# Patient Record
Sex: Female | Born: 1970 | Race: White | Hispanic: No | Marital: Married | Smoking: Former smoker
Health system: Southern US, Community
[De-identification: ages and names within clinical notes are randomized; demographics above are authoritative.]

## PROBLEM LIST (undated history)

## (undated) DIAGNOSIS — K219 Gastro-esophageal reflux disease without esophagitis: Secondary | ICD-10-CM

## (undated) DIAGNOSIS — O24419 Gestational diabetes mellitus in pregnancy, unspecified control: Secondary | ICD-10-CM

## (undated) DIAGNOSIS — IMO0002 Reserved for concepts with insufficient information to code with codable children: Secondary | ICD-10-CM

## (undated) DIAGNOSIS — T7840XA Allergy, unspecified, initial encounter: Secondary | ICD-10-CM

## (undated) DIAGNOSIS — R87619 Unspecified abnormal cytological findings in specimens from cervix uteri: Secondary | ICD-10-CM

## (undated) DIAGNOSIS — M1990 Unspecified osteoarthritis, unspecified site: Secondary | ICD-10-CM

## (undated) DIAGNOSIS — M8580 Other specified disorders of bone density and structure, unspecified site: Secondary | ICD-10-CM

## (undated) DIAGNOSIS — R7303 Prediabetes: Secondary | ICD-10-CM

## (undated) DIAGNOSIS — O09529 Supervision of elderly multigravida, unspecified trimester: Secondary | ICD-10-CM

## (undated) HISTORY — DX: Supervision of elderly multigravida, unspecified trimester: O09.529

## (undated) HISTORY — DX: Allergy, unspecified, initial encounter: T78.40XA

## (undated) HISTORY — DX: Prediabetes: R73.03

## (undated) HISTORY — DX: Other specified disorders of bone density and structure, unspecified site: M85.80

## (undated) HISTORY — PX: LEEP: SHX91

## (undated) HISTORY — DX: Unspecified osteoarthritis, unspecified site: M19.90

## (undated) HISTORY — PX: TUMOR REMOVAL: SHX12

## (undated) HISTORY — PX: APPENDECTOMY: SHX54

## (undated) HISTORY — PX: KNEE SURGERY: SHX244

---

## 2000-10-05 ENCOUNTER — Other Ambulatory Visit: Admission: RE | Admit: 2000-10-05 | Discharge: 2000-10-05 | Payer: Self-pay | Admitting: Obstetrics and Gynecology

## 2000-11-13 ENCOUNTER — Encounter (INDEPENDENT_AMBULATORY_CARE_PROVIDER_SITE_OTHER): Payer: Self-pay | Admitting: Specialist

## 2000-11-13 ENCOUNTER — Other Ambulatory Visit: Admission: RE | Admit: 2000-11-13 | Discharge: 2000-11-13 | Payer: Self-pay | Admitting: Obstetrics and Gynecology

## 2000-12-21 ENCOUNTER — Other Ambulatory Visit: Admission: RE | Admit: 2000-12-21 | Discharge: 2000-12-21 | Payer: Self-pay | Admitting: Obstetrics and Gynecology

## 2000-12-21 ENCOUNTER — Encounter (INDEPENDENT_AMBULATORY_CARE_PROVIDER_SITE_OTHER): Payer: Self-pay | Admitting: Specialist

## 2001-03-20 ENCOUNTER — Other Ambulatory Visit: Admission: RE | Admit: 2001-03-20 | Discharge: 2001-03-20 | Payer: Self-pay | Admitting: Obstetrics and Gynecology

## 2001-07-09 ENCOUNTER — Other Ambulatory Visit: Admission: RE | Admit: 2001-07-09 | Discharge: 2001-07-09 | Payer: Self-pay | Admitting: Obstetrics and Gynecology

## 2001-11-12 ENCOUNTER — Other Ambulatory Visit: Admission: RE | Admit: 2001-11-12 | Discharge: 2001-11-12 | Payer: Self-pay | Admitting: Obstetrics and Gynecology

## 2002-01-28 ENCOUNTER — Other Ambulatory Visit: Admission: RE | Admit: 2002-01-28 | Discharge: 2002-01-28 | Payer: Self-pay | Admitting: Obstetrics and Gynecology

## 2002-09-16 ENCOUNTER — Other Ambulatory Visit: Admission: RE | Admit: 2002-09-16 | Discharge: 2002-09-16 | Payer: Self-pay | Admitting: Obstetrics and Gynecology

## 2003-03-03 ENCOUNTER — Emergency Department (HOSPITAL_COMMUNITY): Admission: EM | Admit: 2003-03-03 | Discharge: 2003-03-03 | Payer: Self-pay | Admitting: Emergency Medicine

## 2003-03-03 ENCOUNTER — Encounter: Payer: Self-pay | Admitting: Internal Medicine

## 2003-03-04 ENCOUNTER — Other Ambulatory Visit: Admission: RE | Admit: 2003-03-04 | Discharge: 2003-03-04 | Payer: Self-pay | Admitting: Obstetrics and Gynecology

## 2003-04-04 ENCOUNTER — Encounter (INDEPENDENT_AMBULATORY_CARE_PROVIDER_SITE_OTHER): Payer: Self-pay | Admitting: *Deleted

## 2003-04-04 ENCOUNTER — Ambulatory Visit (HOSPITAL_COMMUNITY): Admission: RE | Admit: 2003-04-04 | Discharge: 2003-04-04 | Payer: Self-pay | Admitting: Obstetrics and Gynecology

## 2003-04-07 ENCOUNTER — Inpatient Hospital Stay (HOSPITAL_COMMUNITY): Admission: AD | Admit: 2003-04-07 | Discharge: 2003-04-07 | Payer: Self-pay | Admitting: Obstetrics and Gynecology

## 2003-04-07 ENCOUNTER — Encounter: Payer: Self-pay | Admitting: Obstetrics and Gynecology

## 2003-05-21 ENCOUNTER — Encounter: Payer: Self-pay | Admitting: Emergency Medicine

## 2003-05-21 ENCOUNTER — Ambulatory Visit (HOSPITAL_COMMUNITY): Admission: RE | Admit: 2003-05-21 | Discharge: 2003-05-21 | Payer: Self-pay | Admitting: Emergency Medicine

## 2003-08-28 ENCOUNTER — Inpatient Hospital Stay (HOSPITAL_COMMUNITY): Admission: AD | Admit: 2003-08-28 | Discharge: 2003-08-28 | Payer: Self-pay | Admitting: *Deleted

## 2003-11-02 ENCOUNTER — Emergency Department (HOSPITAL_COMMUNITY): Admission: EM | Admit: 2003-11-02 | Discharge: 2003-11-02 | Payer: Self-pay | Admitting: Emergency Medicine

## 2003-11-03 ENCOUNTER — Encounter: Admission: RE | Admit: 2003-11-03 | Discharge: 2003-11-03 | Payer: Self-pay | Admitting: Specialist

## 2003-11-04 ENCOUNTER — Emergency Department (HOSPITAL_COMMUNITY): Admission: AD | Admit: 2003-11-04 | Discharge: 2003-11-04 | Payer: Self-pay | Admitting: Family Medicine

## 2004-03-05 ENCOUNTER — Ambulatory Visit (HOSPITAL_COMMUNITY): Admission: RE | Admit: 2004-03-05 | Discharge: 2004-03-05 | Payer: Self-pay | Admitting: Urology

## 2004-03-05 ENCOUNTER — Ambulatory Visit (HOSPITAL_BASED_OUTPATIENT_CLINIC_OR_DEPARTMENT_OTHER): Admission: RE | Admit: 2004-03-05 | Discharge: 2004-03-05 | Payer: Self-pay | Admitting: Urology

## 2004-03-15 ENCOUNTER — Other Ambulatory Visit: Admission: RE | Admit: 2004-03-15 | Discharge: 2004-03-15 | Payer: Self-pay | Admitting: Obstetrics and Gynecology

## 2004-11-05 ENCOUNTER — Ambulatory Visit (HOSPITAL_COMMUNITY): Admission: RE | Admit: 2004-11-05 | Discharge: 2004-11-05 | Payer: Self-pay | Admitting: Obstetrics and Gynecology

## 2005-04-04 ENCOUNTER — Other Ambulatory Visit: Admission: RE | Admit: 2005-04-04 | Discharge: 2005-04-04 | Payer: Self-pay | Admitting: Obstetrics and Gynecology

## 2007-08-13 ENCOUNTER — Encounter (INDEPENDENT_AMBULATORY_CARE_PROVIDER_SITE_OTHER): Payer: Self-pay | Admitting: General Surgery

## 2007-08-13 ENCOUNTER — Ambulatory Visit (HOSPITAL_BASED_OUTPATIENT_CLINIC_OR_DEPARTMENT_OTHER): Admission: RE | Admit: 2007-08-13 | Discharge: 2007-08-13 | Payer: Self-pay | Admitting: General Surgery

## 2008-09-03 ENCOUNTER — Ambulatory Visit: Payer: Self-pay | Admitting: Family Medicine

## 2008-09-05 ENCOUNTER — Telehealth (INDEPENDENT_AMBULATORY_CARE_PROVIDER_SITE_OTHER): Payer: Self-pay | Admitting: *Deleted

## 2008-09-05 ENCOUNTER — Emergency Department (HOSPITAL_COMMUNITY): Admission: EM | Admit: 2008-09-05 | Discharge: 2008-09-05 | Payer: Self-pay | Admitting: Emergency Medicine

## 2008-09-24 ENCOUNTER — Ambulatory Visit (HOSPITAL_COMMUNITY): Admission: RE | Admit: 2008-09-24 | Discharge: 2008-09-24 | Payer: Self-pay | Admitting: Neurosurgery

## 2008-11-06 ENCOUNTER — Ambulatory Visit: Payer: Self-pay | Admitting: Family Medicine

## 2009-06-17 ENCOUNTER — Ambulatory Visit: Payer: Self-pay | Admitting: Family Medicine

## 2009-06-17 LAB — CONVERTED CEMR LAB
Bilirubin Urine: NEGATIVE
Blood in Urine, dipstick: NEGATIVE
Glucose, Urine, Semiquant: NEGATIVE
Ketones, urine, test strip: NEGATIVE
Nitrite: NEGATIVE
Protein, U semiquant: NEGATIVE
Specific Gravity, Urine: 1.01
Urobilinogen, UA: 0.2
WBC Urine, dipstick: NEGATIVE
pH: 7.5

## 2009-08-07 ENCOUNTER — Encounter (INDEPENDENT_AMBULATORY_CARE_PROVIDER_SITE_OTHER): Payer: Self-pay | Admitting: Emergency Medicine

## 2009-08-07 ENCOUNTER — Emergency Department (HOSPITAL_COMMUNITY): Admission: EM | Admit: 2009-08-07 | Discharge: 2009-08-07 | Payer: Self-pay | Admitting: Emergency Medicine

## 2009-08-07 ENCOUNTER — Ambulatory Visit: Payer: Self-pay | Admitting: Surgery

## 2009-08-07 ENCOUNTER — Ambulatory Visit: Payer: Self-pay | Admitting: Family Medicine

## 2009-08-07 DIAGNOSIS — M79609 Pain in unspecified limb: Secondary | ICD-10-CM | POA: Insufficient documentation

## 2009-09-18 ENCOUNTER — Ambulatory Visit (HOSPITAL_COMMUNITY): Admission: RE | Admit: 2009-09-18 | Discharge: 2009-09-18 | Payer: Self-pay | Admitting: Obstetrics and Gynecology

## 2009-09-18 ENCOUNTER — Encounter (INDEPENDENT_AMBULATORY_CARE_PROVIDER_SITE_OTHER): Payer: Self-pay | Admitting: Obstetrics and Gynecology

## 2009-12-22 ENCOUNTER — Ambulatory Visit: Payer: Self-pay | Admitting: Family Medicine

## 2009-12-23 LAB — CONVERTED CEMR LAB
ALT: 22 units/L (ref 0–35)
AST: 19 units/L (ref 0–37)
Albumin: 4 g/dL (ref 3.5–5.2)
Alkaline Phosphatase: 48 units/L (ref 39–117)
BUN: 11 mg/dL (ref 6–23)
CO2: 22 meq/L (ref 19–32)
Calcium: 8.5 mg/dL (ref 8.4–10.5)
Chloride: 102 meq/L (ref 96–112)
Cholesterol: 189 mg/dL (ref 0–200)
Creatinine, Ser: 0.58 mg/dL (ref 0.40–1.20)
Glucose, Bld: 89 mg/dL (ref 70–99)
HDL: 43 mg/dL (ref >39)
LDL Cholesterol: 98 mg/dL (ref 0–99)
Potassium: 4 meq/L (ref 3.5–5.3)
Sodium: 137 meq/L (ref 135–145)
TSH: 1.168 microintl units/mL (ref 0.350–4.500)
Total Bilirubin: 0.4 mg/dL (ref 0.3–1.2)
Total CHOL/HDL Ratio: 4.4
Total Protein: 7 g/dL (ref 6.0–8.3)
Triglycerides: 238 mg/dL — ABNORMAL HIGH (ref <150)
VLDL: 48 mg/dL — ABNORMAL HIGH (ref 0–40)

## 2010-02-11 ENCOUNTER — Ambulatory Visit: Payer: Self-pay | Admitting: Family Medicine

## 2010-02-11 DIAGNOSIS — R42 Dizziness and giddiness: Secondary | ICD-10-CM | POA: Insufficient documentation

## 2010-02-11 LAB — CONVERTED CEMR LAB: Beta hcg, urine, semiquantitative: NEGATIVE

## 2010-02-12 ENCOUNTER — Encounter: Payer: Self-pay | Admitting: Family Medicine

## 2010-02-12 LAB — CONVERTED CEMR LAB
ALT: 50 units/L — ABNORMAL HIGH (ref 0–35)
AST: 34 units/L (ref 0–37)
Albumin: 4.8 g/dL (ref 3.5–5.2)
Alkaline Phosphatase: 54 units/L (ref 39–117)
BUN: 10 mg/dL (ref 6–23)
Basophils Absolute: 0.1 10*3/uL (ref 0.0–0.1)
Basophils Relative: 1 % (ref 0–1)
CO2: 28 meq/L (ref 19–32)
Calcium: 9.2 mg/dL (ref 8.4–10.5)
Chloride: 103 meq/L (ref 96–112)
Creatinine, Ser: 0.62 mg/dL (ref 0.40–1.20)
Eosinophils Absolute: 0.2 10*3/uL (ref 0.0–0.7)
Eosinophils Relative: 3 % (ref 0–5)
Glucose, Bld: 112 mg/dL — ABNORMAL HIGH (ref 70–99)
HCT: 42.2 % (ref 36.0–46.0)
Hemoglobin: 13.9 g/dL (ref 12.0–15.0)
Lymphocytes Relative: 44 % (ref 12–46)
Lymphs Abs: 2.8 10*3/uL (ref 0.7–4.0)
MCHC: 32.9 g/dL (ref 30.0–36.0)
MCV: 88.3 fL (ref 78.0–100.0)
Monocytes Absolute: 0.5 10*3/uL (ref 0.1–1.0)
Monocytes Relative: 8 % (ref 3–12)
Neutro Abs: 2.8 10*3/uL (ref 1.7–7.7)
Neutrophils Relative %: 45 % (ref 43–77)
Platelets: 198 10*3/uL (ref 150–400)
Potassium: 4 meq/L (ref 3.5–5.3)
Preg, Serum: NEGATIVE
RBC: 4.78 M/uL (ref 3.87–5.11)
RDW: 12.5 % (ref 11.5–15.5)
Sodium: 140 meq/L (ref 135–145)
Total Bilirubin: 0.3 mg/dL (ref 0.3–1.2)
Total Protein: 8 g/dL (ref 6.0–8.3)
WBC: 6.3 10*3/uL (ref 4.0–10.5)

## 2010-02-15 LAB — CONVERTED CEMR LAB
HCV Ab: NEGATIVE
Hep A IgM: NEGATIVE
Hep B C IgM: NEGATIVE
Hepatitis B Surface Ag: NEGATIVE

## 2010-03-08 ENCOUNTER — Ambulatory Visit: Payer: Self-pay | Admitting: Family Medicine

## 2010-03-08 DIAGNOSIS — E785 Hyperlipidemia, unspecified: Secondary | ICD-10-CM | POA: Insufficient documentation

## 2010-03-08 DIAGNOSIS — E781 Pure hyperglyceridemia: Secondary | ICD-10-CM | POA: Insufficient documentation

## 2010-03-25 ENCOUNTER — Ambulatory Visit: Payer: Self-pay | Admitting: Family Medicine

## 2010-03-25 DIAGNOSIS — J209 Acute bronchitis, unspecified: Secondary | ICD-10-CM | POA: Insufficient documentation

## 2010-03-30 ENCOUNTER — Telehealth: Payer: Self-pay | Admitting: Family Medicine

## 2010-03-31 ENCOUNTER — Encounter: Admission: RE | Admit: 2010-03-31 | Discharge: 2010-03-31 | Payer: Self-pay | Admitting: Family Medicine

## 2010-12-28 NOTE — Assessment & Plan Note (Signed)
Summary: Dizziness   Vital Signs:  Patient profile:   40 year old female Height:      64 inches Weight:      177 pounds Pulse rate:   96 / minute Pulse (ortho):   104 / minute BP sitting:   125 / 86  (left arm) BP standing:   144 / 98 Cuff size:   regular  Vitals Entered By: Kathlene November (February 11, 2010 4:16 PM)  Serial Vital Signs/Assessments:  Time      Position  BP       Pulse  Resp  Temp     By 4:18 PM   Lying LA  129/85   83                    Kim Johnson 4:18 PM   Sitting   132/95   100                   Kim Johnson 4:18 PM   Standing  144/98   104                   Kathlene November  CC: dizziness, nausea, H/A elevated BP reading before lunch 142/101 p=119   Primary Care Provider:  Nani Gasser, MD  CC:  dizziness, nausea, and H/A elevated BP reading before lunch 142/101 p=119.  History of Present Illness: dizziness, nausea, H/A elevated BP reading before lunch 142/101 p=119.    Started on Sunday. Bp has been running high all week.  Had last period 2 months ago. Not taking any OTC meds or NSAIDs.  Ony using fish oil and MVI.  Feels nauseated in the AM. Feels better afer she eats.  Bupring alot.  More heartburn recently.  Felt dizzy when turned her head to the right while driving.  No ear pain or pressuer.  When gets a HA, her ears hurts and sometimes feels a pressure in her head. .  When walking feels like she if floating.  She started smoking about 1 months ago. No fever or recent URI sxs. Ovulated on  March 5th so wonders if she could be pregnant.   Current Medications (verified): 1)  None  Allergies (verified): 1)  ! * Mobic 2)  Sudafed  Comments:  Nurse/Medical Assistant: The patient's medications and allergies were reviewed with the patient and were updated in the Medication and Allergy Lists. Kathlene November (February 11, 2010 4:17 PM)  Past History:  Family History: Last updated: 11/06/2008 father CAD, high cholesterol mother healthy 2 sisters  healthy  Physical Exam  General:  Well-developed,well-nourished,in no acute distress; alert,appropriate and cooperative throughout examination Head:  Normocephalic and atraumatic without obvious abnormalities. No apparent alopecia or balding. Eyes:  No corneal or conjunctival inflammation noted. EOMI. Perrla.  Ears:  External ear exam shows no significant lesions or deformities.  Otoscopic examination reveals clear canals, tympanic membranes are intact bilaterally without bulging, retraction, inflammation or discharge. Hearing is grossly normal bilaterally. Nose:  External nasal examination shows no deformity or inflammation.  Mouth:  Oral mucosa and oropharynx without lesions or exudates.  Teeth in good repair. Lungs:  Normal respiratory effort, chest expands symmetrically. Lungs are clear to auscultation, no crackles or wheezes. Heart:  Normal rate and regular rhythm. S1 and S2 normal without gallop, murmur, click, rub or other extra sounds. Abdomen:  Bowel sounds positive,abdomen soft and non-tender without masses, organomegaly or hernias noted. Neurologic:  alert & oriented X3, cranial nerves II-XII intact,  and gait normal.  Neg Diks-Hallpike manuver.  Skin:  no rashes.   Cervical Nodes:  No lymphadenopathy noted Psych:  Cognition and judgment appear intact. Alert and cooperative with normal attention span and concentration. No apparent delusions, illusions, hallucinations   Impression & Recommendations:  Problem # 1:  DIZZINESS (ICD-780.4) Encouraged her to quit smoking.  Orthosatics are normal. Neg Diks hallpike. I think it is related to her High BP but not sure why her BP is elevated when it was normal at her last viisit. Will check labs to rule out infeciton or electrolyte imbalance. Had normal TSH 2 mo ago.  She is not takng any meds that should inc her BP.  Consider sinusitis thought certainly sxs not classic.  Will call wiht labs results.   Orders: T-CBC w/Diff  ([id number]) T-Comprehensive Metabolic Panel [id number]) T- * Misc. Laboratory test ([id number])  Other Orders: Urine Pregnancy Test  [id number])  Laboratory Results   Urine Tests  Date/Time Received: 02/11/2010 Date/Time Reported: 02/11/2010    Urine HCG: negative

## 2010-12-28 NOTE — Letter (Signed)
Summary: Out of Work  [facility]  [address], [address]   [place], [place] [address]   Phone: [phone]  Fax: [phone]    February 11, 2010   Employee:  PEARLEE ARVIZU    To Whom It May Concern:   For Medical reasons, please excuse the above named employee from work for the following dates:  Start:   02-11-2010  End:   02-15-2010  If you need additional information, please feel free to contact our office.         Sincerely,    Nani Gasser MD

## 2010-12-28 NOTE — Assessment & Plan Note (Signed)
Summary: Bronchitis   Vital Signs:  Patient profile:   40 year old female Height:      64 inches Weight:      174 pounds BMI:     29.97 O2 Sat:      97 % on Room air Temp:     98.5 degrees F oral Pulse rate:   130 / minute BP sitting:   114 / 78  (left arm) Cuff size:   regular  Vitals Entered By: Kathlene November (March 25, 2010 9:57 AM)  O2 Flow:  Room air CC: cough, H/A, facial pressure, sweats, when coughs her back hurts in lung area. Started on Monday   Primary Care Provider:  Nani Gasser, MD  CC:  cough, H/A, facial pressure, sweats, and when coughs her back hurts in lung area. Started on Monday.  History of Present Illness: cough, H/A, facial pressure, sweats, when coughs her back hurts in lung area. Started on Monday ( 4 days ago). Mild runny nose. Not sure if true fever.  Mild ST.  Last night had some loose stools x 3.  Took some loratidine and albuterol and did topical menthol. Some SOB with the cough and with activity.   Current Medications (verified): 1)  None  Allergies (verified): 1)  ! * Mobic 2)  Sudafed  Comments:  Nurse/Medical Assistant: The patient's medications and allergies were reviewed with the patient and were updated in the Medication and Allergy Lists. Kathlene November (March 25, 2010 9:59 AM)  Physical Exam  General:  Well-developed,well-nourished,in no acute distress; alert,appropriate and cooperative throughout examination. Hs a deep vibratory cough.  Head:  Normocephalic and atraumatic without obvious abnormalities. No apparent alopecia or balding. Eyes:  No corneal or conjunctival inflammation noted. EOMI. Perrla. Ears:  External ear exam shows no significant lesions or deformities.  Otoscopic examination reveals clear canals, tympanic membranes are intact bilaterally without bulging, retraction, inflammation or discharge. Hearing is grossly normal bilaterally. Nose:  External nasal examination shows no deformity or inflammation. Nasal  mucosa are pink and moist without lesions or exudates. Mouth:  Oral mucosa and oropharynx without lesions or exudates.  Teeth in good repair. Neck:  No deformities, masses, or tenderness noted. Lungs:  Normal respiratory effort, chest expands symmetrically. Coarse BS bilat. No wheeze or crackles. Heart:  Normal rate and regular rhythm. S1 and S2 normal without gallop, murmur, click, rub or other extra sounds. Pulses:  Radial 2+  Skin:  no rashes.   Cervical Nodes:  No lymphadenopathy noted Psych:  Cognition and judgment appear intact. Alert and cooperative with normal attention span and concentration. No apparent delusions, illusions, hallucinations   Impression & Recommendations:  Problem # 1:  BRONCHITIS, ACUTE (ICD-466.0) Explained that this could also be viral in which case the ABS will not help her sxs.  Her updated medication list for this problem includes:    Zithromax Z-pak 250 Mg Tabs (Azithromycin) .Marland Kitchen... Take as directed.    Xopenex Hfa 45 Mcg/act Aero (Levalbuterol tartrate) .Marland Kitchen... 2-4 puffs inhaled every 4-6 hours as needed    Hydrocodone-homatropine 5-1.5 Mg/29ml Syrp (Hydrocodone-homatropine) .Marland KitchenMarland KitchenMarland KitchenMarland Kitchen 5ml by mouth at bedtime as needed for cough  Take antibiotics and other medications as directed. Encouraged to push clear liquids, get enough rest, and take acetaminophen as needed. To be seen in 5-7 days if no improvement, sooner if worse.  Complete Medication List: 1)  Zithromax Z-pak 250 Mg Tabs (Azithromycin) .... Take as directed. 2)  Xopenex Hfa 45 Mcg/act Aero (Levalbuterol tartrate) .... 2-4 puffs  inhaled every 4-6 hours as needed 3)  Hydrocodone-homatropine 5-1.5 Mg/79ml Syrp (Hydrocodone-homatropine) .... 5ml by mouth at bedtime as needed for cough  Patient Instructions: 1)  If not better in one week, please call us.   Prescriptions: HYDROCODONE-HOMATROPINE 5-1.5 MG/5ML SYRP (HYDROCODONE-HOMATROPINE) 5ml by mouth at bedtime as needed for cough  #132ml x 0   Entered and  Authorized by:   Nani Gasser MD   Signed by:   Nani Gasser MD on 03/25/2010   Method used:   Printed then faxed to ...       [facility]  [address] [phone]* (retail)       [address] [place], [place]  [address]       Ph: [phone] or [phone]       Fax: [phone]   RxID:   [id number] [facility] HFA 45 MCG/ACT AERO (LEVALBUTEROL TARTRATE) 2-4 puffs inhaled every 4-6 hours as needed  #1 x 0   Entered and Authorized by:   Nani Gasser MD   Signed by:   Nani Gasser MD on 03/25/2010   Method used:   Electronically to        [facility] [phone]* (retail)       [address]       [place], [place]  [address]       Ph: [phone] or [phone]       Fax: [phone]   RxID:   [id number] ZITHROMAX Z-PAK 250 MG TABS (AZITHROMYCIN) Take as directed.  #1 pack x 0   Entered and Authorized by:   Nani Gasser MD   Signed by:   Nani Gasser MD on 03/25/2010   Method used:   [facility]ronically to        [facility] [phone]* (retail)       [address]       [place], [place]  [address]       Ph: [phone] or [phone]       Fax: [phone]   RxID:   [id number]

## 2010-12-28 NOTE — Assessment & Plan Note (Signed)
Summary: CPE   Vital Signs:  Patient profile:   40 year old female Height:      64 inches Weight:      175 pounds Pulse rate:   93 / minute BP sitting:   99 / 66  (left arm) Cuff size:   regular  Vitals Entered By: Kathlene November (December 22, 2009 8:22 AM) CC: CPE no pap   Primary Care Provider:  Nani Gasser, MD  CC:  CPE no pap.  History of Present Illness: Has eye exam scheduled in March, 2011. Hx of uterine polyps- benign. Several removed last year.  She is currently seeing an infertility specialist to try to get pregnant. Take occ takes a MVI but admits she is not very consistant with it.   Current Medications (verified): 1)  None  Allergies (verified): 1)  ! * Mobic 2)  Sudafed  Comments:  Nurse/Medical Assistant: The patient's medications and allergies were reviewed with the patient and were updated in the Medication and Allergy Lists. Kathlene November (December 22, 2009 8:23 AM)  Past History:  Past Surgical History: Last updated: 11/06/2008 R knee surgery appendectomy fatty tumor removed.  Family History: Last updated: 11/06/2008 father CAD, high cholesterol mother healthy 2 sisters healthy  Past Medical History: borderline cholesterol G1P0  Gyn - Dr. Huntley Dec - hx of utereine polyps -benign.   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  Quit smoking 2000. Rare ETOH. Exercises rarely.    Review of Systems       The patient complains of vision loss.  The patient denies anorexia, fever, weight loss, weight gain, decreased hearing, hoarseness, chest pain, syncope, dyspnea on exertion, peripheral edema, prolonged cough, headaches, hemoptysis, abdominal pain, melena, hematochezia, severe indigestion/heartburn, hematuria, incontinence, genital sores, muscle weakness, suspicious skin lesions, transient blindness, difficulty walking, depression, unusual weight change, abnormal bleeding, enlarged lymph  nodes, angioedema, breast masses, and testicular masses.    Physical Exam  General:  Well-developed,well-nourished,in no acute distress; alert,appropriate and cooperative throughout examination Head:  Normocephalic and atraumatic without obvious abnormalities. No apparent alopecia or balding. Eyes:  No corneal or conjunctival inflammation noted. EOMI. Perrla. Ears:  External ear exam shows no significant lesions or deformities.  Otoscopic examination reveals clear canals, tympanic membranes are intact bilaterally without bulging, retraction, inflammation or discharge. Hearing is grossly normal bilaterally. Nose:  External nasal examination shows no deformity or inflammation. Nasal mucosa are pink and moist without lesions or exudates. Mouth:  Oral mucosa and oropharynx without lesions or exudates.  Teeth in good repair.Mildy enlarged tonsils.  Neck:  No deformities, masses, or tenderness noted. Lungs:  Normal respiratory effort, chest expands symmetrically. Lungs are clear to auscultation, no crackles or wheezes. Heart:  Normal rate and regular rhythm. S1 and S2 normal without gallop, murmur, click, rub or other extra sounds. Abdomen:  Bowel sounds positive,abdomen soft and non-tender without masses, organomegaly or hernias noted. Msk:  No deformity or scoliosis noted of thoracic or lumbar spine.   Pulses:  R and L carotid,radial,dorsalis pedis and posterior tibial pulses are full and equal bilaterally Extremities:  No clubbing, cyanosis, edema, or deformity noted with normal full range of motion of all joints.   Neurologic:  No cranial nerve deficits noted. Station and gait are normal. DTRs are symmetrical throughout. Sensory, motor and coordinative functions appear intact. Skin:  no rashes.   Cervical Nodes:  No lymphadenopathy noted Psych:  Cognition and judgment appear intact. Alert and cooperative with  normal attention span and concentration. No apparent delusions, illusions,  hallucinations   Impression & Recommendations:  Problem # 1:  HEALTH MAINTENANCE EXAM (ICD-V70.0) Discussed really need Tdap but since she is actively trying to get pregnant will hold off. Encouraged her to be consistant with her MVI or PNV.  Due to recheck cholesterol with her history.   Due for screening labs. Will check cholesterol though if high not a candidate for statin since trying to get pregnant.  make sure getting adequate calcium intake and encourage more regular exercise. This will help with fertiliy as well.  Orders: T-Comprehensive Metabolic Panel [id number]) T-Lipid Profile [phone] [id number]) T-TSH [phone])

## 2010-12-28 NOTE — Assessment & Plan Note (Signed)
Summary: FU ON BP, LUQ pain/knot, etc   Vital Signs:  Patient profile:   40 year old female Height:      64 inches Weight:      177 pounds Pulse rate:   100 / minute BP sitting:   120 / 73  (left arm) Cuff size:   regular  Vitals Entered By: Kathlene November (March 08, 2010 3:44 PM) CC: followup BP, Hypertension Management   Primary Care Provider:  Nani Gasser, MD  CC:  followup BP and Hypertension Management.  History of Present Illness: Nticesa bulgeini the LUQ about 2 weeks ago. Only tender when preses on it.  Sticks out after she eats. Does have alot of heartburn.  Carries TUMS with her all the time. No change in bowel movements.  Can't eat spagetti or pizza. Avoiding caffeine since has the episode of HTN.   Hypertension History:      She denies headache, chest pain, palpitations, dyspnea with exertion, orthopnea, PND, peripheral edema, visual symptoms, neurologic problems, syncope, and side effects from treatment.  She notes no problems with any antihypertensive medication side effects.        Positive major cardiovascular risk factors include hyperlipidemia and hypertension.  Negative major cardiovascular risk factors include female age less than 16 years old and negative family history for ischemic heart disease.     Current Medications (verified): 1)  Hydrochlorothiazide 12.5 Mg Caps (Hydrochlorothiazide) .... Take 1 Tablet By Mouth Once A Day in The Am  Allergies (verified): 1)  ! * Mobic 2)  Sudafed  Comments:  Nurse/Medical Assistant: The patient's medications and allergies were reviewed with the patient and were updated in the Medication and Allergy Lists. Kathlene November (March 08, 2010 3:45 PM)  Social History: Reviewed history from 12/22/2009 and no changes required. 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  Quit smoking 2000. Rare ETOH. Exercises rarely.    Physical Exam  General:   Well-developed,well-nourished,in no acute distress; alert,appropriate and cooperative throughout examination Head:  Normocephalic and atraumatic without obvious abnormalities. No apparent alopecia or balding. Lungs:  Normal respiratory effort, chest expands symmetrically. Lungs are clear to auscultation, no crackles or wheezes. Heart:  Normal rate and regular rhythm. S1 and S2 normal without gallop, murmur, click, rub or other extra sounds. Abdomen:  soft, non-tender, normal bowel sounds, no distention, and no masses.   Skin:  no rashes.   Psych:  Cognition and judgment appear intact. Alert and cooperative with normal attention span and concentration. No apparent delusions, illusions, hallucinations   Impression & Recommendations:  Problem # 1:  DIZZINESS (ICD-780.4) Discussed that her BP looks great adn has always looked graet. Discused salt avoidance. will stop HCTZ for now and pt will follow her BPs at home. She is trying to get pregnant.   Problem # 2:  LUQ PAIN (ICD-789.02) Dsicussed likely GERD related as she does have alot of reflux signs. Trial of Protonoix for 10 days. Pt to call if not helping. Protonix is category B.   Problem # 3:  HYPERTRIGLYCERIDEMIA (ICD-272.1) Due to recheck since on Fish oil tabs now.  Orders: T-Triglycerides [id number])  Complete Medication List: 1)  Hydrochlorothiazide 12.5 Mg Caps (Hydrochlorothiazide) .... Take 1 tablet by mouth once a day in the am 2)  Protonix   Hypertension Assessment/Plan:      The patient's hypertensive risk group is category B: At least one risk factor (excluding diabetes) with no target organ damage.  Her calculated 10  year risk of coronary heart disease is 2 %.  Today's blood pressure is 120/73.  Her blood pressure goal is < 140/90.  Patient Instructions: 1)  Take the protonix about 20-30 minutes before breakfast once a day. Call if not better in 10 days.  2)  Keep an eye on blood pressure at home.

## 2010-12-28 NOTE — Progress Notes (Signed)
Summary: Cough still  Phone Note Call from Patient Call back at Work Phone [phone]   Caller: Patient Call For: Nani Gasser MD Summary of Call: Pt finished antibiotic yesterday. Cough is terrible during the day- dry cough- sounds like alot of head congestion still. Lungs are hurting. Cough med given at night works good during the night Initial call taken by: Kathlene November,  Mar 30, 2010 2:44 PM  Follow-up for Phone Call        Lets get CXRto rule out PNA>  Follow-up by: Nani Gasser MD,  Mar 30, 2010 3:03 PM  Additional Follow-up for Phone Call Additional follow up Details #1::        Pt notified Additional Follow-up by: Kathlene November,  Mar 30, 2010 3:13 PM

## 2011-01-19 LAB — ABO/RH: RH Type: POSITIVE

## 2011-01-19 LAB — RPR: RPR: NONREACTIVE

## 2011-01-19 LAB — HIV ANTIBODY (ROUTINE TESTING W REFLEX): HIV: NONREACTIVE

## 2011-01-19 LAB — RUBELLA ANTIBODY, IGM: Rubella: IMMUNE

## 2011-01-19 LAB — GC/CHLAMYDIA PROBE AMP, GENITAL
Chlamydia: NEGATIVE
Gonorrhea: NEGATIVE

## 2011-01-19 LAB — ANTIBODY SCREEN: Antibody Screen: NEGATIVE

## 2011-02-27 ENCOUNTER — Inpatient Hospital Stay (HOSPITAL_COMMUNITY): Admission: AD | Admit: 2011-02-27 | Payer: Self-pay | Source: Ambulatory Visit | Admitting: Obstetrics and Gynecology

## 2011-03-03 LAB — COMPREHENSIVE METABOLIC PANEL
ALT: 36 U/L — ABNORMAL HIGH (ref 0–35)
AST: 28 U/L (ref 0–37)
Albumin: 4 g/dL (ref 3.5–5.2)
Alkaline Phosphatase: 52 U/L (ref 39–117)
BUN: 9 mg/dL (ref 6–23)
CO2: 28 mEq/L (ref 19–32)
Calcium: 9.1 mg/dL (ref 8.4–10.5)
Chloride: 102 mEq/L (ref 96–112)
Creatinine, Ser: 0.49 mg/dL (ref 0.4–1.2)
GFR calc Af Amer: >60 mL/min (ref >60)
GFR calc non Af Amer: >60 mL/min (ref >60)
Glucose, Bld: 91 mg/dL (ref 70–99)
Potassium: 4.2 mEq/L (ref 3.5–5.1)
Sodium: 136 mEq/L (ref 135–145)
Total Bilirubin: 0.7 mg/dL (ref 0.3–1.2)
Total Protein: 7.4 g/dL (ref 6.0–8.3)

## 2011-03-03 LAB — CBC
HCT: 38.1 % (ref 36.0–46.0)
Hemoglobin: 13 g/dL (ref 12.0–15.0)
MCHC: 34.2 g/dL (ref 30.0–36.0)
MCV: 88.4 fL (ref 78.0–100.0)
Platelets: 194 10*3/uL (ref 150–400)
RBC: 4.31 MIL/uL (ref 3.87–5.11)
RDW: 12.8 % (ref 11.5–15.5)
WBC: 5.5 10*3/uL (ref 4.0–10.5)

## 2011-03-03 LAB — HCG, SERUM, QUALITATIVE: Preg, Serum: NEGATIVE

## 2011-04-12 NOTE — Op Note (Signed)
[name], Johnston NO.:  [id number]   MEDICAL RECORD NO.:  [id number]          PATIENT TYPE:  AMB   LOCATION:  [facility]                          FACILITY:  [facility]   PHYSICIAN:  Anselm Pancoast. Weatherly, M.D.DATE OF BIRTH:  24-Sep-1971   DATE OF PROCEDURE:  08/13/2007  DATE OF DISCHARGE:                               OPERATIVE REPORT   HISTORY:  Laura Johnston is a 40 year old female referred to me by Dr.  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  I measured it and it is definitely greater then 4  cm in size with a little raised area and I discussed with her I thought  I could remove with local anesthesia and would give her a little  sedation Valium 10 mg orally about an hour prior to the procedure.  She  was in agreement with this and she was also started on antibiotics,  Keflex, this morning because of the close proximity to the anus.  The  patient was taken, positioned on the OR table left side down, the right  knee brought up so we could kind of spread the buttocks and then we  prepped the area very wide with a solution so we could get the sterile  towels in this kind of difficult area to get to.  With the patient  comfortable, we then used about 15 mL of 1% Xylocaine with adrenaline  all total, first putting kind of a wheal around the area and then a  little incision where the actual incision would be and then made the  little incision.  She could not feel the incision and then separated  this lipoma that was definitely greater than 4 cm.  It was probably 7 or  8 cm in greatest dimension.  From the surrounding area, it goes down  close towards the anus but not actually to the actual anus or anything  deep.  Fortunately, there was not a lot of bleeding.  We did put a  couple sutures of 3-0 chromic down where the little vascular pedicle was  and then  after the area was excised, I closed the skin with simple  interrupted sutures of 4-0 nylon, applied antibiotic ointment over the  area.   The patient has arranged so she is off work this week since she sits on  the area, and we will give her Darvocet-N 100 for pain.  This area is  definitely a benign appearance but will have pathology exam.  She is  doing satisfactory.  She said she was quite anxious preoperatively and  she has another Valium and she wants to take it this afternoon.  She can  but only after she gets home and in bed.  Her mother is coming to be  with her for the next couple of days and we will see her in the office  in approximately a week for removal of the sutures.  She is aware that  she will have a little bit  of drainage possibly and may use a little,  like a sanitary pad in case there would be drainage in this fairly large  cavity that is close to the anus.           ______________________________  Anselm Pancoast. Zachery Dakins, M.D.     WJW/MEDQ  D:  08/13/2007  T:  08/13/2007  Job:  [id number]   cc:   Guy Sandifer. Henderson Cloud, M.D.

## 2011-04-15 NOTE — Op Note (Signed)
Laura Johnston, Laura Johnston                    ACCOUNT NO.:  [id number]   MEDICAL RECORD NO.:  [id number]                   PATIENT TYPE:  AMB   LOCATION:  SDC                                  FACILITY:  [facility]   PHYSICIAN:  Guy Sandifer. Arleta Creek, M.D.           DATE OF BIRTH:  [birth year]   DATE OF PROCEDURE:  04/04/2003  DATE OF DISCHARGE:                                 OPERATIVE REPORT   PREOPERATIVE DIAGNOSIS:  Missed abortion.   POSTOPERATIVE DIAGNOSIS:  Missed abortion.   PROCEDURE:  Dilatation and evacuation.   SURGEON:  Guy Sandifer. Henderson Cloud, M.D.   ANESTHESIA:  1. MAC.  2. 1% Xylocaine paracervical block.   ESTIMATED BLOOD LOSS:  Less than 50 mL.   SPECIMENS:  Products of conception.   INDICATIONS AND CONSENT:  This patient is a 40 year old female, G1, P0, with  a missed abortion.  Details are dictated in history and physical.  Dilatation and evacuation was discussed with the patient preoperatively.  Potential risks and complications were discussed preoperatively, including  but not limited to infection, uterine perforation, organ damage, bleeding  requiring transfusion of blood products and possible transfusion reaction,  HIV and hepatitis acquisition, DVT, PE, and pneumonia, hysterectomy,  laparotomy, laparoscopy.  All questions have been answered and consent is on  the chart.   DESCRIPTION OF PROCEDURE:  The patient was taken to the operating room and  placed in the dorsal supine position, where intravenous anesthetic is  administered.  She is then placed in the dorsal lithotomy position, where  she is gently prepped, straight-catheterized, and draped in a sterile  fashion.  Examination reveals the cervix to be closed, the uterus to be  retroverted, approximately six to eight weeks in size.  A bivalve speculum  is placed in the vagina and he anterior cervical lip is injected with 1%  Xylocaine and grasped with a single-tooth tenaculum.  Paracervical block is  then  placed at the 2, 4, 5, 7, 8, and 10 o'clock positions with  approximately 20 mL total of 1% Xylocaine.  The cervix is gently  progressively dilated to a 29 Pratt dilator.  A #7 curved curette is placed  in the uterine cavity and suction curettage is carried out for obvious  products of conception.  Ten units of Pitocin are added to the remaining 500  mL of IV fluids after the initial pass of the suction curette.  Alternating  suction and sharp curettage is carried out until the cavity is clean.  Good  hemostasis is noted.  The tenaculum is removed, all instruments are removed,  and all counts are correct.  The patient is taken to the recovery room in  stable condition.  She did receive IV antibiotics during the case.  Blood  type is A positive.  Guy Sandifer Arleta Creek, M.D.   JET/MEDQ  D:  04/04/2003  T:  04/05/2003  Job:  [id number]

## 2011-04-15 NOTE — H&P (Signed)
NAMELAVANA, Laura Johnston                    ACCOUNT NO.:  [id number]   MEDICAL RECORD NO.:  [id number]                   PATIENT TYPE:   LOCATION:                                       FACILITY:  [facility]   PHYSICIAN:  Guy Sandifer. Arleta Creek, M.D.           DATE OF BIRTH:  06/30/1971   DATE OF ADMISSION:  04/04/2003  DATE OF DISCHARGE:                                HISTORY & PHYSICAL   CHIEF COMPLAINT:  Missed abortion.   HISTORY OF PRESENT ILLNESS:  This patient is a 40 year old single female,  G1, P0, with an LMP of February 06, 2003 who was noted on March 27, 2003 to  have an intrauterine pregnancy with a fetal heart beat of 90 beats a minute.  She passed several blood clots and repeat ultrasound on Apr 03, 2003  revealed no cardiac activity of the fetal pole after prolonged observation.  The diagnosis of missed abortion was made.  Options were reviewed.  The  patient is being admitted for dilatation and evacuation.  The potential  risks and complications have been discussed with the patient and her  husband.  All questions have been answered.  The blood type is A positive.   PAST MEDICAL HISTORY:  1. Colitis.  2. Cervical dysplasia status post LEEP.   PAST SURGICAL HISTORY:  1. Appendectomy in 1995.  2. Arthroscopy of the left knee in 1996 and 1997.  3. Hysteroscopy and D&C for endometrial polyps, August 14, 2002.   FAMILY HISTORY:  Heart disease in father.  Hypothyroidism in sister.  Osteoporosis in mother.   SOCIAL HISTORY:  The patient denies tobacco, alcohol or drug abuse.   MEDICATIONS:  1. Amoxicillin.  2. Sudafed p.r.n.   ALLERGIES:  No known drug allergies.   REVIEW OF SYSTEMS:  GENITOURINARY:  Recent hematuria and started on  amoxicillin on Apr 02, 2003 with urine culture pending.  PULMONARY:  Environmental allergies.  CARDIAC:  No chest pain.  NEUROLOGIC:  Negative.   PHYSICAL EXAMINATION:  VITAL SIGNS:  Height 5 feet 3 inches.  Weight 148  pounds.  Blood  pressure 114/72.  LUNGS:  Clear to auscultation.  HEART:  Regular rate and rhythm.  BACK:  Without CVA tenderness.  BREASTS:  No examined.  ABDOMEN:  Soft and nontender without masses.  PELVIC EXAMINATION:  Vulva, vagina and cervix without lesions.  A small  amount of dark blood from the cervix is noted.  Cervix is closed and thick.  Uterus is six to eight weeks in size.  Adnexa nontender without masses.  EXTREMITIES:  Grossly within normal limits.  NEUROLOGIC EXAMINATION:  Grossly within normal limits.   ASSESSMENT:  Missed abortion.   PLAN:  Dilatation and evacuation.  Guy Sandifer Arleta Creek, M.D.    JET/MEDQ  D:  04/03/2003  T:  04/04/2003  Job:  [id number]

## 2011-05-26 ENCOUNTER — Inpatient Hospital Stay (HOSPITAL_COMMUNITY)
Admission: AD | Admit: 2011-05-26 | Discharge: 2011-05-26 | Disposition: A | Discharge status: Home / Self Care | Payer: BC Managed Care – PPO | Source: Ambulatory Visit | Attending: Obstetrics and Gynecology | Admitting: Obstetrics and Gynecology

## 2011-05-26 DIAGNOSIS — O99891 Other specified diseases and conditions complicating pregnancy: Secondary | ICD-10-CM | POA: Insufficient documentation

## 2011-05-26 DIAGNOSIS — R05 Cough: Secondary | ICD-10-CM | POA: Insufficient documentation

## 2011-05-26 DIAGNOSIS — J40 Bronchitis, not specified as acute or chronic: Secondary | ICD-10-CM | POA: Insufficient documentation

## 2011-05-26 DIAGNOSIS — R059 Cough, unspecified: Secondary | ICD-10-CM | POA: Insufficient documentation

## 2011-05-26 LAB — URINALYSIS, ROUTINE W REFLEX MICROSCOPIC
Bilirubin Urine: NEGATIVE
Glucose, UA: NEGATIVE mg/dL
Hgb urine dipstick: NEGATIVE
Ketones, ur: NEGATIVE mg/dL
Leukocytes, UA: NEGATIVE
Nitrite: NEGATIVE
Protein, ur: NEGATIVE mg/dL
Specific Gravity, Urine: 1.01 (ref 1.005–1.030)
Urobilinogen, UA: 0.2 mg/dL (ref 0.0–1.0)
pH: 6 (ref 5.0–8.0)

## 2011-06-12 ENCOUNTER — Inpatient Hospital Stay (HOSPITAL_COMMUNITY)
Admission: AD | Admit: 2011-06-12 | Discharge: 2011-06-12 | Disposition: A | Discharge status: Home / Self Care | Payer: BC Managed Care – PPO | Source: Ambulatory Visit | Attending: Obstetrics and Gynecology | Admitting: Obstetrics and Gynecology

## 2011-06-12 ENCOUNTER — Encounter (HOSPITAL_COMMUNITY): Payer: Self-pay | Admitting: *Deleted

## 2011-06-12 DIAGNOSIS — O368190 Decreased fetal movements, unspecified trimester, not applicable or unspecified: Secondary | ICD-10-CM | POA: Insufficient documentation

## 2011-06-12 NOTE — Progress Notes (Signed)
Pt presents to [facility] for decreased fetal movement.  efm and toco applied.  Assessing.

## 2011-06-12 NOTE — Progress Notes (Signed)
No pain.  Has not feel movement like normal for 2 days.  29wks.  G2p0 (miscarried)  No bleeding

## 2011-06-12 NOTE — Progress Notes (Signed)
Dr. Rana Snare notified of pt c/o decreased fetal movement.  Notified of reactive fetal strip with no ctx.  Notified of pt seen by mau provider. Orders received to dc home.

## 2011-06-12 NOTE — ED Notes (Signed)
N. Bascom Levels, CNM at bedside. Strip reviewed and POC discussed with pt.

## 2011-06-22 ENCOUNTER — Encounter: Payer: BC Managed Care – PPO | Attending: Obstetrics and Gynecology

## 2011-06-22 DIAGNOSIS — O99810 Abnormal glucose complicating pregnancy: Secondary | ICD-10-CM | POA: Insufficient documentation

## 2011-06-22 DIAGNOSIS — Z713 Dietary counseling and surveillance: Secondary | ICD-10-CM | POA: Insufficient documentation

## 2011-06-23 NOTE — Progress Notes (Signed)
  Patient was seen on 06/22/2011 for Gestational Diabetes self-management class at the [facility]. The following learning objectives were met by the patient during this course:   States the definition of Gestational Diabetes  States why dietary management is important in controlling blood glucose  Describes the effects each nutrient has on blood glucose levels  Demonstrates ability to create a balanced meal plan  Demonstrates carbohydrate counting   States when to check blood glucose levels  Demonstrates proper blood glucose monitoring techniques  States the effect of stress and exercise on blood glucose levels  States the importance of limiting caffeine and abstaining from alcohol and smoking  Blood glucose monitor given: Accu-Chek [employer] Monitoring System) Lot # [id number] Exp: 10/27/2012 Blood glucose reading: 82 @ 6:30 p.m.   Patient instructed to monitor glucose levels: Fasting and 2 hr. post-meal FBS: 60 - <90 1 hour: <140 2 hour: <120  Patient will be seen for follow-up as needed.

## 2011-07-22 LAB — STREP B DNA PROBE: GBS: NEGATIVE

## 2011-08-17 ENCOUNTER — Encounter (HOSPITAL_COMMUNITY): Payer: Self-pay | Admitting: *Deleted

## 2011-08-17 ENCOUNTER — Telehealth (HOSPITAL_COMMUNITY): Payer: Self-pay | Admitting: *Deleted

## 2011-08-17 NOTE — Telephone Encounter (Signed)
Preadmission screen  

## 2011-08-18 ENCOUNTER — Inpatient Hospital Stay (HOSPITAL_COMMUNITY)
Admission: RE | Admit: 2011-08-18 | Discharge: 2011-08-22 | DRG: 372 | Disposition: A | Discharge status: Home / Self Care | Payer: BC Managed Care – PPO | Source: Ambulatory Visit | Attending: Obstetrics and Gynecology | Admitting: Obstetrics and Gynecology

## 2011-08-18 ENCOUNTER — Other Ambulatory Visit: Payer: Self-pay | Admitting: Obstetrics and Gynecology

## 2011-08-18 DIAGNOSIS — G971 Other reaction to spinal and lumbar puncture: Secondary | ICD-10-CM | POA: Diagnosis not present

## 2011-08-18 DIAGNOSIS — O898 Other complications of anesthesia during the puerperium: Secondary | ICD-10-CM | POA: Diagnosis not present

## 2011-08-18 DIAGNOSIS — R42 Dizziness and giddiness: Secondary | ICD-10-CM

## 2011-08-18 DIAGNOSIS — E781 Pure hyperglyceridemia: Secondary | ICD-10-CM

## 2011-08-18 DIAGNOSIS — M79609 Pain in unspecified limb: Secondary | ICD-10-CM

## 2011-08-18 DIAGNOSIS — J209 Acute bronchitis, unspecified: Secondary | ICD-10-CM

## 2011-08-18 LAB — CBC
Hemoglobin: 11.1 g/dL — ABNORMAL LOW (ref 12.0–15.0)
MCHC: 33.3 g/dL (ref 30.0–36.0)
Platelets: 159 10*3/uL (ref 150–400)
RDW: 14.5 % (ref 11.5–15.5)

## 2011-08-18 MED ORDER — ONDANSETRON HCL 4 MG/2ML IJ SOLN: 4.0000 mg | Freq: Four times a day (QID) | INTRAMUSCULAR | Status: DC | PRN

## 2011-08-18 MED ORDER — ACETAMINOPHEN 325 MG PO TABS: 650.0000 mg | ORAL_TABLET | ORAL | Status: DC | PRN

## 2011-08-18 MED ORDER — LIDOCAINE HCL (PF) 1 % IJ SOLN
30.0000 mL | INTRAMUSCULAR | Status: DC | PRN
  Administered 2011-08-19: 30 mL via SUBCUTANEOUS
  Filled 2011-08-18: qty 30

## 2011-08-18 MED ORDER — FLEET ENEMA 7-19 GM/118ML RE ENEM: 1.0000 | ENEMA | RECTAL | Status: DC | PRN

## 2011-08-18 MED ORDER — OXYCODONE-ACETAMINOPHEN 5-325 MG PO TABS: 2.0000 | ORAL_TABLET | ORAL | Status: DC | PRN

## 2011-08-18 MED ORDER — ZOLPIDEM TARTRATE 10 MG PO TABS: 10.0000 mg | ORAL_TABLET | Freq: Every evening | ORAL | Status: DC | PRN

## 2011-08-18 MED ORDER — OXYTOCIN BOLUS FROM INFUSION
500.0000 mL | Freq: Once | INTRAVENOUS | Status: AC
  Administered 2011-08-19: 500 mL via INTRAVENOUS
  Filled 2011-08-18: qty 500

## 2011-08-18 MED ORDER — DINOPROSTONE 10 MG VA INST
10.0000 mg | VAGINAL_INSERT | Freq: Once | VAGINAL | Status: AC
  Administered 2011-08-18: 10 mg via VAGINAL
  Filled 2011-08-18: qty 1

## 2011-08-18 MED ORDER — OXYTOCIN 20 UNITS IN LACTATED RINGERS INFUSION - SIMPLE
125.0000 mL/h | Freq: Once | INTRAVENOUS | Status: AC
  Administered 2011-08-19: 125 mL/h via INTRAVENOUS

## 2011-08-18 MED ORDER — CITRIC ACID-SODIUM CITRATE 334-500 MG/5ML PO SOLN: 30.0000 mL | ORAL | Status: DC | PRN

## 2011-08-18 MED ORDER — TERBUTALINE SULFATE 1 MG/ML IJ SOLN: 0.2500 mg | Freq: Once | INTRAMUSCULAR | Status: AC | PRN

## 2011-08-18 MED ORDER — LACTATED RINGERS IV SOLN
INTRAVENOUS | Status: DC
  Administered 2011-08-18 – 2011-08-19 (×2): via INTRAVENOUS
  Administered 2011-08-19: 125 mL/h via INTRAVENOUS
  Administered 2011-08-19: 21:00:00 via INTRAVENOUS

## 2011-08-18 MED ORDER — LACTATED RINGERS IV SOLN
500.0000 mL | INTRAVENOUS | Status: DC | PRN
  Administered 2011-08-19: 1000 mL via INTRAVENOUS

## 2011-08-18 NOTE — H&P (Signed)
Laura Johnston is an 40 y.o. female. G2P0 EDC 08/23/11 placing her at 39 2/7 weeks with pregnancy complicated by AMA-normal first trimester screen, and gestational diabetes on glyburide 2.5mg , po, qhs. U/S on 08/03/11 was vtx with EFW of 6# 8 oz at 49%. Presents for induction of labor.  Pertinent Gynecological History: Menses: amenorheic during pregnancy Bleeding: dysfunctional uterine bleeding Contraception: none DES exposure: unknown Blood transfusions: none Sexually transmitted diseases: no past history Previous GYN Procedures: LEEP  Last mammogram: normal Date: 6/11 Last pap: normal Date: 3/12 OB History: G2, P0   Menstrual History: Menarche age: unknown No LMP recorded. Patient is pregnant.    Past Medical History  Diagnosis Date  . No pertinent past medical history   . AMA (advanced maternal age) multigravida 35+     Past Surgical History  Procedure Date  . Appendectomy   . Knee surgery   . 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 Stroke Father   . Hypertension Father   . Diabetes Maternal Uncle   . Stroke Paternal Grandmother     Social History:  reports that she has quit smoking. She does not have any smokeless tobacco history on file. She reports that she does not drink alcohol or use illicit drugs.  Allergies:  Allergies  Allergen Reactions  . Mobic     Paralysis  . Pseudoephedrine     REACTION: numbness to fingers/tongue     (Not in a hospital admission)  Review of Systems  Eyes: Negative for blurred vision.  Gastrointestinal: Negative for abdominal pain.  Neurological: Negative for headaches.    There were no vitals taken for this visit. Physical Exam  Constitutional: She appears well-developed and well-nourished.  Cardiovascular: Normal rate and regular rhythm.   Respiratory: Effort normal and breath sounds normal.    No results found for this or any previous visit (from the past 24  hour(s)).  No results found.  Assessment/Plan: 40 yo G2P0 at 60 2/7 weeks with gestational diabetes on glyburide for 2 stage induction of labor. Risks reviewed with patient in office.  Maripaz Mullan II,Zella Dewan E 08/18/2011, 5:54 PM

## 2011-08-19 ENCOUNTER — Encounter (HOSPITAL_COMMUNITY): Payer: Self-pay

## 2011-08-19 ENCOUNTER — Inpatient Hospital Stay (HOSPITAL_COMMUNITY): Payer: BC Managed Care – PPO | Admitting: Anesthesiology

## 2011-08-19 ENCOUNTER — Encounter (HOSPITAL_COMMUNITY): Payer: Self-pay | Admitting: Anesthesiology

## 2011-08-19 LAB — RPR: RPR Ser Ql: NONREACTIVE

## 2011-08-19 LAB — GLUCOSE, CAPILLARY
Glucose-Capillary: 117 mg/dL — ABNORMAL HIGH (ref 70–99)
Glucose-Capillary: 83 mg/dL (ref 70–99)

## 2011-08-19 MED ORDER — NALBUPHINE SYRINGE 5 MG/0.5 ML
INJECTION | INTRAMUSCULAR | Status: AC
  Filled 2011-08-19: qty 0.5

## 2011-08-19 MED ORDER — PHENYLEPHRINE 40 MCG/ML (10ML) SYRINGE FOR IV PUSH (FOR BLOOD PRESSURE SUPPORT): 80.0000 ug | PREFILLED_SYRINGE | INTRAVENOUS | Status: DC | PRN

## 2011-08-19 MED ORDER — EPHEDRINE 5 MG/ML INJ: 10.0000 mg | INTRAVENOUS | Status: DC | PRN

## 2011-08-19 MED ORDER — LACTATED RINGERS IV SOLN: 500.0000 mL | Freq: Once | INTRAVENOUS | Status: DC

## 2011-08-19 MED ORDER — FENTANYL 2.5 MCG/ML BUPIVACAINE 1/10 % EPIDURAL INFUSION (WH - ANES)
14.0000 mL/h | INTRAMUSCULAR | Status: DC
  Administered 2011-08-19 (×3): 14 mL/h via EPIDURAL
  Filled 2011-08-19 (×3): qty 60

## 2011-08-19 MED ORDER — TERBUTALINE SULFATE 1 MG/ML IJ SOLN: 0.2500 mg | Freq: Once | INTRAMUSCULAR | Status: AC | PRN

## 2011-08-19 MED ORDER — LIDOCAINE HCL 1.5 % IJ SOLN
INTRAMUSCULAR | Status: DC | PRN
  Administered 2011-08-19 (×2): 5 mL via EPIDURAL

## 2011-08-19 MED ORDER — DIPHENHYDRAMINE HCL 50 MG/ML IJ SOLN: 12.5000 mg | INTRAMUSCULAR | Status: DC | PRN

## 2011-08-19 MED ORDER — OXYTOCIN 20 UNITS IN LACTATED RINGERS INFUSION - SIMPLE
1.0000 m[IU]/min | INTRAVENOUS | Status: DC
  Administered 2011-08-19: 1 m[IU]/min via INTRAVENOUS
  Administered 2011-08-19: 5 m[IU]/min via INTRAVENOUS
  Filled 2011-08-19: qty 1000

## 2011-08-19 MED ORDER — NALBUPHINE HCL 10 MG/ML IJ SOLN
5.0000 mg | Freq: Once | INTRAMUSCULAR | Status: AC
  Administered 2011-08-19: 5 mg via INTRAVENOUS

## 2011-08-19 MED ORDER — CEFAZOLIN SODIUM 1-5 GM-% IV SOLN
1.0000 g | Freq: Once | INTRAVENOUS | Status: AC
  Administered 2011-08-19: 1 g via INTRAVENOUS
  Filled 2011-08-19: qty 50

## 2011-08-19 MED ORDER — EPHEDRINE 5 MG/ML INJ
10.0000 mg | INTRAVENOUS | Status: DC | PRN
  Filled 2011-08-19: qty 4

## 2011-08-19 NOTE — Progress Notes (Signed)
08/19/11 1952  Provider Notification  Provider Name/Title Leslie Andrea, MD  Method of Notification At bedside  Provider wants pt to labor down

## 2011-08-19 NOTE — Progress Notes (Signed)
FHT reactive Cx 6/C/0

## 2011-08-19 NOTE — Progress Notes (Signed)
08/19/11 2101  Provider Notification  Provider Name/Title Leslie Andrea, MD  Method of Notification At bedside  Provider wants pt to start pushing

## 2011-08-19 NOTE — Plan of Care (Signed)
Problem: Consults Goal: Birthing Suites Patient Information Press F2 to bring up selections list  Outcome: Completed/Met Date Met:  08/19/11  Pt 37-[redacted] weeks EGA, Inpatient induction and Diabetic

## 2011-08-19 NOTE — Progress Notes (Signed)
Delivery Second stage more than 3 hours, pt tired D/W options VE D/W-risks Foley cath removed Kiwi VE-good progress, 2 pop offs VMI Apgars 9/9 Wt 7# 2oz  Art pH 7.29 Placenta manually extracted-stuck along one edge, cavity feels clean, uterus involutes well, good hemostasis 3 degree MLE repaired, rectum checked-intact Ancef IV Pt/infant stable in LDR

## 2011-08-19 NOTE — Progress Notes (Signed)
FHT reactive Cervidil in UCs q3-4 min moderate VSS afeb

## 2011-08-19 NOTE — Anesthesia Preprocedure Evaluation (Signed)
Anesthesia Evaluation  Name, MR# and DOB Patient awake  General Assessment Comment  Reviewed: Allergy & Precautions, H&P , Patient's Chart, lab work & pertinent test results  Airway Mallampati: II TM Distance: >3 FB Neck ROM: full    Dental No notable dental hx.    Pulmonary  clear to auscultation  pulmonary exam normalPulmonary Exam Normal breath sounds clear to auscultation none    Cardiovascular regular Normal    Neuro/Psych Negative Neurological ROS  Negative Psych ROS  GI/Hepatic/Renal negative GI ROS  negative Liver ROS  negative Renal ROS        Endo/Other  Negative Endocrine ROS (+) Diabetes mellitus-,     Abdominal   Musculoskeletal   Hematology negative hematology ROS (+)   Peds  Reproductive/Obstetrics (+) Pregnancy    Anesthesia Other Findings             Anesthesia Physical Anesthesia Plan  ASA: III  Anesthesia Plan: Epidural   Post-op Pain Management:    Induction:   Airway Management Planned:   Additional Equipment:   Intra-op Plan:   Post-operative Plan:   Informed Consent: I have reviewed the patients History and Physical, chart, labs and discussed the procedure including the risks, benefits and alternatives for the proposed anesthesia with the patient or authorized representative who has indicated his/her understanding and acceptance.     Plan Discussed with:   Anesthesia Plan Comments:         Anesthesia Quick Evaluation

## 2011-08-19 NOTE — Progress Notes (Signed)
Dr. Henderson Cloud notified of pt status, SVE, FHR, and UC pattern.  Orders received, will continue to monitor.

## 2011-08-19 NOTE — Progress Notes (Signed)
08/19/11 0020  Peripheral IV 08/18/11 Right Wrist  Removal Date/Time: 08/19/11 0420  Placement Date/Time: 08/18/11 2220   Size (Gauge): 18 G  Orientation: Right  Location: Wrist  Site Prep: Chlorhexidine  Inserted By: Angela Nevin, rn  Insertion attempts: 2  Patient Tolerance: Tolerated well  Removal Reason   Site Assessment Clean;Dry;Intact  Dressing Intervention New dressing   C/o of pain at site. No sign of infiltration noted. No keep assessing site.

## 2011-08-19 NOTE — Anesthesia Procedure Notes (Signed)
Epidural Patient location during procedure: OB Start time: 08/19/2011 2:17 PM  Staffing Performed by: anesthesiologist   Preanesthetic Checklist Completed: patient identified, site marked, surgical consent, pre-op evaluation, timeout performed, IV checked, risks and benefits discussed and monitors and equipment checked  Epidural Patient position: sitting Prep: site prepped and draped and DuraPrep Patient monitoring: continuous pulse ox and blood pressure Approach: midline Injection technique: LOR saline  Needle:  Needle type: Tuohy  Needle gauge: 17 G Needle length: 9 cm Needle insertion depth: 5 cm cm Catheter type: closed end flexible Catheter size: 19 Gauge Catheter at skin depth: 10 cm Test dose: negative  Assessment Events: blood not aspirated, injection not painful, no injection resistance, negative IV test and no paresthesia  Additional Notes Patient identified.  Risk benefits discussed including failed block, incomplete pain control, headache, nerve damage, paralysis, blood pressure changes, nausea, vomiting, reactions to medication both toxic or allergic, and postpartum back pain.  Patient expressed understanding and wished to proceed.  All questions were answered.  Sterile technique used throughout procedure and epidural site dressed with sterile barrier dressing. With first attempt there was a mild parasthesia.The patient did not experience any signs of intravascular injection such as tinnitus or metallic taste in mouth nor signs of intrathecal spread such as rapid motor block.  There was a very slight trickle that persisted longer than I would expect.  The fluid was not warm to the touch, but in light of mild parasthesia and potential CSF leak, I elected to place it at a level lower. Please see nursing notes for vital signs.  Note: second epidural placed at L3-L4  Everything went perfectly on the level below.  No complications.

## 2011-08-19 NOTE — Progress Notes (Signed)
Dr. Henderson Cloud at bedside and notified of pt status, FHR, UC pattern, and CGBs.  Will continue to monitor.

## 2011-08-19 NOTE — Progress Notes (Signed)
08/19/11 1931  Pain Management  Labor Response Pushing with contractions  Pushing instructions explained to pt with understanding

## 2011-08-19 NOTE — Progress Notes (Signed)
FHT reactive Cx C/C/+2 Begin second stage

## 2011-08-19 NOTE — Progress Notes (Signed)
Pt c/o of pain at IV site. No sign of infiltration noted. Pt request for IV site. To be changed. IV Dc

## 2011-08-19 NOTE — Progress Notes (Signed)
FHT reactive UC q 2-3 minutes Pit on S/P Nubain about 10 minutes ago-inadequate pain relief Cx 1/C/-1 Epidural prn

## 2011-08-20 ENCOUNTER — Encounter (HOSPITAL_COMMUNITY): Payer: Self-pay

## 2011-08-20 LAB — CBC
HCT: 32 % — ABNORMAL LOW (ref 36.0–46.0)
Hemoglobin: 9.6 g/dL — ABNORMAL LOW (ref 12.0–15.0)
MCH: 29.9 pg (ref 26.0–34.0)
MCH: 30.5 pg (ref 26.0–34.0)
MCHC: 33.9 g/dL (ref 30.0–36.0)
MCV: 90.1 fL (ref 78.0–100.0)
MCV: 89.8 fL (ref 78.0–100.0)
Platelets: 132 10*3/uL — ABNORMAL LOW (ref 150–400)
RBC: 3.55 MIL/uL — ABNORMAL LOW (ref 3.87–5.11)
RBC: 3.15 MIL/uL — ABNORMAL LOW (ref 3.87–5.11)
WBC: 12.8 10*3/uL — ABNORMAL HIGH (ref 4.0–10.5)

## 2011-08-20 MED ORDER — IBUPROFEN 600 MG PO TABS
600.0000 mg | ORAL_TABLET | Freq: Four times a day (QID) | ORAL | Status: DC
  Administered 2011-08-20 – 2011-08-22 (×9): 600 mg via ORAL
  Filled 2011-08-20 (×10): qty 1

## 2011-08-20 MED ORDER — ONDANSETRON HCL 4 MG/2ML IJ SOLN: 4.0000 mg | INTRAMUSCULAR | Status: DC | PRN

## 2011-08-20 MED ORDER — LANOLIN HYDROUS EX OINT: TOPICAL_OINTMENT | CUTANEOUS | Status: DC | PRN

## 2011-08-20 MED ORDER — TETANUS-DIPHTH-ACELL PERTUSSIS 5-2.5-18.5 LF-MCG/0.5 IM SUSP
0.5000 mL | Freq: Once | INTRAMUSCULAR | Status: AC
  Administered 2011-08-20: 0.5 mL via INTRAMUSCULAR
  Filled 2011-08-20 (×2): qty 0.5

## 2011-08-20 MED ORDER — ONDANSETRON HCL 4 MG PO TABS: 4.0000 mg | ORAL_TABLET | ORAL | Status: DC | PRN

## 2011-08-20 MED ORDER — WITCH HAZEL-GLYCERIN EX PADS: 1.0000 "application " | MEDICATED_PAD | CUTANEOUS | Status: DC | PRN

## 2011-08-20 MED ORDER — PRENATAL PLUS 27-1 MG PO TABS
1.0000 | ORAL_TABLET | Freq: Every day | ORAL | Status: DC
  Administered 2011-08-20: 1 via ORAL
  Filled 2011-08-20 (×2): qty 1

## 2011-08-20 MED ORDER — DIBUCAINE 1 % RE OINT
1.0000 "application " | TOPICAL_OINTMENT | RECTAL | Status: DC | PRN
  Filled 2011-08-20 (×2): qty 28

## 2011-08-20 MED ORDER — SIMETHICONE 80 MG PO CHEW: 80.0000 mg | CHEWABLE_TABLET | ORAL | Status: DC | PRN

## 2011-08-20 MED ORDER — ZOLPIDEM TARTRATE 5 MG PO TABS: 5.0000 mg | ORAL_TABLET | Freq: Every evening | ORAL | Status: DC | PRN

## 2011-08-20 MED ORDER — BENZOCAINE-MENTHOL 20-0.5 % EX AERO
1.0000 "application " | INHALATION_SPRAY | CUTANEOUS | Status: DC | PRN
  Administered 2011-08-20: 1 via TOPICAL
  Filled 2011-08-20: qty 56

## 2011-08-20 MED ORDER — DIPHENHYDRAMINE HCL 25 MG PO CAPS
25.0000 mg | ORAL_CAPSULE | Freq: Four times a day (QID) | ORAL | Status: DC | PRN
Start: 2011-08-20 — End: 2011-08-22

## 2011-08-20 MED ORDER — CEFAZOLIN SODIUM 1-5 GM-% IV SOLN
1.0000 g | Freq: Four times a day (QID) | INTRAVENOUS | Status: AC
  Administered 2011-08-20 (×2): 1 g via INTRAVENOUS
  Filled 2011-08-20 (×2): qty 50

## 2011-08-20 MED ORDER — SENNOSIDES-DOCUSATE SODIUM 8.6-50 MG PO TABS
2.0000 | ORAL_TABLET | Freq: Every day | ORAL | Status: DC
  Administered 2011-08-20 – 2011-08-21 (×2): 2 via ORAL

## 2011-08-20 MED ORDER — OXYCODONE-ACETAMINOPHEN 5-325 MG PO TABS
1.0000 | ORAL_TABLET | ORAL | Status: DC | PRN
  Administered 2011-08-20 (×4): 2 via ORAL
  Administered 2011-08-21: 1 via ORAL
  Administered 2011-08-21: 2 via ORAL
  Administered 2011-08-22 (×4): 1 via ORAL
  Filled 2011-08-20 (×2): qty 1
  Filled 2011-08-20 (×4): qty 2
  Filled 2011-08-20: qty 1
  Filled 2011-08-20: qty 2
  Filled 2011-08-20 (×2): qty 1

## 2011-08-20 MED ORDER — BISACODYL 10 MG RE SUPP
10.0000 mg | Freq: Every day | RECTAL | Status: DC | PRN
  Filled 2011-08-20: qty 1

## 2011-08-20 MED ORDER — FLEET ENEMA 7-19 GM/118ML RE ENEM: 1.0000 | ENEMA | RECTAL | Status: DC | PRN

## 2011-08-20 NOTE — Progress Notes (Signed)
08/19/11 2320  Vital Signs  BP ! 85/42 mmHg  Pulse Rate ! 142   Pt c/o of dizziness, nausea, feeling cold and shivering. VS at above. Pt placed in supine position, vomited. Will continue to assess

## 2011-08-20 NOTE — Progress Notes (Signed)
08/19/11 2330  Vital Signs  BP ! 112/94 mmHg  Pulse Rate ! 143   pt states she starting to feel better.

## 2011-08-20 NOTE — Progress Notes (Signed)
Provider discuss benefit and risk of vacuum delivery with pt. Pt agree with vacuum extraction

## 2011-08-20 NOTE — Progress Notes (Signed)
Good pain relief Lochia decreasing  VSS Afeb FFNT  Satisfactory

## 2011-08-20 NOTE — Progress Notes (Signed)
Pt given consents to review and sign/Hep B, breastfeeding and infant safety.

## 2011-08-21 ENCOUNTER — Encounter (HOSPITAL_COMMUNITY): Payer: Self-pay | Admitting: Anesthesiology

## 2011-08-21 ENCOUNTER — Other Ambulatory Visit (HOSPITAL_COMMUNITY): Payer: BC Managed Care – PPO | Admitting: Pharmacist

## 2011-08-21 ENCOUNTER — Inpatient Hospital Stay (HOSPITAL_COMMUNITY): Payer: BC Managed Care – PPO | Admitting: Anesthesiology

## 2011-08-21 MED ORDER — FENTANYL CITRATE 0.05 MG/ML IJ SOLN
INTRAMUSCULAR | Status: AC
  Filled 2011-08-21: qty 2

## 2011-08-21 MED ORDER — BENZOCAINE-MENTHOL 20-0.5 % EX AERO
INHALATION_SPRAY | CUTANEOUS | Status: AC
  Filled 2011-08-21: qty 56

## 2011-08-21 MED ORDER — BUTALBITAL-APAP-CAFFEINE 50-325-40 MG PO TABS
1.0000 | ORAL_TABLET | ORAL | Status: DC | PRN
  Filled 2011-08-21: qty 1

## 2011-08-21 MED ORDER — MIDAZOLAM HCL 2 MG/2ML IJ SOLN
2.0000 mg | Freq: Once | INTRAMUSCULAR | Status: AC
  Administered 2011-08-21: 2 mg via INTRAVENOUS

## 2011-08-21 MED ORDER — FENTANYL CITRATE 0.05 MG/ML IJ SOLN
100.0000 ug | Freq: Once | INTRAMUSCULAR | Status: AC
  Administered 2011-08-21: 100 ug via INTRAVENOUS

## 2011-08-21 MED ORDER — MIDAZOLAM HCL 2 MG/2ML IJ SOLN
INTRAMUSCULAR | Status: AC
  Filled 2011-08-21: qty 2

## 2011-08-21 MED ORDER — LACTATED RINGERS IV SOLN
INTRAVENOUS | Status: DC
  Administered 2011-08-21: 13:00:00 via INTRAVENOUS

## 2011-08-21 NOTE — Anesthesia Postprocedure Evaluation (Signed)
  Anesthesia Post-op Note  Patient: Laura Johnston  Procedure(s) Performed: * Lumbar Epidural Blood Patch *  Patient Location: PACU  Anesthesia Type: MAC  Level of Consciousness: awake, alert  and oriented  Airway and Oxygen Therapy: Patient Spontanous Breathing  Post-op Pain: mild, moderate  Post-op Assessment: Post-op Vital signs reviewed, Patient's Cardiovascular Status Stable, Respiratory Function Stable, Patent Airway, No signs of Nausea or vomiting, Pain level controlled, No headache, No residual numbness and No residual motor weakness. She does complain of low back pain and upper leg discomfort.  Post-op Vital Signs: Reviewed and stable  Complications: No apparent anesthesia complications

## 2011-08-21 NOTE — Anesthesia Postprocedure Evaluation (Signed)
  Anesthesia Post-op Note  Patient: Laura Johnston  Procedure(s) Performed: * No procedures listed *  Patient Location: PACU and Mother/Baby  Anesthesia Type: Epidural  Level of Consciousness: awake, alert  and oriented  Airway and Oxygen Therapy: Patient Spontanous Breathing   Post-op Assessment: Patient's Cardiovascular Status Stable and Respiratory Function Stable  Post-op Vital Signs: stable  Complications: No apparent anesthesia complications

## 2011-08-21 NOTE — Progress Notes (Signed)
C/O HA when up, better when lying down Decreasing lochia Enuresis x 1, otherwise voiding well  VSS Afeb FFNT  A:PP with satisfactory progress     Post procedure spinal HA  P: observe today to evaluate HA     Blood patch prn per Dr Delsa Grana note

## 2011-08-21 NOTE — Progress Notes (Signed)
Evaluated patient with possible wet tap during epidural placement on Friday.  Patient was standing at bedside when I arrived to evaluate her.  She does endorse an occipital/nuchal headache that is worse when sitting or standing and better lying down.  Says the headache is "terrible".  Has reported it to her nurses and has been receiving percocet which does relieve the headache.  She denies blurry vision or other neuro symptoms.  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  Discussed natural course of PDPH - most resolve spontaneously within 7 days without intervention.  Discussed lying down when possible, increasing PO fluid intake (esp of caffeinated liquids), and taking Fioricet as needed for headache.  Discussed EBP for definitive treatment of PDPH.  Reassured patient that she can return to [facility] if headache persists after discharge and still receive EBP.    -Ordered fioricet. -Patient wishes to consider options and discuss with family in the morning. -Will continue to follow  A. Rodman Pickle, MD

## 2011-08-21 NOTE — Anesthesia Preprocedure Evaluation (Signed)
Anesthesia Evaluation  Name, MR# and DOB Patient awake  General Assessment Comment  Reviewed: Allergy & Precautions, H&P , Patient's Chart, lab work & pertinent test results  Airway Mallampati: III TM Distance: >3 FB Neck ROM: full    Dental No notable dental hx. (+) Teeth Intact   Pulmonary  clear to auscultation  pulmonary exam normalPulmonary Exam Normal breath sounds clear to auscultation none    Cardiovascular regular Normal    Neuro/Psych Patient has classical symptoms of Post-Dural Puncture Headache. Nuchal rigidity, hearing dysfunction, mild photophobia, headache positional in nature-relieved by supine position.  Negative Neurological ROS  Negative Psych ROS  GI/Hepatic/Renal negative GI ROS  negative Liver ROS  negative Renal ROS        Endo/Other  Negative Endocrine ROS (+)      Abdominal   Musculoskeletal   Hematology negative hematology ROS (+)   Peds  Reproductive/Obstetrics negative OB ROS    Anesthesia Other Findings Discussed risks, benefits and alternatives of Lumbar epidural blood patch at length with patient. She wishes to proceed with LEBP today.            Anesthesia Physical Anesthesia Plan  ASA: II  Anesthesia Plan: Epidural   Post-op Pain Management:    Induction:   Airway Management Planned:   Additional Equipment:   Intra-op Plan:   Post-operative Plan:   Informed Consent: I have reviewed the patients History and Physical, chart, labs and discussed the procedure including the risks, benefits and alternatives for the proposed anesthesia with the patient or authorized representative who has indicated his/her understanding and acceptance.     Plan Discussed with: Anesthesiologist and CRNA  Anesthesia Plan Comments:         Anesthesia Quick Evaluation

## 2011-08-21 NOTE — Anesthesia Procedure Notes (Addendum)
Epidural  Start time: 08/21/2011 12:42 PM End time: 08/21/2011 12:59 PM  Staffing Anesthesiologist: Cashay Manganelli A. Performed by: anesthesiologist   Preanesthetic Checklist Completed: patient identified, site marked, surgical consent, pre-op evaluation, timeout performed, IV checked, risks and benefits discussed and monitors and equipment checked  Epidural Patient position: sitting Prep: site prepped and draped and DuraPrep Patient monitoring: continuous pulse ox and blood pressure Approach: midline Injection technique: LOR air  Needle:  Needle type: Tuohy  Needle gauge: 17 G Needle length: 9 cm Needle insertion depth: 5 cm  Assessment Events: blood not aspirated, injection not painful, no injection resistance and no paresthesia  Additional Notes 20ml whole blood drawn by Doreene Burke, CRNA from Left AC vein in sterile fashion. 19ml of Whole blood injected through epidural needle until patient complained of low back discomfort and leg discomfort. Patient placed in supine position after procedure and reported relief of her headache. She tolerated the procedure well. She will be observed for 1 hour in the PACU then discharged back to her room.

## 2011-08-21 NOTE — Anesthesia Postprocedure Evaluation (Signed)
  Anesthesia Post-op Note  Patient: Laura Johnston  Procedure(s) Performed: * No procedures listed *  Patient Location: PACU and Mother/Baby  Anesthesia Type: Epidural  Level of Consciousness: awake, alert  and oriented  Airway and Oxygen Therapy: Patient Spontanous Breathing   Post-op Assessment: Patient's Cardiovascular Status Stable and Respiratory Function Stable  Post-op Vital Signs: stable  Complications: postural headache

## 2011-08-22 MED ORDER — IBUPROFEN 600 MG PO TABS: 600.0000 mg | ORAL_TABLET | Freq: Four times a day (QID) | ORAL | Status: AC

## 2011-08-22 MED ORDER — OXYCODONE-ACETAMINOPHEN 5-325 MG PO TABS: 1.0000 | ORAL_TABLET | ORAL | Status: AC | PRN

## 2011-08-22 NOTE — Discharge Summary (Signed)
Obstetric Discharge Summary Reason for Admission: induction of labor Prenatal Procedures: ultrasound Intrapartum Procedures: spontaneous vaginal delivery Postpartum Procedures: none Complications-Operative and Postpartum: 3 degree perineal laceration Hemoglobin  Date Value Range Status  08/20/2011 9.6* 12.0-15.0 (g/dL) Final     HCT  Date Value Range Status  08/20/2011 28.3* 36.0-46.0 (%) Final    Discharge Diagnoses: Term Pregnancy-delivered  Discharge Information: Date: 08/22/2011 Activity: pelvic rest Diet: routine Medications: None, Ibuprofen and Percocet Condition: stable Instructions: refer to practice specific booklet Discharge to: home   Newborn Data: Live born female  Birth Weight: 7 lb 4.8 oz (3310 g) APGAR: 9, 9  Home with mother.  Laura Johnston G 08/22/2011, 8:33 AM

## 2011-08-22 NOTE — Progress Notes (Signed)
Post Partum Day 3 Subjective: up ad lib, voiding, tolerating PO and + flatus, Ha resolved. Baby with fever, undergoing work- up Objective: Blood pressure 96/59, pulse 91, temperature 98 F (36.7 C), temperature source Oral, resp. rate 18, height 5' 3.5" (1.613 m), weight 81.647 kg (180 lb), unknown if currently breastfeeding.  Physical Exam:  General: alert and cooperative Lochia: appropriate Uterine Fundus: firm Perineum intact with small soft hemorrhoid DVT Evaluation: No evidence of DVT seen on physical exam.   Basename 08/20/11 0601 08/19/11 2350  HGB 9.6* 10.6*  HCT 28.3* 32.0*    Assessment/Plan: Discharge home   LOS: 4 days   CURTIS,CAROL G 08/22/2011, 8:15 AM

## 2011-08-23 ENCOUNTER — Encounter (HOSPITAL_COMMUNITY): Payer: Self-pay | Admitting: *Deleted

## 2011-08-23 ENCOUNTER — Inpatient Hospital Stay (HOSPITAL_COMMUNITY)
Admission: AD | Admit: 2011-08-23 | Discharge: 2011-08-23 | Disposition: A | Discharge status: Home / Self Care | Payer: BC Managed Care – PPO | Source: Ambulatory Visit | Attending: Obstetrics and Gynecology | Admitting: Obstetrics and Gynecology

## 2011-08-23 DIAGNOSIS — K5641 Fecal impaction: Secondary | ICD-10-CM

## 2011-08-23 DIAGNOSIS — O99893 Other specified diseases and conditions complicating puerperium: Secondary | ICD-10-CM | POA: Insufficient documentation

## 2011-08-23 HISTORY — DX: Gestational diabetes mellitus in pregnancy, unspecified control: O24.419

## 2011-08-23 NOTE — Progress Notes (Signed)
Small amount stool returned, pt disimpacted by E. Rice PA.  Approx 200 cc's more enema given.

## 2011-08-23 NOTE — Progress Notes (Signed)
Had to have blood patch on Sun. Headache and back pain better. Baby was dc'd last night, doing ok.

## 2011-08-23 NOTE — ED Provider Notes (Signed)
History   Pt presents today after being seen by Dr. Rana Snare. She has not had a BM since before her vag delivery on Friday of last week. She denies fever but does c/o abd pain and pressure.  Chief Complaint  Patient presents with  . Constipation   HPI  OB History    Grav Para Term Preterm Abortions TAB SAB Ect Mult Living   2 1 1  0 1 0 1 0 0 1      Past Medical History  Diagnosis Date  . AMA (advanced maternal age) multigravida 35+   . Vaginal delivery 08/19/2011  . Gestational diabetes     Past Surgical History  Procedure Date  . Appendectomy   . Knee surgery   . 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 Leep     Family History  Problem Relation Age of Onset  . Stroke Father   . Hypertension Father   . Diabetes Maternal Uncle   . Stroke Paternal Grandmother     History  Substance Use Topics  . Smoking status: Former Games developer  . Smokeless tobacco: Not on file  . Alcohol Use: No    Allergies:  Allergies  Allergen Reactions  . Mobic     Paralysis  . Pseudoephedrine     REACTION: numbness to fingers/tongue    Prescriptions prior to admission  Medication Sig Dispense Refill  . ibuprofen (ADVIL,MOTRIN) 600 MG tablet Take 1 tablet (600 mg total) by mouth every 6 (six) hours.  30 tablet  1  . oxyCODONE-acetaminophen (PERCOCET) 5-325 MG per tablet Take 1-2 tablets by mouth every 3 (three) hours as needed (moderate - severe pain).  30 tablet  0  . prenatal vitamin w/FE, FA (PRENATAL 1 + 1) 27-1 MG TABS Take 1 tablet by mouth daily.         Review of Systems  Constitutional: Negative for fever.  Cardiovascular: Negative for chest pain.  Gastrointestinal: Positive for abdominal pain and constipation. Negative for nausea, vomiting, diarrhea and blood in stool.  Genitourinary: Negative for dysuria, urgency, frequency and hematuria.  Neurological: Negative for dizziness and headaches.  Psychiatric/Behavioral: Negative for depression and suicidal ideas.   Physical Exam    Blood pressure 120/84, pulse 99, temperature 98.1 F (36.7 C), temperature source Oral, resp. rate 20, height 5\' 4"  (1.626 m), weight 178 lb (80.74 kg), currently breastfeeding.  Physical Exam  Constitutional: She is oriented to person, place, and time. She appears well-developed and well-nourished. No distress.  HENT:  Head: Normocephalic and atraumatic.  Eyes: EOM are normal. Pupils are equal, round, and reactive to light.  GI: Soft. She exhibits no distension. There is tenderness. There is no rebound and no guarding.  Genitourinary:       Digital exam demonstrates a large amount of stool in the rectal vault.  Neurological: She is alert and oriented to person, place, and time.  Skin: Skin is warm and dry. She is not diaphoretic.  Psychiatric: She has a normal mood and affect. Her behavior is normal. Judgment and thought content normal.    MAU Course  Procedures  Digital rectal disimpaction was performed. Pt tolerated this procedure well. Moderate amount of results obtained. Soap suds enema also completed.  Assessment and Plan  Rectal impaction: discussed with pt at length. She will continue to use mirilax at home. Discussed diet, activity, risks, and precautions.  Clinton Gallant. Pratik Dalziel III, DrHSc, MPAS, PA-C  08/23/2011, 1:14 PM   Henrietta Hoover, PA 08/23/11 1436

## 2011-08-23 NOTE — Progress Notes (Signed)
Approx 300 cc's more SSE instilled after disimpaction.  Pt had large amount soft stool.  Pt requesting more enema, approx 200 cc's more given.

## 2011-08-23 NOTE — Progress Notes (Signed)
Sent from office, post partem- vag 09/21, had 3degree.  Last BM on the 20th.  Only given stool softener twice.

## 2011-08-26 ENCOUNTER — Inpatient Hospital Stay (HOSPITAL_COMMUNITY): Payer: BC Managed Care – PPO | Admitting: Anesthesiology

## 2011-08-26 ENCOUNTER — Encounter (HOSPITAL_COMMUNITY): Payer: Self-pay | Admitting: Anesthesiology

## 2011-08-26 ENCOUNTER — Encounter (HOSPITAL_COMMUNITY): Payer: Self-pay

## 2011-08-26 ENCOUNTER — Inpatient Hospital Stay (HOSPITAL_COMMUNITY)
Admission: AD | Admit: 2011-08-26 | Discharge: 2011-08-26 | Disposition: A | Discharge status: Home / Self Care | Payer: BC Managed Care – PPO | Source: Ambulatory Visit | Attending: Obstetrics and Gynecology | Admitting: Obstetrics and Gynecology

## 2011-08-26 DIAGNOSIS — G971 Other reaction to spinal and lumbar puncture: Secondary | ICD-10-CM | POA: Insufficient documentation

## 2011-08-26 MED ORDER — CYCLOBENZAPRINE HCL 10 MG PO TABS: 10.0000 mg | ORAL_TABLET | Freq: Three times a day (TID) | ORAL | Status: AC | PRN

## 2011-08-26 MED ORDER — METHYLERGONOVINE MALEATE 0.2 MG PO TABS: 0.2000 mg | ORAL_TABLET | Freq: Three times a day (TID) | ORAL | Status: DC

## 2011-08-26 NOTE — Progress Notes (Signed)
Pt staets she had a SVE on 9-21. Had a blood patch done on 9-23. Continues to have headaches and pain running down back of neck. Sunlight hurts more than inside light.

## 2011-08-26 NOTE — ED Notes (Signed)
Dr. Arby Barrette notified pt in MAU, vag delivery 08/19/2011, rcvd blood patch prior to discharge, has same h/a today, Dr. Arby Barrette to come see pt in MAU .

## 2011-08-26 NOTE — Anesthesia Preprocedure Evaluation (Addendum)
Anesthesia Evaluation  Name, MR# and DOB Patient awake  General Assessment Comment  Reviewed: Allergy & Precautions, H&P , NPO status , Patient's Chart, lab work & pertinent test results  History of Anesthesia Complications (+) POST - OP SPINAL HEADACHE  Airway Mallampati: I TM Distance: >3 FB     Dental No notable dental hx.    Pulmonary  sleep apnea    pulmonary exam normalPulmonary Exam Normal     Cardiovascular     Neuro/Psych   GI/Hepatic/Renal negative GI ROS  negative Liver ROS  negative Renal ROS        Endo/Other    Abdominal   Musculoskeletal   Hematology negative hematology ROS (+)   Peds  Reproductive/Obstetrics    Anesthesia Other Findings             Anesthesia Physical Anesthesia Plan  ASA: II  Anesthesia Plan: MAC   Post-op Pain Management:    Induction:   Airway Management Planned:   Additional Equipment:   Intra-op Plan:   Post-operative Plan:   Informed Consent: I have reviewed the patients History and Physical, chart, labs and discussed the procedure including the risks, benefits and alternatives for the proposed anesthesia with the patient or authorized representative who has indicated his/her understanding and acceptance.     Plan Discussed with: Anesthesiologist  Anesthesia Plan Comments: (Patient desires to have IV caffeine for PDPH relief. EBP five days has failed.)      Anesthesia Quick Evaluation

## 2011-08-26 NOTE — Progress Notes (Signed)
Pt had vaginal delivery 08/19/2011, had blood patch Sunday, came back Tuesday for constipation issues, here today r/t [facility] RN called pt, pt informed RN she is having same h/a, frontal as well as occipital when standing.

## 2011-08-26 NOTE — ED Provider Notes (Signed)
History     Chief Complaint  Patient presents with  . Headache   HPI  Pt delivered a female on 08/19/11 via NSVD, no complications, except post epidural headache.  Pt had blood patch on 08/21/11, returned on 08/23/11 for constipation issue.  Pt here today with continued report of frontal headache with tightening that wraps around the neck.  Denies vision changes or fever, body aches, or chills.     Past Medical History  Diagnosis Date  . AMA (advanced maternal age) multigravida 35+   . Vaginal delivery 08/19/2011  . Gestational diabetes     Past Surgical History  Procedure Date  . Appendectomy   . Knee surgery   . 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 Leep     Family History  Problem Relation Age of Onset  . Stroke Father   . Hypertension Father   . Diabetes Maternal Uncle   . Stroke Paternal Grandmother     History  Substance Use Topics  . Smoking status: Former Games developer  . Smokeless tobacco: Never Used  . Alcohol Use: No    Allergies:  Allergies  Allergen Reactions  . Mobic     Paralysis  . Pseudoephedrine     REACTION: numbness to fingers/tongue    Prescriptions prior to admission  Medication Sig Dispense Refill  . ibuprofen (ADVIL,MOTRIN) 600 MG tablet Take 1 tablet (600 mg total) by mouth every 6 (six) hours.  30 tablet  1  . oxyCODONE-acetaminophen (PERCOCET) 5-325 MG per tablet Take 1-2 tablets by mouth every 3 (three) hours as needed (moderate - severe pain).  30 tablet  0  . prenatal vitamin w/FE, FA (PRENATAL 1 + 1) 27-1 MG TABS Take 1 tablet by mouth daily.         Review of Systems  Constitutional: Negative.   HENT: Positive for neck pain.   Eyes: Negative.   Gastrointestinal: Negative for nausea, vomiting and abdominal pain.  Neurological: Positive for headaches.   Physical Exam   Blood pressure 136/67, pulse 104, temperature 98.5 F (36.9 C), temperature source Oral, resp. rate 16, last menstrual period 08/19/2011, currently  breastfeeding.  Physical Exam  Constitutional: She is oriented to person, place, and time. She appears well-developed and well-nourished.  HENT:  Head: Normocephalic.  Eyes: Pupils are equal, round, and reactive to light.  Neck: Normal range of motion. Neck supple.  Cardiovascular: Normal rate, regular rhythm and normal heart sounds.   Respiratory: Effort normal and breath sounds normal.  Genitourinary: No bleeding around the vagina. Vaginal discharge (mucusy) found.  Neurological: She is alert and oriented to person, place, and time. She has normal reflexes.  Skin: Skin is warm and dry.    MAU Course  Procedures   Assessment and Plan  Spinal Headache  Plan: Dr. Arby Barrette to see pt in MAU per orders of Dr. Arelia Sneddon  [facility] 08/26/2011, 3:19 PM

## 2011-08-26 NOTE — Anesthesia Postprocedure Evaluation (Signed)
The patient consented to IV Caffeine only for [place] to find out that it is on national backorder. She accepts Methergine 0.2mg  PO tid x1 day and if headache better will take two more days, as well as Flexeril 10mg  tid.

## 2011-08-27 NOTE — Addendum Note (Signed)
Addendum  created 08/27/11 0232 by Brantlee Hinde L. Bernard Slayden   Modules edited:Charting, Inpatient Notes

## 2011-08-29 ENCOUNTER — Ambulatory Visit (HOSPITAL_COMMUNITY)
Admission: RE | Admit: 2011-08-29 | Discharge: 2011-08-29 | Disposition: A | Discharge status: Home / Self Care | Payer: BC Managed Care – PPO | Source: Ambulatory Visit | Attending: Obstetrics and Gynecology | Admitting: Obstetrics and Gynecology

## 2011-08-29 NOTE — Progress Notes (Signed)
Adult Lactation Consultation Outpatient Visit Note  Patient Name: Laura Johnston    [address] [place], [place] 08[birth year], birth 7lb 4oz. Date of Birth: 09-02-1971 Gestational Age at Delivery: Unknown  39+ Type of Delivery: SVD  Breastfeeding History: Frequency of Breastfeeding: Mom reports baby will not latch, last 2 days has been feeding with bottles Length of Feeding:  Voids: 6-8 Stools: 3-4, mustard color  Supplementing / Method: Pumping:  Type of Pump:  Medela DEBP   Frequency:   Every 3 hours during the day,  5-6 times in 24 hours, pumping 5-6 minutes due to pain with pumping.  Volume:  1-2 oz   Comments:  Baby was started on supplements while in the hospital. He was behind on voids and stools. Baby is now refusing the breast and mom is bottle feeding with EBM/formula. Mom reports baby is taking 4oz each feeding. Baby is now 78 days old. Mom wants to try to get him back to the breast.     Consultation Evaluation:  Initial Feeding Assessment: Pre-feed Weight:  8lb 2.4oz/ 3698gm Post-feed Weight:  8lb 2.8oz/ 3710gm Amount Transferred: 12ml Comments:  Left breast  Additional Feeding Assessment: Pre-feed Weight:  Post-feed Weight: Amount Transferred:  none Comments:  Right breast  Additional Feeding Assessment: Pre-feed Weight: Post-feed Weight: Amount Transferred: Comments:  Total Breast milk Transferred this Visit: 12 ml from left breast Total Supplement Given: 15ml formula with SNS  Additional Interventions:  Baby Boy [facility] had been fed 2 oz with bottle at 12:00, therefore he was not real active at the breast for this visit. Mom has not been able to get him to latch to her breast for the past 2 days but he latched easily with LC assist. Worked with mom with latch technique and positioning. Reviewing with her what a deep latch looks like. Demonstrated and assisted mom to use an SNS to supplement instead of the bottle to help get baby back to breast and protect her  milk supply. Mom post-pumped and obtained approx 2 oz of EBM. Changed her flange size to 30 and she was able to pump without discomfort. Reviewed with mom the importance of a deep latch for [facility] to transfer milk. Advised he needs to be receiving at this age 60-3 oz with each feeding. Advised mom to keep [name] at the breast, keep him active at the breast for 15-20 minutes or more each time he breast feeds. If he will not latch or is fussy at the breast she can use the SNS to supplement but try to use this on the second breast. Post-pump if he is not actively nursing after feeding for 15 minutes to protect milk supply and to have EBM to use for supplement if needed.    Follow-Up  Outpatient Lactation appt. Monday, 09/05/11 2:30    Alfred Levins 08/29/2011, 1:17 PM

## 2011-08-30 LAB — DIFFERENTIAL
Eosinophils Absolute: 0.1
Lymphocytes Relative: 30
Lymphs Abs: 2
Neutro Abs: 4.1
Neutrophils Relative %: 62

## 2011-08-30 LAB — COMPREHENSIVE METABOLIC PANEL
BUN: 4 — ABNORMAL LOW
CO2: 23
Calcium: 9
Creatinine, Ser: 0.47
GFR calc non Af Amer: >60
Glucose, Bld: 96

## 2011-08-30 LAB — CBC
Hemoglobin: 12.6
MCHC: 34
MCV: 87.3
RBC: 4.23

## 2011-08-30 LAB — URINALYSIS, ROUTINE W REFLEX MICROSCOPIC
Bilirubin Urine: NEGATIVE
Hgb urine dipstick: NEGATIVE
Ketones, ur: 15 — AB
Specific Gravity, Urine: 1.018
Urobilinogen, UA: 0.2
pH: 6.5

## 2011-09-02 ENCOUNTER — Encounter (HOSPITAL_COMMUNITY): Payer: Self-pay

## 2011-09-05 ENCOUNTER — Ambulatory Visit (HOSPITAL_COMMUNITY)
Admission: RE | Admit: 2011-09-05 | Discharge: 2011-09-05 | Disposition: A | Discharge status: Home / Self Care | Payer: BC Managed Care – PPO | Source: Ambulatory Visit | Attending: Obstetrics and Gynecology | Admitting: Obstetrics and Gynecology

## 2011-09-05 NOTE — Progress Notes (Signed)
Adult Lactation Consultation Outpatient Visit Note  Patient Name: YOUNIQUE CASAD   [address] [place] [address] [place], [place] 08/19/11 Now 83 days old Date of Birth: 1971-11-02 Gestational Age at Delivery: Unknown   39+ Type of Delivery: SVB  Breastfeeding History: Frequency of Breastfeeding: 3-4 times a day Length of Feeding: 5-10 min Voids: 6-8 Stools: 4-5  Supplementing / Method: Pumping:  Type of Pump: DEBP   Frequency:  3-4 times during the day, 2 times at night  Volume:  4-5 oz  Comments:  Mom reports baby not latching well, will latch to left breast only and suckles for 5-10 min. She has been supplementing with EBM or EBM/formula 4oz each feed with bottle and slow-flow nipple. Not using SNS. Today, she breastfed at 0700, pumped at 10:00 with return of 3 1/2 oz and gave this to the baby in a bottle, then gave 2oz formula with bottle at 1300. Mom has not been able to get baby to latch to right breast. On exam her left nipple is larger than the right. With checking the baby's suck, he does not curl his tongue well around my finger, but will maintain a rhythmic suck.   Consultation Evaluation:  Initial Feeding Assessment: Pre-feed Weight:  9lb 1.1oz/4114 gm Post-feed Weight:  9;b 2.0 oz/ 4140 gm Amount Transferred: 26 ml  Comments:  Left breast  Additional Feeding Assessment: Pre-feed Weight:   4140gm Post-feed Weight:  4150gm Amount Transferred: 10ml Comments:  Right breast  Additional Feeding Assessment: Pre-feed Weight: Post-feed Weight: Amount Transferred: Comments:  Total Breast milk Transferred this Visit: 36 ml  Total Supplement Given: 30 ml EBM  Additional Interventions:  Assisted mom to latch baby to left breast, he nursed with some swallows audible for 20 minutes transferring 26 ml. Switched to right breast, after several attempts, he did latch on and nursed for 17 min transferred 10ml. Few swallows audible. Attempted to re-latch [facility] but he would not latch,  arched his back, extremely fussy. Tried a #24 nipple shield putting EBM in end of nipple shield to stimulate latch, he would take a few sucks then become very fussy and off the breast. Used the nipple shield in an effort to help mom with latch and with the idea she could latch him better using the SNS to supplement on this side to keep him trying to nurse on the right breast. Discussed with mom positioning to improve milk transfer, discussed that she needs to have [name] at the breast with each feeding not just 3-4 times/day to improve his suck and protect her milk supply. Advised her to always breastfeed first, supplement with SNS 30-38ml at breast on right breast to encourage him to latch. To continue with slow flow nipple if using bottles. Monitor voids/stools.    Follow-Up   Outpatient scheduled Monday, September 12, 2011 at 1:00.      Alfred Levins 09/05/2011, 4:40 PM

## 2011-09-12 ENCOUNTER — Encounter (HOSPITAL_COMMUNITY): Payer: BC Managed Care – PPO

## 2011-09-14 ENCOUNTER — Ambulatory Visit (HOSPITAL_COMMUNITY)
Admission: RE | Admit: 2011-09-14 | Discharge: 2011-09-14 | Disposition: A | Discharge status: Home / Self Care | Payer: BC Managed Care – PPO | Source: Ambulatory Visit | Attending: Obstetrics and Gynecology | Admitting: Obstetrics and Gynecology

## 2011-09-14 NOTE — Progress Notes (Signed)
Adult Lactation Consultation Outpatient Visit Note  Patient Name: Laura Johnston   BABY:  [name] Date of Birth: 12/10/1970                   DOB:  [birth year] Gestational Age at Delivery: 39.3     BIRTH WEIGHT:  7-4 Type of Delivery:                               WEIGHT TODAY:  10-2.6  Breastfeeding History: Frequency of Breastfeeding: ONCE DAY, MOSTLY PUMPING AND BOTTLE FEEDING Length of Feeding:  Voids:  Stools:   Supplementing / Method: Pumping:  Type of Pump:PIS  Frequency:EVERY 3 HOURS  Volume:  2 1/2 OUNCES  Comments: MOM FEEDING EBM MIXED WITH FORMULA(SIMILAC FOR FUSSINESS AND GAS) , TOTAL 4 OUNCES EVERY 3-4 HOURS, BUT AT NIGHT TAKES 2 OUNCES AT A TIME MORE FREQUENTLY.  MOTHER ONLY PUMPING ONE BREAST AT A TIME A DOESN'T LET PUMP SWITCH TO EXPRESSION PHASE.  INSTRUCTED MOM ON CORRECT PUMPING.    Consultation Evaluation:ASSISTED MOTHER WITH TECHNIQUES FOR EASIER AND DEEPER LATCH.  BABY LATCHED AFTER A FEW ATTEMPTS TO BOTH BREASTS AND NURSED WELL.  PLAN:  1) BREASTFEED BABY WITH EARLY FEEDING CUES 2) PUMP BOTH BREASTS X 15 MIN AFTER FEEDINGS 3) OFFER EXPRESSED MILK PER BOTTLE IF STILL HUNGRY BUT DECREASE AMNT OF FORMULA BABY TAKES  Initial Feeding Assessment: Pre-feed [id number] Post-feed [id number] Amount Transferred:26 Comments:  Additional Feeding Assessment: Pre-feed [id number] Post-feed [id number] Amount Transferred: Comments:  Additional Feeding Assessment: Pre-feed Weight: Post-feed Weight: Amount Transferred: Comments:  Total Breast milk Transferred this Visit: 46 MLS Total Supplement Given: NONE  Additional Interventions:   Follow-Up  WILL CALL PRN      Hansel Feinstein 09/14/2011, 4:39 PM

## 2011-10-12 ENCOUNTER — Inpatient Hospital Stay (HOSPITAL_COMMUNITY)
Admission: AD | Admit: 2011-10-12 | Discharge: 2011-10-12 | Disposition: A | Discharge status: Home / Self Care | Payer: BC Managed Care – PPO | Source: Ambulatory Visit | Attending: Obstetrics and Gynecology | Admitting: Obstetrics and Gynecology

## 2011-10-12 ENCOUNTER — Encounter (HOSPITAL_COMMUNITY): Payer: Self-pay | Admitting: *Deleted

## 2011-10-12 ENCOUNTER — Emergency Department (INDEPENDENT_AMBULATORY_CARE_PROVIDER_SITE_OTHER)
Admission: EM | Admit: 2011-10-12 | Discharge: 2011-10-12 | Payer: BC Managed Care – PPO | Source: Home / Self Care | Attending: Family Medicine | Admitting: Family Medicine

## 2011-10-12 ENCOUNTER — Inpatient Hospital Stay (HOSPITAL_COMMUNITY): Payer: BC Managed Care – PPO

## 2011-10-12 ENCOUNTER — Encounter: Payer: Self-pay | Admitting: *Deleted

## 2011-10-12 DIAGNOSIS — R109 Unspecified abdominal pain: Secondary | ICD-10-CM

## 2011-10-12 DIAGNOSIS — R319 Hematuria, unspecified: Secondary | ICD-10-CM

## 2011-10-12 HISTORY — DX: Unspecified abnormal cytological findings in specimens from cervix uteri: R87.619

## 2011-10-12 HISTORY — DX: Gastro-esophageal reflux disease without esophagitis: K21.9

## 2011-10-12 HISTORY — DX: Reserved for concepts with insufficient information to code with codable children: IMO0002

## 2011-10-12 LAB — URINALYSIS, ROUTINE W REFLEX MICROSCOPIC
Bilirubin Urine: NEGATIVE
Ketones, ur: NEGATIVE mg/dL
Protein, ur: NEGATIVE mg/dL
Urobilinogen, UA: 0.2 mg/dL (ref 0.0–1.0)

## 2011-10-12 LAB — COMPREHENSIVE METABOLIC PANEL
AST: 36 U/L (ref 0–37)
CO2: 25 mEq/L (ref 19–32)
Chloride: 99 mEq/L (ref 96–112)
Creatinine, Ser: 0.51 mg/dL (ref 0.50–1.10)
GFR calc non Af Amer: >90 mL/min (ref >90)
Total Bilirubin: 0.2 mg/dL — ABNORMAL LOW (ref 0.3–1.2)

## 2011-10-12 LAB — URINE MICROSCOPIC-ADD ON

## 2011-10-12 LAB — CBC
HCT: 35.4 % — ABNORMAL LOW (ref 36.0–46.0)
MCV: 87.2 fL (ref 78.0–100.0)
Platelets: 196 10*3/uL (ref 150–400)
RBC: 4.06 MIL/uL (ref 3.87–5.11)
WBC: 6.2 10*3/uL (ref 4.0–10.5)

## 2011-10-12 MED ORDER — HYDROCODONE-ACETAMINOPHEN 5-500 MG PO TABS: 1.0000 | ORAL_TABLET | Freq: Four times a day (QID) | ORAL | Status: AC | PRN

## 2011-10-12 MED ORDER — HYDROMORPHONE HCL PF 1 MG/ML IJ SOLN
INTRAMUSCULAR | Status: AC
  Administered 2011-10-12: 2 mg
  Filled 2011-10-12: qty 2

## 2011-10-12 MED ORDER — ONDANSETRON 8 MG PO TBDP: 8.0000 mg | ORAL_TABLET | Freq: Three times a day (TID) | ORAL | Status: AC | PRN

## 2011-10-12 MED ORDER — HYDROMORPHONE HCL PF 1 MG/ML IJ SOLN: 2.0000 mg | Freq: Once | INTRAMUSCULAR | Status: DC

## 2011-10-12 MED ORDER — AMOXICILLIN-POT CLAVULANATE 875-125 MG PO TABS: 1.0000 | ORAL_TABLET | Freq: Two times a day (BID) | ORAL | Status: AC

## 2011-10-12 NOTE — ED Notes (Signed)
States has had chest pain since delivery and was dx with acid reflux and prescribed ranitidine. States did not have any problems with pregnancy, but her placenta had to be removed manually in "pieces."  States she has been bleeding since delivery, varies in amount.  Patient very uncomfortable, standing by bed and moving about. Negative CVA tenderness. States pain goes from LLQ to L flank.

## 2011-10-12 NOTE — Progress Notes (Signed)
Patient states she had a SVD on 9-21.Has had flank pain since last Friday. This morning when picking up the baby had a sudden onset of worsening left flank pain that is now radiating into left lower abdomen. Pain is constant with periods of increasing pain. Patient state she has continued to have bleeding since the delivery. Is currently being treated for a UTIdiagnosed after the postpartum visit. Was seen today at [facility]. Sent to MAU for further evaluation.

## 2011-10-12 NOTE — ED Notes (Signed)
Patient was seen for her post partum visit Nov. 5th. She complained of dysuria and had WBCs in her urine. Several days later she developed left sided back pain, her OB prescribed nitrofur-macro which she has taken for 2 days. . Today her left side back pain is worse and she c/o pain on the left side of her abdomen.

## 2011-10-12 NOTE — ED Provider Notes (Addendum)
History     Chief Complaint  Patient presents with  . Flank Pain   HPIIvette N Bernhardt40 y.o.Patient of [facility] presented to [facility] with flank pain.  Delivered 08/19/11 by Dr. Henderson Cloud, uncomplicated NSVD.  Denies kidney stones.  11/5 told her doctor she had dysuria, urine test showed blood and WBCs.  Was treated with Macrobid on 11/12.  Continues with dysuria.  Has had left lower back/flank pain on 11/7 and then today much worse with pain now radiating into lower left quadrant.  Denies fever.  Denies chills but has sweating when she has pain.  States she has a little vaginal bleeding today.  States "took pieces of my placenta out, he went in 3 or 4 times to get everything".    Past Medical History  Diagnosis Date  . AMA (advanced maternal age) multigravida 35+   . Vaginal delivery 08/19/2011  . Gestational diabetes   . GERD (gastroesophageal reflux disease)   . Abnormal Pap smear     Past Surgical History  Procedure Date  . Appendectomy   . Knee surgery   . 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 Leep     Family History  Problem Relation Age of Onset  . Stroke Father   . Hypertension Father   . Diabetes Maternal Uncle   . Stroke Paternal Grandmother     History  Substance Use Topics  . Smoking status: Former Games developer  . Smokeless tobacco: Never Used  . Alcohol Use: No    Allergies:  Allergies  Allergen Reactions  . Hydrocortisone   . Mobic     Paralysis  . Pseudoephedrine     REACTION: numbness to fingers/tongue    Prescriptions prior to admission  Medication Sig Dispense Refill  . ibuprofen (ADVIL,MOTRIN) 800 MG tablet Take 800 mg by mouth every 8 (eight) hours as needed. For pain       . Multiple Vitamins-Calcium (ONE-A-DAY WOMENS FORMULA) TABS Take by mouth.        . nitrofurantoin (MACRODANTIN) 50 MG capsule Take 50 mg by mouth 4 (four) times daily.        . ranitidine (ZANTAC) 15 MG/ML syrup Take by mouth 2 (two) times daily.         . methylergonovine (METHERGINE) 0.2 MG tablet Take 1 tablet (0.2 mg total) by mouth 3 (three) times daily.  10 tablet  0    Review of Systems  Constitutional: Positive for chills. Negative for fever.  HENT: Negative.   Respiratory: Negative.   Cardiovascular: Negative.   Gastrointestinal: Negative for nausea and vomiting.  Skin: Negative.    Physical Exam   Blood pressure 123/86, pulse 105, temperature 98.2 F (36.8 C), temperature source Oral, resp. rate 20, height 5' 3.5" (1.613 m), weight 163 lb 9.6 oz (74.208 kg), SpO2 97.00%, unknown if currently breastfeeding.  Physical Exam  Constitutional: She is oriented to person, place, and time. She appears well-developed and well-nourished. She appears distressed.  HENT:  Head: Normocephalic.  Mouth/Throat: Oropharyngeal exudate: anxious, squirmy in the bed.  Eyes: Pupils are equal, round, and reactive to light.  Neck: Normal range of motion.  Respiratory: Effort normal.  GI: Soft. She exhibits no mass. There is Tenderness: Left lower quadrant.. There is no rebound and no guarding.  Neurological: She is alert and oriented to person, place, and time.  Skin: Skin is warm and dry.   UA at [facility]--SG 1.015, BLOOD-Moderate, NIT NEG, and LEU TRace.  MAU Course  Procedures CT Clinical Data: Flank pain.  CT ABDOMEN AND PELVIS WITHOUT CONTRAST  Technique: Multidetector CT imaging of the abdomen and pelvis was  performed following the standard protocol without intravenous  contrast.  Comparison: None.  Findings: Lung bases show minimal dependent subpleural atelectasis.  Heart size normal. No pericardial or pleural effusion.  Liver measures 21 cm. A stone is seen in the gallbladder. The  right adrenal gland is unremarkable. 1.5 cm low attenuation lesion  in the medial limb of the left adrenal gland measures -21  Hounsfield units. Right kidney is unremarkable. Low attenuation  lesion in the upper pole left kidney measures 1.8  cm and has  dependent high attenuation, possibly representing milk of calcium  within a cyst. The left ureter is minimally dilated but there are  no associated urinary stones.  Spleen, pancreas, stomach and bowel are unremarkable. Uterus is  somewhat lobulated in contour and contains high attenuation  material in the endometrial canal. Findings may relate to recent  postpartum state. No pathologically enlarged lymph nodes. No free  fluid. No worrisome lytic or sclerotic lesions.  IMPRESSION:  1. Left ureter may be minimally dilated but there are no  associated urinary stones.  2. Hepatomegaly.  3. Cholelithiasis.  4. Left adrenal adenoma.   Results for orders placed during the hospital encounter of 10/12/11 (from the past 24 hour(s))  URINALYSIS, ROUTINE W REFLEX MICROSCOPIC     Status: Abnormal   Collection Time   10/12/11  4:00 PM      Component Value Range   Color, Urine YELLOW  YELLOW    Appearance HAZY (*) CLEAR    Specific Gravity, Urine >1.030 (*) 1.005 - 1.030    pH 5.5  5.0 - 8.0    Glucose, UA NEGATIVE  NEGATIVE (mg/dL)   Hgb urine dipstick LARGE (*) NEGATIVE    Bilirubin Urine NEGATIVE  NEGATIVE    Ketones, ur NEGATIVE  NEGATIVE (mg/dL)   Protein, ur NEGATIVE  NEGATIVE (mg/dL)   Urobilinogen, UA 0.2  0.0 - 1.0 (mg/dL)   Nitrite NEGATIVE  NEGATIVE    Leukocytes, UA TRACE (*) NEGATIVE   URINE MICROSCOPIC-ADD ON     Status: Abnormal   Collection Time   10/12/11  4:00 PM      Component Value Range   Squamous Epithelial / LPF MANY (*) RARE    WBC, UA 3-6  <3 (WBC/hpf)   RBC / HPF 11-20  <3 (RBC/hpf)   Bacteria, UA FEW (*) RARE    Urine-Other MUCOUS PRESENT    CBC     Status: Abnormal   Collection Time   10/12/11  5:44 PM      Component Value Range   WBC 6.2  4.0 - 10.5 (K/uL)   RBC 4.06  3.87 - 5.11 (MIL/uL)   Hemoglobin 11.2 (*) 12.0 - 15.0 (g/dL)   HCT 16.1 (*) 09.6 - 46.0 (%)   MCV 87.2  78.0 - 100.0 (fL)   MCH 27.6  26.0 - 34.0 (pg)   MCHC 31.6  30.0 -  36.0 (g/dL)   RDW 04.5  40.9 - 81.1 (%)   Platelets 196  150 - 400 (K/uL)  COMPREHENSIVE METABOLIC PANEL     Status: Abnormal (Preliminary result)   Collection Time   10/12/11  5:44 PM      Component Value Range   Sodium 135  135 - 145 (mEq/L)   Potassium 4.2  3.5 - 5.1 (mEq/L)   Chloride 99  96 - 112 (mEq/L)   CO2 25  19 - 32 (mEq/L)   Glucose, Bld 94  70 - 99 (mg/dL)   BUN 9  6 - 23 (mg/dL)   Creatinine, Ser 1.61  0.50 - 1.10 (mg/dL)   Calcium 09.6  8.4 - 10.5 (mg/dL)   Total Protein 7.4  6.0 - 8.3 (g/dL)   Albumin 3.8  3.5 - 5.2 (g/dL)   AST PENDING  0 - 37 (U/L)   ALT 45 (*) 0 - 35 (U/L)   Alkaline Phosphatase 116  39 - 117 (U/L)   Total Bilirubin 0.2 (*) 0.3 - 1.2 (mg/dL)   GFR calc non Af Amer >90  >90 (mL/min)   GFR calc Af Amer >90  >90 (mL/min)   MDM    16:05  Reported MSE to Dr. Vincente Poli. UA results from [facility] reported.   Order given to give Toradol 60mg  IM and to have CT to rule out kidney stone.   Cannot give Toradol because she took Ibuprofen 800mg  at UC.  Called Dr. Vincente Poli, order given for Dilaudid 2mg  IM or PO.  Patient preferred the IM injection for quicker relief of pain.   16:55 care turned over to T. Burleson,NP.  Assessment and Plan  Dr. Vincente Poli in and discussed CT findings with Laura Johnston and did physical exam. hematuria Likely has passed a kidney stone vs musculoskeletal back pain  Plan: Will treat for uti with augmentin Will culture urine Will recheck urine in AM for blood.  Laura Johnston to call the office at 8:30 am to schedule an appointment Will need to review CMP as one value could not be completed tonight in the lab and had to be sent to [facility] for completion.  Slightly elevated liver function test noted.  Also was elevated when checked one year ago.  KEY,EVE M 10/12/2011, 3:46 PM by   Matt Holmes, NP 10/12/11 1653  Nolene Bernheim, NP 10/12/11 1907  Nolene Bernheim, NP 10/12/11 1908  Nolene Bernheim, NP 10/12/11 1912

## 2011-10-14 LAB — URINE CULTURE: Culture: NO GROWTH

## 2011-10-16 NOTE — ED Provider Notes (Signed)
History     CSN: [id number] Arrival date & time: 10/12/2011 12:37 PM   First MD Initiated Contact with Patient 10/12/11 1304      Chief Complaint  Patient presents with  . Back Pain  . Flank Pain     HPI Comments: Patient had a normal vaginal delivery on 08/19/11.  She had a post-partum visit on 10/03/11, with an unremarkable exam.  She was noted to have pyuria at that time.  Several days later she developed left sided back pain and she was started on Macrobid. She reports that she had had a third degree perineal laceration during her delivery. She complains of 5 day history of increasing left back and side pain, worse with movement and deep inspiration, although she does not have a cough.  She continues to have urinary frequency and mild dysuria.  She has developed nausea and occasional vomiting.  She notes that she has slight vaginal bleeding.  Patient is a 40 y.o. female presenting with back pain and flank pain. The history is provided by the patient.  Back Pain  This is a new problem. The current episode started more than 1 week ago. The problem occurs constantly. The problem has been gradually worsening. The pain is associated with no known injury. Pain location: Left lower back and flank area. The quality of the pain is described as aching. Radiates to: left abdomen. The symptoms are aggravated by certain positions. Associated symptoms include abdominal pain and dysuria. Pertinent negatives include no chest pain, no fever, no numbness, no abdominal swelling, no bowel incontinence, no perianal numbness, no bladder incontinence, no pelvic pain, no leg pain, no paresthesias, no tingling and no weakness.  Flank Pain Associated symptoms include abdominal pain. Pertinent negatives include no chest pain.    Past Medical History  Diagnosis Date  . AMA (advanced maternal age) multigravida 35+   . Vaginal delivery 08/19/2011  . Gestational diabetes   . GERD (gastroesophageal reflux disease)     . Abnormal Pap smear     Past Surgical History  Procedure Date  . Appendectomy   . Knee surgery   . 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 Leep     Family History  Problem Relation Age of Onset  . Stroke Father   . Hypertension Father   . Diabetes Maternal Uncle   . Stroke Paternal Grandmother     History  Substance Use Topics  . Smoking status: Former Games developer  . Smokeless tobacco: Never Used  . Alcohol Use: No    OB History    Grav Para Term Preterm Abortions TAB SAB Ect Mult Living   2 1 1  0 1 0 1 0 0 1      Review of Systems  Constitutional: Positive for activity change and fatigue. Negative for fever, chills and diaphoresis.  HENT: Negative.   Eyes: Negative.   Respiratory: Negative.   Cardiovascular: Negative for chest pain and palpitations.  Gastrointestinal: Positive for nausea, vomiting, abdominal pain and constipation. Negative for abdominal distention and bowel incontinence.  Genitourinary: Positive for dysuria, urgency, frequency, flank pain and vaginal bleeding. Negative for bladder incontinence, difficulty urinating and pelvic pain.  Musculoskeletal: Positive for back pain.  Skin: Negative.   Neurological: Negative for tingling, weakness, numbness and paresthesias.    Allergies  Hydrocortisone; Mobic; and Pseudoephedrine  Home Medications   Current Outpatient Rx  Name Route Sig Dispense Refill  . RANITIDINE HCL 15 MG/ML PO SYRP Oral Take by mouth  2 (two) times daily.      . AMOXICILLIN-POT CLAVULANATE 875-125 MG PO TABS Oral Take 1 tablet by mouth 2 (two) times daily. 14 tablet 0  . HYDROCODONE-ACETAMINOPHEN 5-500 MG PO TABS Oral Take 1 tablet by mouth every 6 (six) hours as needed for pain. 10 tablet 0  . IBUPROFEN 800 MG PO TABS Oral Take 800 mg by mouth every 8 (eight) hours as needed. For pain     . ONE-A-DAY WOMENS FORMULA PO TABS Oral Take by mouth.      . ONDANSETRON 8 MG PO TBDP Oral Take 1 tablet (8 mg total) by mouth every 8 (eight) hours  as needed for nausea. Place under tongue to dissolve. 20 tablet 0    BP 124/83  Pulse 82  Temp(Src) 98.3 F (36.8 C) (Oral)  Resp 18  Ht 5\' 4"  (1.626 m)  Wt 164 lb (74.39 kg)  BMI 28.15 kg/m2  SpO2 98%  Physical Exam  Constitutional: She is oriented to person, place, and time. She appears well-developed and well-nourished. No distress.       Patient appears uncomfortable moving onto exam table.  HENT:  Head: Normocephalic.  Nose: Nose normal.  Mouth/Throat: Oropharynx is clear and moist.  Eyes: Conjunctivae are normal. Pupils are equal, round, and reactive to light.  Neck: Neck supple.  Cardiovascular: Normal rate, regular rhythm and normal heart sounds.   Pulmonary/Chest: Effort normal and breath sounds normal. No respiratory distress. She has no wheezes. She has no rales. She exhibits no tenderness.  Abdominal: Soft. She exhibits no distension (There is tenderness in the left lower quadrant and suprapubic area.  There is also tenderness in the left flank) and no mass. There is tenderness. There is no rebound and no guarding.  Musculoskeletal: She exhibits no edema.  Lymphadenopathy:    She has no cervical adenopathy.  Neurological: She is alert and oriented to person, place, and time.  Skin: No rash noted.    ED Course  Procedures None  Labs Reviewed - Urinalysis (dipstick): moderate blood, trace leuks    1. Abdominal pain       MDM  With exam findings of significant lower abdominal tenderness and left flank pain, and history of recent vaginal delivery, will refer to [facility] for immediate evaluation.  She will need CBC and possibly abdominal/pelvic CT scan. Patient states that she will have husband drive her to hospital.        Donna Christen, MD 10/16/11 [phone]

## 2011-10-28 ENCOUNTER — Other Ambulatory Visit (HOSPITAL_COMMUNITY): Payer: Self-pay | Admitting: Obstetrics and Gynecology

## 2011-10-28 DIAGNOSIS — R1110 Vomiting, unspecified: Secondary | ICD-10-CM

## 2011-10-28 DIAGNOSIS — R1907 Generalized intra-abdominal and pelvic swelling, mass and lump: Secondary | ICD-10-CM

## 2011-11-01 ENCOUNTER — Ambulatory Visit (HOSPITAL_COMMUNITY)
Admission: RE | Admit: 2011-11-01 | Discharge: 2011-11-01 | Disposition: A | Discharge status: Home / Self Care | Payer: BC Managed Care – PPO | Source: Ambulatory Visit | Attending: Obstetrics and Gynecology | Admitting: Obstetrics and Gynecology

## 2011-11-01 DIAGNOSIS — R1907 Generalized intra-abdominal and pelvic swelling, mass and lump: Secondary | ICD-10-CM

## 2011-11-01 DIAGNOSIS — Q619 Cystic kidney disease, unspecified: Secondary | ICD-10-CM | POA: Insufficient documentation

## 2011-11-01 DIAGNOSIS — R109 Unspecified abdominal pain: Secondary | ICD-10-CM | POA: Insufficient documentation

## 2011-11-01 DIAGNOSIS — R1110 Vomiting, unspecified: Secondary | ICD-10-CM | POA: Insufficient documentation

## 2011-11-01 DIAGNOSIS — D3500 Benign neoplasm of unspecified adrenal gland: Secondary | ICD-10-CM | POA: Insufficient documentation

## 2011-11-01 DIAGNOSIS — K5900 Constipation, unspecified: Secondary | ICD-10-CM | POA: Insufficient documentation

## 2011-11-01 DIAGNOSIS — K8020 Calculus of gallbladder without cholecystitis without obstruction: Secondary | ICD-10-CM | POA: Insufficient documentation

## 2011-11-01 MED ORDER — IOHEXOL 300 MG/ML  SOLN
100.0000 mL | Freq: Once | INTRAMUSCULAR | Status: AC | PRN
  Administered 2011-11-01: 100 mL via INTRAVENOUS

## 2011-11-04 ENCOUNTER — Other Ambulatory Visit: Payer: Self-pay | Admitting: Gastroenterology

## 2013-02-08 ENCOUNTER — Encounter: Payer: Self-pay | Admitting: Physician Assistant

## 2013-02-08 ENCOUNTER — Ambulatory Visit (INDEPENDENT_AMBULATORY_CARE_PROVIDER_SITE_OTHER): Payer: BC Managed Care – PPO | Admitting: Physician Assistant

## 2013-02-08 VITALS — BP 131/90 | HR 79 | Wt 173.0 lb

## 2013-02-08 DIAGNOSIS — J029 Acute pharyngitis, unspecified: Secondary | ICD-10-CM

## 2013-02-08 DIAGNOSIS — R059 Cough, unspecified: Secondary | ICD-10-CM

## 2013-02-08 MED ORDER — AZITHROMYCIN 250 MG PO TABS: ORAL_TABLET | ORAL | Status: DC

## 2013-02-08 MED ORDER — HYDROCOD POLST-CHLORPHEN POLST 10-8 MG/5ML PO LQCR: 5.0000 mL | Freq: Every evening | ORAL | Status: DC | PRN

## 2013-02-08 MED ORDER — DICLOFENAC SODIUM 75 MG PO TBEC: 75.0000 mg | DELAYED_RELEASE_TABLET | Freq: Two times a day (BID) | ORAL | Status: DC

## 2013-02-08 NOTE — Progress Notes (Signed)
  Subjective:    Patient ID: Laura Johnston, female    DOB: [birth year], 42 y.o.   MRN: [id number]  HPI Patient presents to the clinic with ST for 5 days. It was so bad she went ahead and started augmentin from [place]. After taking for 2 days started feeling very nauseated and not eating as much. ST continued through it all and feels like her neck and glands are really swollen. Diarrhea then started. She took immodium and did help but then diarrhea came back. Denies any blood is stool. No abdominal pain, fever, chills, sinus pressure, ear pain, SOB or wheezing. She does have a cough.   Review of Systems     Objective:   Physical Exam  Constitutional: She is oriented to person, place, and time. She appears well-developed and well-nourished.  HENT:  Head: Normocephalic and atraumatic.  Right Ear: External ear normal.  Left Ear: External ear normal.  Mouth/Throat: Oropharynx is clear and moist.  Eyes: Conjunctivae are normal.  Neck:  Left side of neck cervical lymphnodes were swollen and full/tender to touch.  Cardiovascular: Normal rate, regular rhythm and normal heart sounds.   Pulmonary/Chest: Effort normal and breath sounds normal. She has no wheezes.  Abdominal: Soft. Bowel sounds are normal. She exhibits no distension and no mass. There is no tenderness. There is no rebound and no guarding.  Neurological: She is alert and oriented to person, place, and time.  Skin: Skin is warm and dry.  Psychiatric: She has a normal mood and affect. Her behavior is normal.          Assessment & Plan:  Acute pharyngitis/cough- since already started augmentin will switch to make sure infection is covered. Switch to zpak. Testing would not be benefical since already taken abx. I think diarrhea came as a side effect of abx. Discussed that if diarrhea continue to come in and get tested for c.diff because treated differently. Would love to give steroid but intolerance to steroids. Given cough syrup  and dicofenac to take as needed for pain/swelling.  Consider starting daily claritin/zyrtec for allergies.

## 2013-02-08 NOTE — Patient Instructions (Addendum)
Stop Augmentin. Start Zpak for 5 days. Take dicofenac as needed for pain and swelling. Use cough syrup as needed at bedtime.   Consider starting daily claritin/zyrtec for allergies.

## 2013-09-03 ENCOUNTER — Encounter: Payer: Self-pay | Admitting: Family Medicine

## 2013-09-03 ENCOUNTER — Ambulatory Visit (INDEPENDENT_AMBULATORY_CARE_PROVIDER_SITE_OTHER): Payer: BC Managed Care – PPO

## 2013-09-03 ENCOUNTER — Ambulatory Visit (INDEPENDENT_AMBULATORY_CARE_PROVIDER_SITE_OTHER): Payer: BC Managed Care – PPO | Admitting: Family Medicine

## 2013-09-03 VITALS — BP 123/74 | HR 89 | Wt 175.0 lb

## 2013-09-03 DIAGNOSIS — J3489 Other specified disorders of nose and nasal sinuses: Secondary | ICD-10-CM

## 2013-09-03 DIAGNOSIS — R2231 Localized swelling, mass and lump, right upper limb: Secondary | ICD-10-CM

## 2013-09-03 DIAGNOSIS — R51 Headache: Secondary | ICD-10-CM

## 2013-09-03 DIAGNOSIS — R229 Localized swelling, mass and lump, unspecified: Secondary | ICD-10-CM

## 2013-09-03 DIAGNOSIS — R0789 Other chest pain: Secondary | ICD-10-CM

## 2013-09-03 DIAGNOSIS — R232 Flushing: Secondary | ICD-10-CM

## 2013-09-03 DIAGNOSIS — N951 Menopausal and female climacteric states: Secondary | ICD-10-CM

## 2013-09-03 LAB — CBC WITH DIFFERENTIAL/PLATELET
Basophils Absolute: 0 10*3/uL (ref 0.0–0.1)
Eosinophils Absolute: 0.3 10*3/uL (ref 0.0–0.7)
Eosinophils Relative: 5 % (ref 0–5)
MCH: 30.3 pg (ref 26.0–34.0)
MCV: 89.3 fL (ref 78.0–100.0)
Platelets: 194 10*3/uL (ref 150–400)
RDW: 13 % (ref 11.5–15.5)

## 2013-09-03 LAB — TSH: TSH: 0.912 u[IU]/mL (ref 0.350–4.500)

## 2013-09-03 NOTE — Progress Notes (Signed)
Subjective:    Patient ID: Laura Johnston, female    DOB: 1971-03-31, 41 y.o.   MRN: [id number]  HPI knot on right deltiod x 1 yr feels like needles sticking her and burning. she is experiencing numbness in her forearm and the area only hurts if she presses on it on lies on it.   Otherwise not very bothersome. She has noticed some pain radiating across her upper back and into her left arm and wonders if it's related to the cyst. She's also been having more headaches than usual. See comments below. She is concerned that it could be related. She says she has been waking up at night worrying about not and whether not it could be cancer.   Having pressure in her face and head.  Has had a HA x 5 days.  No URI sxs. No fever. Worse when bends forward. Location changes from behind ears, top of head and then on side of head. She feels like a sharp pain in her head. Feels great in the AM, and in her head starts hurting as the day progresses and it seems to get worse. She's not been taking any medication for it.  She's also been drinking hot flashes over the last couple weeks. She says she has not had hot flashes and him as to year as she went through menopause in her late 35s. She's not sure what may have triggered it. It happened during the daytime and at night. She's also getting nauseated with a high flashes which is a little bit unusual for her. She'll sit complains of some chest burning. She denies any actual heartburn or brash. She says this feels different from the heartburn that she had during pregnancy. She says it just lasts for a few seconds and goes away. No known triggers or alleviating factors. She has not trainee TUMS or Maalox to see if this helps. She's also noticed an increase in burping. She says in fact burping seems to provide a little bit of relief.  Review of Systems BP 123/74  Pulse 89  Wt 175 lb (79.379 kg)  BMI 30.51 kg/m2  LMP 10/25/2011    Allergies  Allergen Reactions  .  Hydrocortisone   . Meloxicam     Paralysis  . Prednisone     Bilateral legs swollen.  . Pseudoephedrine     REACTION: numbness to fingers/tongue    Past Medical History  Diagnosis Date  . AMA (advanced maternal age) multigravida 35+   . Vaginal delivery 08/19/2011  . Gestational diabetes   . GERD (gastroesophageal reflux disease)   . Abnormal Pap smear     Past Surgical History  Procedure Laterality Date  . Appendectomy    . Knee surgery    . 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 Leep      History   Social History  . Marital Status: Single    Spouse Name: N/A    Number of Children: N/A  . Years of Education: N/A   Occupational History  . Not on file.   Social History Main Topics  . Smoking status: Former Games developer  . Smokeless tobacco: Never Used  . Alcohol Use: No  . Drug Use: No  . Sexual Activity: No   Other Topics Concern  . Not on file   Social History Narrative  . No narrative on file    Family History  Problem Relation Age of Onset  . Stroke Father   .  Hypertension Father   . Diabetes Maternal Uncle   . Stroke Paternal Grandmother     Outpatient Encounter Prescriptions as of 09/03/2013  Medication Sig Dispense Refill  . [DISCONTINUED] azithromycin (ZITHROMAX) 250 MG tablet 2 tabs today and then 1 tab for 4 days.  6 each  0  . [DISCONTINUED] chlorpheniramine-HYDROcodone (TUSSIONEX) 10-8 MG/5ML LQCR Take 5 mLs by mouth at bedtime as needed.  140 mL  0  . [DISCONTINUED] diclofenac (VOLTAREN) 75 MG EC tablet Take 1 tablet (75 mg total) by mouth 2 (two) times daily.  45 tablet  0   Facility-Administered Encounter Medications as of 09/03/2013  Medication Dose Route Frequency Provider Last Rate Last Dose  . lactated ringers infusion   Intravenous Continuous Michael A. Malen Gauze, MD 125 mL/hr at 08/21/11 1235             Objective:   Physical Exam  Constitutional: She is oriented to person, place, and time. She appears well-developed and  well-nourished.  HENT:  Head: Normocephalic and atraumatic.  Right Ear: External ear normal.  Left Ear: External ear normal.  Nose: Nose normal.  Mouth/Throat: Oropharynx is clear and moist.  TMs and canals are clear.   Eyes: Conjunctivae and EOM are normal. Pupils are equal, round, and reactive to light.  Neck: Neck supple. No thyromegaly present.  Cardiovascular: Normal rate, regular rhythm and normal heart sounds.   Pulmonary/Chest: Effort normal and breath sounds normal. She has no wheezes.  Lymphadenopathy:    She has no cervical adenopathy.  Neurological: She is alert and oriented to person, place, and time.  Skin: Skin is warm and dry.  Approximately 4-5 cm round smooth cyst on the left upper arm over the deltoid. Normal range of motion of the shoulder. The lesion is mildly tender. No surrounding erythema or rash. It is firm but not rockhard.  Psychiatric: She has a normal mood and affect.          Assessment & Plan:  Cyst on the left upper arm - discussed treatment options.  I gave her reassurance that most likely the cyst is benign. Has not changed size in a year, it has smooth regular borders. It is firm but not rockhard. She has not had strange or unusual fevers which is also reassuring. I do not think that this is at all connected to the bone but she is very worried about it so we will get an x-ray today just to make sure. I think it will provide her a fair amount of reassurance. We'll also check a CBC with differential.  Headaches-unclear etiology. She's having a lot of pressure in her sinuses and her years as well but her exam is normal today. Could be from weather change or shift. Can also be from fall allergies that she's not having any sneezing, itching, watery eyes. Again we'll check thyroid and CBC today.  Hot flashes-will check hormone levels to see if there any abnormal fluctuations. She should be technically menopausal. We'll also check thyroid level. her blood  pressures well controlled which looks great.   Burning in chest-will perform EKG today. EKG shows rate of 60 beats per minute, normal sinus rhythm with normal axis. No acute ST-T wave changes. Incomplete right bundle branch block. I do not have an old one for comparison. I still think this could be GERD related as she has been having some belching as well as the burning in the chest. But she feels adamantly leg this is very different from her  typical heartburn. Still encouraged her to try things like Maalox or TUMS to see if it provides any relief what happens.

## 2013-09-04 LAB — ESTRADIOL: Estradiol: <11.8 pg/mL

## 2013-09-05 ENCOUNTER — Ambulatory Visit: Payer: BC Managed Care – PPO | Admitting: Family Medicine

## 2013-09-05 ENCOUNTER — Telehealth: Payer: Self-pay | Admitting: *Deleted

## 2013-09-05 DIAGNOSIS — R0789 Other chest pain: Secondary | ICD-10-CM

## 2013-09-17 ENCOUNTER — Ambulatory Visit: Payer: BC Managed Care – PPO | Admitting: Family Medicine

## 2013-11-11 ENCOUNTER — Ambulatory Visit (INDEPENDENT_AMBULATORY_CARE_PROVIDER_SITE_OTHER): Payer: BC Managed Care – PPO | Admitting: Family Medicine

## 2013-11-11 ENCOUNTER — Encounter: Payer: Self-pay | Admitting: Family Medicine

## 2013-11-11 VITALS — BP 118/79 | HR 122 | Temp 101.8°F | Ht 64.0 in | Wt 173.0 lb

## 2013-11-11 DIAGNOSIS — J111 Influenza due to unidentified influenza virus with other respiratory manifestations: Secondary | ICD-10-CM

## 2013-11-11 DIAGNOSIS — R509 Fever, unspecified: Secondary | ICD-10-CM

## 2013-11-11 DIAGNOSIS — J029 Acute pharyngitis, unspecified: Secondary | ICD-10-CM

## 2013-11-11 LAB — POCT INFLUENZA A/B
Influenza A, POC: NEGATIVE
Influenza B, POC: NEGATIVE

## 2013-11-11 MED ORDER — HYDROCODONE-HOMATROPINE 5-1.5 MG/5ML PO SYRP: 5.0000 mL | ORAL_SOLUTION | Freq: Every evening | ORAL | Status: DC | PRN

## 2013-11-11 NOTE — Patient Instructions (Signed)

## 2013-11-11 NOTE — Progress Notes (Signed)
   Subjective:    Patient ID: Laura Johnston, female    DOB: 1971-05-21, 42 y.o.   MRN: [id number]  HPI Cough, fever, and bodyaches.  Tried hydrocodone but no other cough meds.  No diarrhea.  Has been constipated.  Son was sick 2 weeks ago.  +rhinnorhea. + fever.     Review of Systems     Objective:   Physical Exam  Constitutional: She is oriented to person, place, and time. She appears well-developed and well-nourished.  HENT:  Head: Normocephalic and atraumatic.  Right Ear: External ear normal.  Left Ear: External ear normal.  Nose: Nose normal.  Mouth/Throat: Oropharynx is clear and moist.  TMs and canals are clear.   Eyes: Conjunctivae and EOM are normal. Pupils are equal, round, and reactive to light.  Neck: Neck supple. No thyromegaly present.  Cardiovascular: Normal rate, regular rhythm and normal heart sounds.   Pulmonary/Chest: Effort normal and breath sounds normal. She has no wheezes.  Lymphadenopathy:    She has no cervical adenopathy.  Neurological: She is alert and oriented to person, place, and time.  Skin: Skin is warm and dry.  Psychiatric: She has a normal mood and affect.          Assessment & Plan:  Influenza like illness - neg for strep. Will test for flu.  Neg for flu.  Symptomatic care. H.O provided. Cough med given for bedtime.

## 2013-12-26 ENCOUNTER — Other Ambulatory Visit: Payer: Self-pay | Admitting: Obstetrics and Gynecology

## 2013-12-26 DIAGNOSIS — N644 Mastodynia: Secondary | ICD-10-CM

## 2014-01-02 ENCOUNTER — Ambulatory Visit
Admission: RE | Admit: 2014-01-02 | Discharge: 2014-01-02 | Disposition: A | Discharge status: Home / Self Care | Payer: BC Managed Care – PPO | Source: Ambulatory Visit | Attending: Obstetrics and Gynecology | Admitting: Obstetrics and Gynecology

## 2014-01-02 DIAGNOSIS — N644 Mastodynia: Secondary | ICD-10-CM

## 2014-03-03 ENCOUNTER — Emergency Department (INDEPENDENT_AMBULATORY_CARE_PROVIDER_SITE_OTHER)
Admission: EM | Admit: 2014-03-03 | Discharge: 2014-03-03 | Disposition: A | Discharge status: Hospice | Payer: BC Managed Care – PPO | Source: Home / Self Care | Attending: Family Medicine | Admitting: Family Medicine

## 2014-03-03 ENCOUNTER — Encounter: Payer: Self-pay | Admitting: Emergency Medicine

## 2014-03-03 DIAGNOSIS — R079 Chest pain, unspecified: Secondary | ICD-10-CM

## 2014-03-03 NOTE — ED Notes (Signed)
Patient came in very distraught, crying leaning up against the wall hunched over the front desk complaining of burning chest, neck and left arm pain. Jacquelynn Cree called my immediately I walked patient to room 1 she was very weak and tearful, advised Nana to call EMS. Started 2 litres of O2, Dr Assunta Found assessed, we gave her 1 nitro glycerin and 3 81mg  asa. Was about to start EKG when EMS arrived.  She was taken to [facility].

## 2014-03-03 NOTE — ED Notes (Signed)
Chest pain, burning left arm and neck pain started this morning

## 2014-03-04 NOTE — ED Provider Notes (Signed)
CSN: [id number]     Arrival date & time 03/03/14  1211 History   First MD Initiated Contact with Patient 03/03/14 1224     Chief Complaint  Patient presents with  . Chest Pain      HPI Comments: Patient was awakened 4am today by aching pain in her left shoulder and arm radiating to her left neck and jaw.  No shortness of breath. No nausea.  Her pain has persisted.  Patient is a 43 y.o. female presenting with chest pain. The history is provided by the patient.  Chest Pain Pain location:  L chest Pain quality: aching and burning   Pain radiates to:  L jaw, L arm and neck Pain radiates to the back: no   Pain severity:  Moderate Onset quality:  Sudden Duration:  8 hours Timing:  Constant Progression:  Unchanged Chronicity:  New Context comment:  Awakened from sleep Relieved by:  Nothing Worsened by:  Nothing tried Ineffective treatments:  None tried Associated symptoms: no abdominal pain, no back pain, no cough, no diaphoresis, no dysphagia, no fever, no headache, no nausea, no palpitations and no shortness of breath     Past Medical History  Diagnosis Date  . AMA (advanced maternal age) multigravida 74+   . Vaginal delivery 08/19/2011  . Gestational diabetes   . GERD (gastroesophageal reflux disease)   . Abnormal Pap smear    Past Surgical History  Procedure Laterality Date  . Appendectomy    . Knee surgery    . 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 Leep     Family History  Problem Relation Age of Onset  . Stroke Father   . Hypertension Father   . Diabetes Maternal Uncle   . Stroke Paternal Grandmother    History  Substance Use Topics  . Smoking status: Former Research scientist (life sciences)  . Smokeless tobacco: Never Used  . Alcohol Use: No   OB History   Grav Para Term Preterm Abortions TAB SAB Ect Mult Living   2 1 1  0 1 0 1 0 0 1     Review of Systems  Constitutional: Negative for fever and diaphoresis.  HENT: Negative for trouble swallowing.   Respiratory: Negative for cough  and shortness of breath.   Cardiovascular: Positive for chest pain. Negative for palpitations.  Gastrointestinal: Negative for nausea and abdominal pain.  Musculoskeletal: Negative for back pain.  Neurological: Negative for headaches.    Allergies  Hydrocortisone; Meloxicam; Prednisone; and Pseudoephedrine  Home Medications   Current Outpatient Rx  Name  Route  Sig  Dispense  Refill  . HYDROcodone-homatropine (HYCODAN) 5-1.5 MG/5ML syrup   Oral   Take 5 mLs by mouth at bedtime as needed for cough.   180 mL   0    BP 156/84  Pulse 88  Temp(Src) 98.7 F (37.1 C) (Oral)  SpO2 100%  LMP 10/25/2011 Physical Exam Nursing notes and Vital Signs reviewed. Appearance:  Patient appears stated age, and uncomfortable  Eyes:  Pupils are equal, round, and reactive to light and accomodation.  Extraocular movement is intact.  Conjunctivae are not inflamed   Pharynx:  Normal Neck:  Supple.  No adenopathy. Lungs:  Clear to auscultation.  Breath sounds are equal.  Heart:  Regular rate and rhythm without murmurs, rubs, or gallops.  Abdomen:  Nontender without masses or hepatosplenomegaly.  Bowel sounds are present.  No CVA or flank tenderness.  Extremities:  No edema.  No calf tenderness Skin:  No rash present.   ED Course  Procedures        MDM   1. Chest pain, unspecified; suspicious for cardiac etiology   O2 administered, NTG 0.4mg  SL administered, aspirin 81mg , 3 PO administered Patient transferred by rescue squad to [facility]    Kandra Nicolas, MD 03/12/14 [phone]

## 2014-09-29 ENCOUNTER — Encounter: Payer: Self-pay | Admitting: Emergency Medicine

## 2014-09-29 ENCOUNTER — Encounter: Payer: Self-pay | Admitting: *Deleted

## 2014-09-29 ENCOUNTER — Ambulatory Visit (INDEPENDENT_AMBULATORY_CARE_PROVIDER_SITE_OTHER): Payer: BC Managed Care – PPO | Admitting: Family Medicine

## 2014-09-29 VITALS — BP 112/77 | HR 100 | Temp 99.0°F | Wt 175.0 lb

## 2014-09-29 DIAGNOSIS — R05 Cough: Secondary | ICD-10-CM

## 2014-09-29 DIAGNOSIS — J019 Acute sinusitis, unspecified: Secondary | ICD-10-CM

## 2014-09-29 DIAGNOSIS — R059 Cough, unspecified: Secondary | ICD-10-CM

## 2014-09-29 DIAGNOSIS — J209 Acute bronchitis, unspecified: Secondary | ICD-10-CM

## 2014-09-29 DIAGNOSIS — J029 Acute pharyngitis, unspecified: Secondary | ICD-10-CM

## 2014-09-29 LAB — POCT INFLUENZA A/B
INFLUENZA A, POC: NEGATIVE
INFLUENZA B, POC: NEGATIVE

## 2014-09-29 MED ORDER — AMOXICILLIN-POT CLAVULANATE 875-125 MG PO TABS: 1.0000 | ORAL_TABLET | Freq: Two times a day (BID) | ORAL | Status: DC

## 2014-09-29 MED ORDER — HYDROCODONE-HOMATROPINE 5-1.5 MG/5ML PO SYRP: 5.0000 mL | ORAL_SOLUTION | Freq: Every evening | ORAL | Status: DC | PRN

## 2014-09-29 MED ORDER — BENZONATATE 200 MG PO CAPS: 200.0000 mg | ORAL_CAPSULE | Freq: Three times a day (TID) | ORAL | Status: DC | PRN

## 2014-09-29 NOTE — Progress Notes (Signed)
   Subjective:    Patient ID: Laura Johnston, female    DOB: 1971/06/15, 43 y.o.   MRN: [id number]  HPI Cough and ST started yesterday.  Coughed to the point of vomiting.  Had similar sxs 1 month ago.  Also had a sharp pain on her upper back as well.  Feels like a stabbing feeling when she coughs.  By last night she developed runny, thick green nasal discharge.  bilat sinus pain. + ear pain. Feels week.  No fever.  Has been feeling hot, cold and sweaty.  Feels like her heart is racing.  Son is in [place] in September.  Took an antibiotic from [place] about a month ago in September.  No prior history of asthma.  Used some old cough med and didn't help.  Occ smoker.    Review of Systems     Objective:   Physical Exam  Constitutional: She is oriented to person, place, and time. She appears well-developed and well-nourished.  HENT:  Head: Normocephalic and atraumatic.  Right Ear: External ear normal.  Left Ear: External ear normal.  Nose: Nose normal.  Mouth/Throat: Oropharynx is clear and moist.  TMs and canals are clear. Mild enlargement of tonsil but no sig erythema.   Eyes: Conjunctivae and EOM are normal. Pupils are equal, round, and reactive to light.  Neck: Neck supple. No thyromegaly present.  Cardiovascular: Normal rate, regular rhythm and normal heart sounds.   Pulmonary/Chest: Effort normal and breath sounds normal. She has no wheezes.  Lymphadenopathy:    She has no cervical adenopathy.  Neurological: She is alert and oriented to person, place, and time.  Skin: Skin is warm and dry.  Psychiatric: She has a normal mood and affect.          Assessment & Plan:  Acute sinusitis/bronchitis - unclear if a new viral illness or a double sickening from her recent illness.  Will cover for bacterial infection with augmentin. Call if not improving by the ned of the week.  Cough meds given. Consider CXR if not improving since has had persistant cough for over a month.

## 2014-10-06 ENCOUNTER — Ambulatory Visit (INDEPENDENT_AMBULATORY_CARE_PROVIDER_SITE_OTHER): Payer: BC Managed Care – PPO | Admitting: Family Medicine

## 2014-10-06 ENCOUNTER — Encounter: Payer: Self-pay | Admitting: Family Medicine

## 2014-10-06 ENCOUNTER — Telehealth: Payer: Self-pay | Admitting: *Deleted

## 2014-10-06 ENCOUNTER — Ambulatory Visit (INDEPENDENT_AMBULATORY_CARE_PROVIDER_SITE_OTHER): Payer: BC Managed Care – PPO

## 2014-10-06 ENCOUNTER — Other Ambulatory Visit: Payer: Self-pay | Admitting: Family Medicine

## 2014-10-06 VITALS — BP 106/67 | HR 83 | Temp 97.9°F | Wt 178.0 lb

## 2014-10-06 DIAGNOSIS — R05 Cough: Secondary | ICD-10-CM

## 2014-10-06 DIAGNOSIS — R053 Chronic cough: Secondary | ICD-10-CM

## 2014-10-06 DIAGNOSIS — Z87891 Personal history of nicotine dependence: Secondary | ICD-10-CM

## 2014-10-06 DIAGNOSIS — R0602 Shortness of breath: Secondary | ICD-10-CM

## 2014-10-06 MED ORDER — AZITHROMYCIN 250 MG PO TABS: ORAL_TABLET | ORAL | Status: DC

## 2014-10-06 NOTE — Addendum Note (Signed)
Addended by: Narda Rutherford on: 10/06/2014 04:12 PM   Modules accepted: Orders

## 2014-10-06 NOTE — Telephone Encounter (Signed)
Pt called back and stated that she would prefer to go forward with the testing now instead of waiting until after finishing the ABX. She said she has a 44 yo at home and doesn't want to pass this on to him and he is already coughing. Pt advised to come in today for testing.Laura Johnston [place]

## 2014-10-06 NOTE — Patient Instructions (Signed)
Can try mucinex to help move out as well.

## 2014-10-06 NOTE — Progress Notes (Signed)
   Subjective:    Patient ID: Laura Johnston, female    DOB: 12/15/1970, 43 y.o.   MRN: [id number]  HPI Follow-up for sinusitis and bronchitis-she's been on her Augmentin for about 7 days, and using the cough medications. She does feel some better but the cough is still severe. She still getting some thick yellow and white mucus. No fever, chills or sweats. Wants to know if ok to go to work.   Review of Systems     Objective:   Physical Exam  Constitutional: She is oriented to person, place, and time. She appears well-developed and well-nourished.  HENT:  Head: Normocephalic and atraumatic.  Right Ear: External ear normal.  Left Ear: External ear normal.  Nose: Nose normal.  Mouth/Throat: Oropharynx is clear and moist.  TMs and canals are clear.   Eyes: Conjunctivae and EOM are normal. Pupils are equal, round, and reactive to light.  Neck: Neck supple. No thyromegaly present.  Cardiovascular: Normal rate, regular rhythm and normal heart sounds.   Pulmonary/Chest: Effort normal and breath sounds normal. She has no wheezes.  Lymphadenopathy:    She has no cervical adenopathy.  Neurological: She is alert and oriented to person, place, and time.  Skin: Skin is warm and dry.  Psychiatric: She has a normal mood and affect.          Assessment & Plan:  Cough - persistent . We'll move forward with chest x-rays and she's had a cough for over a month at this point. She's on 7 days of Augmentin with no improvement. Call with results once available and adjust her regimen. Continue use cough suppressants. She asked if there was any expectorant that she could use. Recommend a trial of plain Mucinex.

## 2014-10-07 ENCOUNTER — Telehealth: Payer: Self-pay | Admitting: *Deleted

## 2014-10-07 NOTE — Telephone Encounter (Signed)
Pt informed that her results have not come back for pertussis and we will call when they do. She was advised that she can return to work if she is up to it.Laura Johnston [place]

## 2014-10-21 LAB — BORDETELLA PERTUSSIS PCR
B PERTUSSIS, DNA: NOT DETECTED
B parapertussis, DNA: NOT DETECTED

## 2014-10-21 LAB — CULTURE, BORDETELLA PERTUSSIS

## 2015-03-18 ENCOUNTER — Other Ambulatory Visit: Payer: Self-pay | Admitting: Obstetrics and Gynecology

## 2015-03-22 LAB — CYTOLOGY - PAP

## 2015-03-31 LAB — LIPID PANEL
Cholesterol: 221 mg/dL — AB (ref 0–200)
HDL: 47 mg/dL (ref 35–70)
LDL Cholesterol: 129 mg/dL
Triglycerides: 225 mg/dL — AB (ref 40–160)

## 2015-03-31 LAB — BASIC METABOLIC PANEL: Glucose: 90 mg/dL

## 2015-05-22 ENCOUNTER — Encounter: Payer: Self-pay | Admitting: Family Medicine

## 2015-05-22 ENCOUNTER — Ambulatory Visit (INDEPENDENT_AMBULATORY_CARE_PROVIDER_SITE_OTHER): Payer: BLUE CROSS/BLUE SHIELD | Admitting: Family Medicine

## 2015-05-22 VITALS — BP 126/74 | HR 96 | Wt 182.0 lb

## 2015-05-22 DIAGNOSIS — Z8249 Family history of ischemic heart disease and other diseases of the circulatory system: Secondary | ICD-10-CM

## 2015-05-22 DIAGNOSIS — Z6831 Body mass index (BMI) 31.0-31.9, adult: Secondary | ICD-10-CM | POA: Diagnosis not present

## 2015-05-22 DIAGNOSIS — S93402A Sprain of unspecified ligament of left ankle, initial encounter: Secondary | ICD-10-CM | POA: Diagnosis not present

## 2015-05-22 DIAGNOSIS — E785 Hyperlipidemia, unspecified: Secondary | ICD-10-CM

## 2015-05-22 DIAGNOSIS — Z720 Tobacco use: Secondary | ICD-10-CM

## 2015-05-22 NOTE — Progress Notes (Signed)
Subjective:    Patient ID: Laura Johnston, female    DOB: 05/06/1971, 44 y.o.   MRN: [id number]  HPI Noticed some ankle swelling on Wednesday after camping. No injury or trauma.  No meds for it. Feels like the ball of the foot is swollen. The swelling is better first thing in the morning as she gets up on it it gets worse. She says that she continues to her day it gets so swollen that it becomes tight and very uncomfortable.  She also brought in a copy of her blood work that she had done through work. She was motivated to do this because her father died from heart disease/year and she is very concerned about that. It tends to run in her family. Her cholesterol was elevated and her BMI was up. She wants to discuss things that can help prevent heart disease. She would also like to be evaluated for heart disease. She said that in [place] she was told by her father's doctors that she need to get tested.  Family history updated below.   Review of Systems BP 126/74 mmHg  Pulse 96  Wt 182 lb (82.555 kg)  LMP 10/25/2011    Allergies  Allergen Reactions  . Hydrocortisone   . Meloxicam     Paralysis  . Prednisone     Bilateral legs swollen.  . Pseudoephedrine     REACTION: numbness to fingers/tongue    Past Medical History  Diagnosis Date  . AMA (advanced maternal age) multigravida 50+   . Vaginal delivery 08/19/2011  . Gestational diabetes   . GERD (gastroesophageal reflux disease)   . Abnormal Pap smear     Past Surgical History  Procedure Laterality Date  . Appendectomy    . Knee surgery    . 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 Leep      History   Social History  . Marital Status: Single    Spouse Name: N/A  . Number of Children: N/A  . Years of Education: N/A   Occupational History  . Not on file.   Social History Main Topics  . Smoking status: Former Research scientist (life sciences)  . Smokeless tobacco: Never Used  . Alcohol Use: No  . Drug Use: No  . Sexual Activity: No   Other  Topics Concern  . Not on file   Social History Narrative    Family History  Problem Relation Age of Onset  . Stroke Father   . Hypertension Father   . Diabetes Maternal Uncle   . Stroke Paternal Grandmother   . Hyperlipidemia Father   . Heart disease Father     Outpatient Encounter Prescriptions as of 05/22/2015  Medication Sig  . hydrocortisone (ANUSOL-HC) 25 MG suppository INSERT 1 SUPPOSITORY RECTALLY 2 TIMES DAILY.  . [DISCONTINUED] azithromycin (ZITHROMAX) 250 MG tablet 2 tabs on Day 1, the one a day x 4 days.  . [DISCONTINUED] benzonatate (TESSALON) 200 MG capsule Take 1 capsule (200 mg total) by mouth 3 (three) times daily as needed for cough.  . [DISCONTINUED] HYDROcodone-homatropine (HYCODAN) 5-1.5 MG/5ML syrup Take 5 mLs by mouth at bedtime as needed for cough.   Facility-Administered Encounter Medications as of 05/22/2015  Medication  . lactated ringers infusion          Objective:   Physical Exam  Constitutional: She is oriented to person, place, and time. She appears well-developed and well-nourished.  HENT:  Head: Normocephalic and atraumatic.  Cardiovascular: Normal rate, regular  rhythm and normal heart sounds.   No carotid bruits.   Pulmonary/Chest: Effort normal and breath sounds normal.  Musculoskeletal:  Left ankle with normal range of motion. She has some swelling over the lateral malleolus. She's tender just inferior and anterior to the lateral malleolus. She also has pain mostly with eversion of the foot. Dorsal pedal pulse 2+  Neurological: She is alert and oriented to person, place, and time.  Skin: Skin is warm and dry.  Psychiatric: She has a normal mood and affect. Her behavior is normal.          Assessment & Plan:  Lateral ankle sprain-discussed diagnosis. Given ankle sleeve that laces up for comfort and to help with swelling. Recommend elevate as able and to take her diclofenac up to twice a day as needed for the next week or 2. I did  give her handout on some stretches and exercises to do on her ankle as she is starting to feel better. She is able to walk on it so I don't think she needs crutches at this point. Follow-up in about 3 weeks if she's not improving. No need for x-ray today based on exam.  Hyperlipidemia-discussed treatment strategies. Really like her to focus on diet and exercise and weight loss first for the next 6 months and then consider rechecking the levels. But certainly with her family history of heart disease if she would like to consider taking a statin I think that that is perfectly reasonable. Encouraged her to really think about it.  Family history of heart disease. Discussed with her that we don't normally do catheterizations etc. for people who are asymptomatic but certainly we could do a treadmill stress test since she is at risk. She's not been having any chest pain herself. We discussed the importance of smoking cessation, controlling her blood pressure, controlling her cholesterol and living a healthy lifestyle controlling her weight in managing her diet. Next  BMI of 31-discussed the importance of tracking calories to help her meet her weight loss goals. She's also not exercising at all and actually has a very sedentary job. We discussed strategies to become more active once her ankle is better. Also recommended the smart phone application called my fitness pal to help percent caloric goals and set weight loss goals.  Tobacco abuse-discourage smoking and encouraged cessation. She actually smoked about 2 packs per day for about 15 years and then actually quit for about 15 years. She started back and smokes occasionally.  Time spent  45 min, >50% spent counseling about her disease, hyperlipidemia, and weight gain/BMI 31 45 min.

## 2015-05-22 NOTE — Patient Instructions (Signed)
Can use My Fitness pal to track your calories. It is a free smart phone app

## 2015-05-27 ENCOUNTER — Encounter: Payer: Self-pay | Admitting: Family Medicine

## 2016-02-19 ENCOUNTER — Ambulatory Visit (INDEPENDENT_AMBULATORY_CARE_PROVIDER_SITE_OTHER): Payer: BLUE CROSS/BLUE SHIELD | Admitting: Family Medicine

## 2016-02-19 ENCOUNTER — Ambulatory Visit (INDEPENDENT_AMBULATORY_CARE_PROVIDER_SITE_OTHER): Payer: BLUE CROSS/BLUE SHIELD

## 2016-02-19 ENCOUNTER — Encounter: Payer: Self-pay | Admitting: Family Medicine

## 2016-02-19 VITALS — BP 130/80 | HR 86 | Wt 182.0 lb

## 2016-02-19 DIAGNOSIS — M4184 Other forms of scoliosis, thoracic region: Secondary | ICD-10-CM | POA: Diagnosis not present

## 2016-02-19 DIAGNOSIS — M542 Cervicalgia: Secondary | ICD-10-CM | POA: Diagnosis not present

## 2016-02-19 DIAGNOSIS — M546 Pain in thoracic spine: Secondary | ICD-10-CM

## 2016-02-19 DIAGNOSIS — S46812A Strain of other muscles, fascia and tendons at shoulder and upper arm level, left arm, initial encounter: Secondary | ICD-10-CM

## 2016-02-19 DIAGNOSIS — M549 Dorsalgia, unspecified: Secondary | ICD-10-CM

## 2016-02-19 MED ORDER — NAPROXEN 500 MG PO TABS: 500.0000 mg | ORAL_TABLET | Freq: Two times a day (BID) | ORAL | Status: DC

## 2016-02-19 MED ORDER — CYCLOBENZAPRINE HCL 10 MG PO TABS: 10.0000 mg | ORAL_TABLET | Freq: Two times a day (BID) | ORAL | Status: DC | PRN

## 2016-02-19 NOTE — Progress Notes (Signed)
   Subjective:    Patient ID: Laura Johnston, female    DOB: Nov 09, 1971, 45 y.o.   MRN: [id number]  HPI  Patient comes in today complaining of left-sided neck and upper trapezius pain and pain between the spine and the shoulder blade on the left side. She said that she had neck problems years ago. I was able to find an MRI from 2009 of the cervical spine which did show some disc protrusion at C4-5, C5-6, C6-7. She said she had further imaging done again in 2015 about a year and a half ago and actually had an injection done at [place] which did provide relief. She really didn't have any problems up until about 2 days ago. She denies any specific injury. She thinks she may have just slept on it wrong. But it's been bothering her since then. The pain in the posterior shoulder between the shoulder blades she describes as more of a sharp pain. She has an device at home to help stretch her neck. She used it last night and says she did get some relief in her neck but not in the area between the shoulder blade and spine. She did diet try taking diclofenac 75 mg last night but says it did not provide any relief. No numbness or tingling into the arms or legs.  Review of Systems     Objective:   Physical Exam  Constitutional: She is oriented to person, place, and time. She appears well-developed and well-nourished.  HENT:  Head: Normocephalic and atraumatic.  Eyes: Conjunctivae and EOM are normal.  Cardiovascular: Normal rate.   Pulmonary/Chest: Effort normal.  Musculoskeletal:  Nontender over the cervical spine or upper thoracic spine. She is tender along the left cervical paraspinous muscles and over the upper trapezius. She has palpable tender and tense knots in the muscles between the scapula and the spine on the left side above the bra line. She is able to flex the neck without any difficulty. But she did have significant pain and discomfort with extension and had limited extension.  Normal rotation to the right but decreased to the left. Shoulder range of motion normal bilaterally. Strength in upper extremities normal bilaterally.  Neurological: She is alert and oriented to person, place, and time.  Skin: Skin is dry. No pallor.  Psychiatric: She has a normal mood and affect. Her behavior is normal.  Vitals reviewed.         Assessment & Plan:  Trapezius spasm/strain-recommend conservative treatment with NSAID, muscle relaxer, stretches, heat/ice. She would also like to go ahead and get an x-ray area we discussed pros and cons and she did not actually have a new injury. But certainly with her prior history of may be worthwhile.

## 2016-02-29 DIAGNOSIS — M25561 Pain in right knee: Secondary | ICD-10-CM | POA: Diagnosis not present

## 2016-02-29 DIAGNOSIS — M2391 Unspecified internal derangement of right knee: Secondary | ICD-10-CM | POA: Diagnosis not present

## 2016-02-29 DIAGNOSIS — M25461 Effusion, right knee: Secondary | ICD-10-CM | POA: Diagnosis not present

## 2016-02-29 DIAGNOSIS — F17200 Nicotine dependence, unspecified, uncomplicated: Secondary | ICD-10-CM | POA: Diagnosis not present

## 2016-03-01 ENCOUNTER — Ambulatory Visit: Payer: BLUE CROSS/BLUE SHIELD | Admitting: Rehabilitative and Restorative Service Providers"

## 2016-03-09 ENCOUNTER — Other Ambulatory Visit: Payer: Self-pay | Admitting: Family Medicine

## 2016-03-09 ENCOUNTER — Ambulatory Visit (INDEPENDENT_AMBULATORY_CARE_PROVIDER_SITE_OTHER): Payer: BLUE CROSS/BLUE SHIELD | Admitting: Family Medicine

## 2016-03-09 ENCOUNTER — Encounter: Payer: Self-pay | Admitting: Family Medicine

## 2016-03-09 VITALS — BP 103/71 | HR 81 | Wt 181.0 lb

## 2016-03-09 DIAGNOSIS — M25561 Pain in right knee: Secondary | ICD-10-CM | POA: Diagnosis not present

## 2016-03-09 DIAGNOSIS — Z139 Encounter for screening, unspecified: Secondary | ICD-10-CM

## 2016-03-09 MED ORDER — CELECOXIB 200 MG PO CAPS: 200.0000 mg | ORAL_CAPSULE | Freq: Two times a day (BID) | ORAL | Status: DC | PRN

## 2016-03-09 MED ORDER — TIZANIDINE HCL 4 MG PO CAPS: 4.0000 mg | ORAL_CAPSULE | Freq: Two times a day (BID) | ORAL | Status: DC | PRN

## 2016-03-09 NOTE — Progress Notes (Signed)
Subjective:    Patient ID: Laura Johnston, female    DOB: 1971-03-07, 45 y.o.   MRN: [id number]  HPI  45 year old female comes in today to follow-up from emergency department visit for right knee pain. She reported that she was sitting in a chair and slid over and she felt a twisting sensation in her right knee when she tried to stand up . She flet a severe pop. She had immediate pain and then difficulty ambulating right afterwards. He did have an x-ray of her knee which was negative for any type of fracture or malalignment. Is a little bit of joint fluid effusion. She was given an Ace wrap and crutches and told to follow-up with primary care.  Review of Systems BP 103/71 mmHg  Pulse 81  Wt 181 lb (82.101 kg)  SpO2 99%  LMP 10/25/2011    Allergies  Allergen Reactions  . Hydrocortisone   . Meloxicam     Paralysis  . Prednisone     Bilateral legs swollen.  . Pseudoephedrine     REACTION: numbness to fingers/tongue    Past Medical History  Diagnosis Date  . AMA (advanced maternal age) multigravida 15+   . Vaginal delivery 08/19/2011  . Gestational diabetes   . GERD (gastroesophageal reflux disease)   . Abnormal Pap smear     Past Surgical History  Procedure Laterality Date  . Appendectomy    . Knee surgery    . 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 Leep      Social History   Social History  . Marital Status: Single    Spouse Name: N/A  . Number of Children: N/A  . Years of Education: N/A   Occupational History  . Not on file.   Social History Main Topics  . Smoking status: Former Research scientist (life sciences)  . Smokeless tobacco: Never Used  . Alcohol Use: No  . Drug Use: No  . Sexual Activity: No   Other Topics Concern  . Not on file   Social History Narrative    Family History  Problem Relation Age of Onset  . Stroke Father   . Hypertension Father   . Diabetes Maternal Uncle   . Stroke Paternal Grandmother   . Hyperlipidemia Father   . Heart disease Father      Outpatient Encounter Prescriptions as of 03/09/2016  Medication Sig  . cyclobenzaprine (FLEXERIL) 10 MG tablet Take 1 tablet (10 mg total) by mouth 2 (two) times daily as needed for muscle spasms.  . naproxen (NAPROSYN) 500 MG tablet Take 1 tablet (500 mg total) by mouth 2 (two) times daily with a meal.  . celecoxib (CELEBREX) 200 MG capsule Take 1 capsule (200 mg total) by mouth 2 (two) times daily as needed.   Facility-Administered Encounter Medications as of 03/09/2016  Medication  . lactated ringers infusion           Objective:   Physical Exam  Constitutional: She is oriented to person, place, and time. She appears well-developed and well-nourished.  HENT:  Head: Normocephalic and atraumatic.  Eyes: Conjunctivae and EOM are normal.  Cardiovascular: Normal rate.   Pulmonary/Chest: Effort normal.  Musculoskeletal:  Right knee with normal extension and slightly decreased flexion due to pain. She has some pain just above the medial joint line. She also has a small bruise there. She also has a bruise over the mid shin area and is mildly tender there as well. She's also slightly  tender over the lateral joint line. Nontender over the patella. She does have an old surgical scar where she said she had a tendon repaired previously. Some trace swelling. Negative McMurray's. Strength is 5 out of 5 at the 5 knee and ankle.  Neurological: She is alert and oriented to person, place, and time.  Skin: Skin is dry. No pallor.  Psychiatric: She has a normal mood and affect. Her behavior is normal.  Vitals reviewed.      Assessment & Plan:  Right knee pain - Suspect either ligament strain or tear versus cartilage injury. No significant increased laxity on exam today but she does have some bruising over the medial part of the knee as well as over the mid shin which is unusual concern she did not have any impact trauma to the area. Because she still having quite significant pain I think we  should go ahead and move forward with an MRI for further evaluation. Continue Ace wrap. We rewrapped it today for comfort and support. She would like to switch anti-inflammatories so will change her to Celebrex instead. New prescription sent to the pharmacy. Recommend icing the area.

## 2016-03-09 NOTE — Telephone Encounter (Signed)
Prescribed 02/19/16 for neck spasm. Please advise.

## 2016-03-09 NOTE — Telephone Encounter (Signed)
She reported that she actually felt hyper on the Flexeril. Will try Zanaflex.

## 2016-03-09 NOTE — Patient Instructions (Signed)
Will change to Celebrex instead of naproxen for inflammation and pain relief. Ice the area as needed.

## 2016-03-14 ENCOUNTER — Ambulatory Visit: Payer: BLUE CROSS/BLUE SHIELD

## 2016-03-14 ENCOUNTER — Ambulatory Visit: Payer: BLUE CROSS/BLUE SHIELD | Admitting: Family Medicine

## 2016-03-14 ENCOUNTER — Telehealth: Payer: Self-pay

## 2016-03-14 ENCOUNTER — Ambulatory Visit (INDEPENDENT_AMBULATORY_CARE_PROVIDER_SITE_OTHER): Payer: BLUE CROSS/BLUE SHIELD

## 2016-03-14 DIAGNOSIS — Z139 Encounter for screening, unspecified: Secondary | ICD-10-CM | POA: Diagnosis not present

## 2016-03-14 DIAGNOSIS — M795 Residual foreign body in soft tissue: Secondary | ICD-10-CM | POA: Diagnosis not present

## 2016-03-14 DIAGNOSIS — M25561 Pain in right knee: Secondary | ICD-10-CM

## 2016-03-14 NOTE — Telephone Encounter (Signed)
Helen from imaging called. Patient was not able to have MRI due to staples in the knee.

## 2016-03-14 NOTE — Telephone Encounter (Signed)
Ok, then lets see if she wants to go back to see ortho. Ok  To place referral.

## 2016-03-15 ENCOUNTER — Telehealth: Payer: Self-pay | Admitting: *Deleted

## 2016-03-15 NOTE — Telephone Encounter (Signed)
Pt advised of recommendation, would like to go to [facility]. Does not have a preference in Provider.

## 2016-03-15 NOTE — Telephone Encounter (Signed)
Prior Auth initiated and approved for celecoxib 200 mg cap  Your request has been approved  CaseId:[id number];Product Name:ST: Celebrex (celecoxib) NoGF (webfax);Status:Approved;Coverage Start Date:02/14/2016;Coverage End Date:03/15/2017;   Pt notified. Pt also asked it [facility] was going to "approve the muscle relaxer." I advised pt that I didn't received notification about a PA for muscle relaxer only celebrex. Pt states she still has pills left but she wants to know if [facility]ney in other words would approve a refill. I advised that she may but at any rated it could not be filled until 4-24. Pt voiced understanding

## 2016-03-15 NOTE — Addendum Note (Signed)
Addended by: Huel Cote on: 03/15/2016 02:16 PM   Modules accepted: Orders

## 2016-03-15 NOTE — Telephone Encounter (Signed)
Patient advised.

## 2016-03-22 DIAGNOSIS — Z6831 Body mass index (BMI) 31.0-31.9, adult: Secondary | ICD-10-CM | POA: Diagnosis not present

## 2016-03-22 DIAGNOSIS — Z1231 Encounter for screening mammogram for malignant neoplasm of breast: Secondary | ICD-10-CM | POA: Diagnosis not present

## 2016-03-22 DIAGNOSIS — Z01419 Encounter for gynecological examination (general) (routine) without abnormal findings: Secondary | ICD-10-CM | POA: Diagnosis not present

## 2016-03-22 DIAGNOSIS — Z0000 Encounter for general adult medical examination without abnormal findings: Secondary | ICD-10-CM | POA: Diagnosis not present

## 2016-03-22 LAB — HEPATIC FUNCTION PANEL
ALK PHOS: 55 U/L (ref 25–125)
ALT: 69 U/L — AB (ref 7–35)
AST: 45 U/L — AB (ref 13–35)
BILIRUBIN, TOTAL: 0.4 mg/dL

## 2016-03-22 LAB — BASIC METABOLIC PANEL
BUN: 11 mg/dL (ref 4–21)
Creatinine: 0.6 mg/dL (ref 0.5–1.1)
Glucose: 93 mg/dL
Potassium: 4.1 mmol/L (ref 3.4–5.3)
Sodium: 138 mmol/L (ref 137–147)

## 2016-03-22 LAB — TSH: TSH: 1 u[IU]/mL (ref 0.41–5.90)

## 2016-03-22 LAB — HEMOGLOBIN A1C: HEMOGLOBIN A1C: 5.6

## 2016-04-05 ENCOUNTER — Other Ambulatory Visit: Payer: Self-pay | Admitting: General Surgery

## 2016-04-05 DIAGNOSIS — D1720 Benign lipomatous neoplasm of skin and subcutaneous tissue of unspecified limb: Secondary | ICD-10-CM | POA: Diagnosis not present

## 2016-04-05 DIAGNOSIS — D1721 Benign lipomatous neoplasm of skin and subcutaneous tissue of right arm: Secondary | ICD-10-CM | POA: Diagnosis not present

## 2016-04-06 DIAGNOSIS — Z13820 Encounter for screening for osteoporosis: Secondary | ICD-10-CM | POA: Diagnosis not present

## 2016-04-12 ENCOUNTER — Ambulatory Visit
Admission: RE | Admit: 2016-04-12 | Discharge: 2016-04-12 | Disposition: A | Discharge status: Home / Self Care | Payer: BLUE CROSS/BLUE SHIELD | Source: Ambulatory Visit | Attending: General Surgery | Admitting: General Surgery

## 2016-04-12 DIAGNOSIS — D1721 Benign lipomatous neoplasm of skin and subcutaneous tissue of right arm: Secondary | ICD-10-CM

## 2016-04-12 DIAGNOSIS — R2231 Localized swelling, mass and lump, right upper limb: Secondary | ICD-10-CM | POA: Diagnosis not present

## 2016-05-18 DIAGNOSIS — D1720 Benign lipomatous neoplasm of skin and subcutaneous tissue of unspecified limb: Secondary | ICD-10-CM | POA: Diagnosis not present

## 2016-05-23 DIAGNOSIS — R799 Abnormal finding of blood chemistry, unspecified: Secondary | ICD-10-CM | POA: Diagnosis not present

## 2016-05-23 DIAGNOSIS — E559 Vitamin D deficiency, unspecified: Secondary | ICD-10-CM | POA: Diagnosis not present

## 2016-05-23 LAB — VITAMIN D, 1,25 + 25-HYDROXY: Vitamin D 1, 25 (OH)2 Total: 21

## 2016-06-16 ENCOUNTER — Telehealth: Payer: Self-pay | Admitting: *Deleted

## 2016-06-16 DIAGNOSIS — R945 Abnormal results of liver function studies: Principal | ICD-10-CM

## 2016-06-16 DIAGNOSIS — R7989 Other specified abnormal findings of blood chemistry: Secondary | ICD-10-CM

## 2016-06-16 NOTE — Telephone Encounter (Signed)
Looked in basket and pt's results are in pcp's basket. Laura Johnston, Laura Johnston

## 2016-06-16 NOTE — Telephone Encounter (Signed)
Pt stated that her GYN did some labs and told her that her liver enzymes were elevated and the results were sent to Dr. Madilyn Fireman. They told her that she would need to f/u with a liver specialist to get this checked out. She stated that she is seen by Dr. Everlene Farrier. Pt reports that her results were faxed over on Monday or Tuesday and she also informed me that her last name has changed it is now Katanya Noemi. I told her that if we had received this information that Dr. Madilyn Fireman would have certainly been calling to discuss her options with her. I will fwd to pcp for advice.Audelia Hives [place]

## 2016-06-17 NOTE — Telephone Encounter (Signed)
Called pt and informed her of recommendations. She would like to proceed with the [place]. She is taking a Vit D supplement that her GYN started her on. Will place the order for [place].Laura Johnston

## 2016-06-17 NOTE — Telephone Encounter (Signed)
Call patient: I did receive her labs and reviewed them. Her liver enzymes were just mildly elevated. Nothing in the dangerous area. I did go back and look at some lab work that we have done a few years ago here in our office and the liver enzymes were mildly elevated then. Not quite as high as they are this time. I recommend further workup including an ultrasound to take a look at the contours and density of the liver. The most common cause for elevated liver enzymes, in someone who does not use alcohol, is actually a fatty liver. This is primarily treated with a low-fat diet regular exercise and weight loss so that should definitely be our goal to improve the inflammation in the liver. But I do think and so important to work this up further. We do not need to involve a specialist at this point without some additional workup and then we can decide if we need a consultation or not. If she is okay with this and I would recommend that we schedule her for abdominal ultrasound downstairs. Also on her labs her vitamin D was low. Encouraged her to take a supplement if her GYN has not already notified her.

## 2016-06-22 ENCOUNTER — Ambulatory Visit (INDEPENDENT_AMBULATORY_CARE_PROVIDER_SITE_OTHER): Payer: BLUE CROSS/BLUE SHIELD

## 2016-06-22 DIAGNOSIS — K820 Obstruction of gallbladder: Secondary | ICD-10-CM

## 2016-06-22 DIAGNOSIS — N281 Cyst of kidney, acquired: Secondary | ICD-10-CM | POA: Diagnosis not present

## 2016-06-22 DIAGNOSIS — R7989 Other specified abnormal findings of blood chemistry: Secondary | ICD-10-CM

## 2016-06-22 DIAGNOSIS — R945 Abnormal results of liver function studies: Principal | ICD-10-CM

## 2016-06-22 NOTE — Addendum Note (Signed)
Addended by: Teddy Spike on: 06/22/2016 12:54 PM   Modules accepted: Orders

## 2016-07-20 ENCOUNTER — Encounter: Payer: Self-pay | Admitting: Family Medicine

## 2016-07-20 DIAGNOSIS — E559 Vitamin D deficiency, unspecified: Secondary | ICD-10-CM

## 2016-08-08 DIAGNOSIS — H40013 Open angle with borderline findings, low risk, bilateral: Secondary | ICD-10-CM | POA: Diagnosis not present

## 2016-08-31 ENCOUNTER — Encounter: Payer: Self-pay | Admitting: Family Medicine

## 2016-08-31 ENCOUNTER — Ambulatory Visit (INDEPENDENT_AMBULATORY_CARE_PROVIDER_SITE_OTHER): Payer: BLUE CROSS/BLUE SHIELD | Admitting: Family Medicine

## 2016-08-31 VITALS — BP 111/69 | HR 67 | Ht 64.0 in | Wt 178.0 lb

## 2016-08-31 DIAGNOSIS — R945 Abnormal results of liver function studies: Principal | ICD-10-CM

## 2016-08-31 DIAGNOSIS — K760 Fatty (change of) liver, not elsewhere classified: Secondary | ICD-10-CM | POA: Diagnosis not present

## 2016-08-31 DIAGNOSIS — M545 Low back pain, unspecified: Secondary | ICD-10-CM

## 2016-08-31 DIAGNOSIS — R7989 Other specified abnormal findings of blood chemistry: Secondary | ICD-10-CM

## 2016-08-31 DIAGNOSIS — G8929 Other chronic pain: Secondary | ICD-10-CM

## 2016-08-31 DIAGNOSIS — R635 Abnormal weight gain: Secondary | ICD-10-CM

## 2016-08-31 DIAGNOSIS — Z6830 Body mass index (BMI) 30.0-30.9, adult: Secondary | ICD-10-CM

## 2016-08-31 NOTE — Progress Notes (Signed)
Subjective:    CC:   HPI: US showed fatty liver pt to have repeat labs done in 3 mos after (09/22/16).She's really tried to make some major dietary changes and reduce her carbohydrate intake and work on weight loss. She is not exercising at all.  She  has been having some right sided back pain. We have worked this up previously and done an x-ray back in April showing some degenerative disc as well as some arthritis in the spine. I have recommended physical therapy at that point in time. She says by the time she actually got the appointment she was feeling better and so did not go. She is now having pain again. She says she'll even notice it with taking a deep breath. She wanted to make sure it wasn't related to her fatty liver.  Abnormal weight gain - she has been trying to use [employer]itness [employer] to lose weight. So far has only lost about 5 lbs.  She is switched to coconut milk in her breakfast cereal. She has tried to increase her vegetable intake. She does feel like she does a good job eating protein. Her fitness pal set to around 1200 cal per day. She is not exercising at all.   Past medical history, Surgical history, Family history not pertinant except as noted below, Social history, Allergies, and medications have been entered into the medical record, reviewed, and corrections made.   Review of Systems: No fevers, chills, night sweats, weight loss, chest pain, or shortness of breath.   Objective:    General: Well Developed, well nourished, and in no acute distress.  Neuro: Alert and oriented x3, extra-ocular muscles intact, sensation grossly intact.  HEENT: Normocephalic, atraumatic  Skin: Warm and dry, no rashes. Cardiac: Regular rate and rhythm, no murmurs rubs or gallops, no lower extremity edema.  Respiratory: Clear to auscultation bilaterally. Not using accessory muscles, speaking in full sentences.   Impression and Recommendations:    Fatty liver - due for repeat enzymes. It does  somewhat she's made some good dietary changes that hopefully this will help. We again reviewed the importance of weight loss healthy diet and regular exercise to reduce fat and inflammation in the liver.  Abnormal weight gain/BMI 30-she has lost about 5 pounds just still think is absolutely fantastic. It's not quite as fast as she would like it to be but I still think that this is in the right direction and if she can lose another 5 pounds over the next 3-4 months that would be wonderful. I did strongly encourage her to start some type of exercise program. I know she has back pain but even a 10 minute walking program would be a great start. Also let her know that regular walking has been shown to improve back pain scores in patients who have chronic back pain. Next  Right sided thoracic back pain-most likely originating from the spine and she does have some known degenerative disc disease there. Again recommended physical therapy. Encouraged her to think about it a be happy to place referral if she is ready to do so.

## 2016-09-01 LAB — HEPATIC FUNCTION PANEL
ALBUMIN: 4.5 g/dL (ref 3.6–5.1)
ALK PHOS: 61 U/L (ref 33–115)
ALT: 42 U/L — AB (ref 6–29)
AST: 30 U/L (ref 10–35)
BILIRUBIN INDIRECT: 0.6 mg/dL (ref 0.2–1.2)
Bilirubin, Direct: 0.1 mg/dL (ref <=0.2)
TOTAL PROTEIN: 7.5 g/dL (ref 6.1–8.1)
Total Bilirubin: 0.7 mg/dL (ref 0.2–1.2)

## 2016-09-05 ENCOUNTER — Other Ambulatory Visit: Payer: Self-pay | Admitting: General Surgery

## 2016-09-05 DIAGNOSIS — D1721 Benign lipomatous neoplasm of skin and subcutaneous tissue of right arm: Secondary | ICD-10-CM | POA: Diagnosis not present

## 2016-12-30 ENCOUNTER — Ambulatory Visit (INDEPENDENT_AMBULATORY_CARE_PROVIDER_SITE_OTHER): Payer: BLUE CROSS/BLUE SHIELD | Admitting: Family Medicine

## 2016-12-30 ENCOUNTER — Encounter: Payer: Self-pay | Admitting: Family Medicine

## 2016-12-30 VITALS — BP 126/78 | HR 79 | Temp 98.1°F | Wt 187.0 lb

## 2016-12-30 DIAGNOSIS — J069 Acute upper respiratory infection, unspecified: Secondary | ICD-10-CM

## 2016-12-30 DIAGNOSIS — B9789 Other viral agents as the cause of diseases classified elsewhere: Secondary | ICD-10-CM

## 2016-12-30 DIAGNOSIS — R42 Dizziness and giddiness: Secondary | ICD-10-CM

## 2016-12-30 MED ORDER — BENZONATATE 200 MG PO CAPS: 200.0000 mg | ORAL_CAPSULE | Freq: Three times a day (TID) | ORAL | 0 refills | Status: DC | PRN

## 2016-12-30 MED ORDER — MECLIZINE HCL 25 MG PO TABS: 25.0000 mg | ORAL_TABLET | Freq: Three times a day (TID) | ORAL | 3 refills | Status: DC | PRN

## 2016-12-30 MED ORDER — IPRATROPIUM BROMIDE 0.06 % NA SOLN: 2.0000 | NASAL | 6 refills | Status: DC | PRN

## 2016-12-30 NOTE — Patient Instructions (Signed)
Thank you for coming in today. Call or go to the emergency room if you get worse, have trouble breathing, have chest pains, or palpitations.   Use the nasal spray and cough medicines as needed.  Take up to 3 ibuprofen (600mg ) every 8 hours as needed for pain and headache.  Take meclizine up to 3x daily for dizzy. This medicine may make you sleepy so mostly take it at night.   Call or go to the emergency room if you get worse, have trouble breathing, have chest pains, or palpitations.    Upper Respiratory Infection, Adult Most upper respiratory infections (URIs) are a viral infection of the air passages leading to the lungs. A URI affects the nose, throat, and upper air passages. The most common type of URI is nasopharyngitis and is typically referred to as "the common cold." URIs run their course and usually go away on their own. Most of the time, a URI does not require medical attention, but sometimes a bacterial infection in the upper airways can follow a viral infection. This is called a secondary infection. Sinus and middle ear infections are common types of secondary upper respiratory infections. Bacterial pneumonia can also complicate a URI. A URI can worsen asthma and chronic obstructive pulmonary disease (COPD). Sometimes, these complications can require emergency medical care and may be life threatening. What are the causes? Almost all URIs are caused by viruses. A virus is a type of germ and can spread from one person to another. What increases the risk? You may be at risk for a URI if:  You smoke.  You have chronic heart or lung disease.  You have a weakened defense (immune) system.  You are very young or very old.  You have nasal allergies or asthma.  You work in crowded or poorly ventilated areas.  You work in health care facilities or schools. What are the signs or symptoms? Symptoms typically develop 2-3 days after you come in contact with a cold virus. Most viral URIs  last 7-10 days. However, viral URIs from the influenza virus (flu virus) can last 14-18 days and are typically more severe. Symptoms may include:  Runny or stuffy (congested) nose.  Sneezing.  Cough.  Sore throat.  Headache.  Fatigue.  Fever.  Loss of appetite.  Pain in your forehead, behind your eyes, and over your cheekbones (sinus pain).  Muscle aches. How is this diagnosed? Your health care provider may diagnose a URI by:  Physical exam.  Tests to check that your symptoms are not due to another condition such as:  Strep throat.  Sinusitis.  Pneumonia.  Asthma. How is this treated? A URI goes away on its own with time. It cannot be cured with medicines, but medicines may be prescribed or recommended to relieve symptoms. Medicines may help:  Reduce your fever.  Reduce your cough.  Relieve nasal congestion. Follow these instructions at home:  Take medicines only as directed by your health care provider.  Gargle warm saltwater or take cough drops to comfort your throat as directed by your health care provider.  Use a warm mist humidifier or inhale steam from a shower to increase air moisture. This may make it easier to breathe.  Drink enough fluid to keep your urine clear or pale yellow.  Eat soups and other clear broths and maintain good nutrition.  Rest as needed.  Return to work when your temperature has returned to normal or as your health care provider advises. You may need  to stay home longer to avoid infecting others. You can also use a face mask and careful hand washing to prevent spread of the virus.  Increase the usage of your inhaler if you have asthma.  Do not use any tobacco products, including cigarettes, chewing tobacco, or electronic cigarettes. If you need help quitting, ask your health care provider. How is this prevented? The best way to protect yourself from getting a cold is to practice good hygiene.  Avoid oral or hand contact  with people with cold symptoms.  Wash your hands often if contact occurs. There is no clear evidence that vitamin C, vitamin E, echinacea, or exercise reduces the chance of developing a cold. However, it is always recommended to get plenty of rest, exercise, and practice good nutrition. Contact a health care provider if:  You are getting worse rather than better.  Your symptoms are not controlled by medicine.  You have chills.  You have worsening shortness of breath.  You have brown or red mucus.  You have yellow or brown nasal discharge.  You have pain in your face, especially when you bend forward.  You have a fever.  You have swollen neck glands.  You have pain while swallowing.  You have white areas in the back of your throat. Get help right away if:  You have severe or persistent:  Headache.  Ear pain.  Sinus pain.  Chest pain.  You have chronic lung disease and any of the following:  Wheezing.  Prolonged cough.  Coughing up blood.  A change in your usual mucus.  You have a stiff neck.  You have changes in your:  Vision.  Hearing.  Thinking.  Mood. This information is not intended to replace advice given to you by your health care provider. Make sure you discuss any questions you have with your health care provider. Document Released: 05/10/2001 Document Revised: 07/17/2016 Document Reviewed: 02/19/2014 Elsevier Interactive Patient Education  2017 [employer]   Dizziness Dizziness is a common problem. It is a feeling of unsteadiness or light-headedness. You may feel like you are about to faint. Dizziness can lead to injury if you stumble or fall. Anyone can become dizzy, but dizziness is more common in older adults. This condition can be caused by a number of things, including medicines, dehydration, or illness. Follow these instructions at home: Taking these steps may help with your condition: Eating and drinking  Drink enough fluid to  keep your urine clear or pale yellow. This helps to keep you from becoming dehydrated. Try to drink more clear fluids, such as water.  Do not drink alcohol.  Limit your caffeine intake if directed by your health care provider.  Limit your salt intake if directed by your health care provider. Activity  Avoid making quick movements.  Rise slowly from chairs and steady yourself until you feel okay.  In the morning, first sit up on the side of the bed. When you feel okay, stand slowly while you hold onto something until you know that your balance is fine.  Move your legs often if you need to stand in one place for a long time. Tighten and relax your muscles in your legs while you are standing.  Do not drive or operate heavy machinery if you feel dizzy.  Avoid bending down if you feel dizzy. Place items in your home so that they are easy for you to reach without leaning over. Lifestyle  Do not use any tobacco products, including cigarettes,  chewing tobacco, or electronic cigarettes. If you need help quitting, ask your health care provider.  Try to reduce your stress level, such as with yoga or meditation. Talk with your health care provider if you need help. General instructions  Watch your dizziness for any changes.  Take medicines only as directed by your health care provider. Talk with your health care provider if you think that your dizziness is caused by a medicine that you are taking.  Tell a friend or a family member that you are feeling dizzy. If he or she notices any changes in your behavior, have this person call your health care provider.  Keep all follow-up visits as directed by your health care provider. This is important. Contact a health care provider if:  Your dizziness does not go away.  Your dizziness or light-headedness gets worse.  You feel nauseous.  You have reduced hearing.  You have new symptoms.  You are unsteady on your feet or you feel like the room  is spinning. Get help right away if:  You vomit or have diarrhea and are unable to eat or drink anything.  You have problems talking, walking, swallowing, or using your arms, hands, or legs.  You feel generally weak.  You are not thinking clearly or you have trouble forming sentences. It may take a friend or family member to notice this.  You have chest pain, abdominal pain, shortness of breath, or sweating.  Your vision changes.  You notice any bleeding.  You have a headache.  You have neck pain or a stiff neck.  You have a fever. This information is not intended to replace advice given to you by your health care provider. Make sure you discuss any questions you have with your health care provider. Document Released: 05/10/2001 Document Revised: 04/21/2016 Document Reviewed: 11/10/2014 Elsevier Interactive Patient Education  2017 [employer].

## 2016-12-30 NOTE — Progress Notes (Signed)
Laura Johnston is a 46 y.o. female who presents to [facility] [place] today for sore throat, cough, congestion, headache runny nose and dizziness. Patient denies any vomiting but does note some diarrhea. She denies significant abdominal pain or blood in the stool. She notes her child recently tested positive for strep throat. Her symptoms have been present now for 3 days. She notes the dizziness is room spinning and consistent with previous episodes of dizziness. She denies lightheadedness or syncope. She notes the dizziness lasts for a few minutes in the evenings typically.   Past Medical History:  Diagnosis Date  . Abnormal Pap smear   . AMA (advanced maternal age) multigravida 18+   . GERD (gastroesophageal reflux disease)   . Gestational diabetes   . Vaginal delivery 08/19/2011   Past Surgical History:  Procedure Laterality Date  . APPENDECTOMY    . KNEE SURGERY    . LEEP    . TUMOR REMOVAL     from buttocks   Social History  Substance Use Topics  . Smoking status: Former Research scientist (life sciences)  . Smokeless tobacco: Never Used  . Alcohol use No   family history includes Diabetes in her maternal uncle; Heart disease in her father; Hyperlipidemia in her father; Hypertension in her father; Stroke in her father and paternal grandmother.  ROS as above:  Medications: Current Outpatient Prescriptions  Medication Sig Dispense Refill  . benzonatate (TESSALON) 200 MG capsule Take 1 capsule (200 mg total) by mouth 3 (three) times daily as needed for cough. 45 capsule 0  . celecoxib (CELEBREX) 200 MG capsule Take 1 capsule (200 mg total) by mouth 2 (two) times daily as needed. 60 capsule 0  . cyclobenzaprine (FLEXERIL) 10 MG tablet Take 1 tablet (10 mg total) by mouth 2 (two) times daily as needed for muscle spasms. 30 tablet 0  . ipratropium (ATROVENT) 0.06 % nasal spray Place 2  sprays into both nostrils every 4 (four) hours as needed for rhinitis. 10 mL 6  . meclizine (ANTIVERT) 25 MG tablet Take 1 tablet (25 mg total) by mouth 3 (three) times daily as needed for dizziness or nausea. 30 tablet 3  . naproxen (NAPROSYN) 500 MG tablet Take 1 tablet (500 mg total) by mouth 2 (two) times daily with a meal. 60 tablet 1  . tiZANidine (ZANAFLEX) 4 MG capsule Take 1 capsule (4 mg total) by mouth 2 (two) times daily as needed for muscle spasms. 30 capsule 0   No current facility-administered medications for this visit.    Facility-Administered Medications Ordered in Other Visits  Medication Dose Route Frequency Provider Last Rate Last Dose  . lactated ringers infusion   Intravenous Continuous Josephine Igo, MD 125 mL/hr at 08/21/11 1235     Allergies  Allergen Reactions  . Hydrocortisone   . Meloxicam     Paralysis  . Prednisone     Bilateral legs swollen.  . Pseudoephedrine     REACTION: numbness to fingers/tongue    Health Maintenance Health Maintenance  Topic Date Due  . INFLUENZA VACCINE  02/18/2017 (Originally 06/28/2016)  . PAP SMEAR  03/17/2018  . TETANUS/TDAP  08/19/2021  . HIV Screening  Completed     Exam:  BP 126/78   Pulse 79   Temp 98.1 F (36.7 C) (Oral)   Wt 187 lb (84.8 kg)   LMP 10/25/2011   SpO2 100%   BMI 32.10 kg/m  Gen: Well NAD HEENT: EOMI,  MMM Clear nasal discharge. Posterior pharynx with cobblestoning. No tonsillar erythema or exudate present. Minimal cervical lymphadenopathy present. Normal tympanic membranes bilaterally. Lungs: Normal work of breathing. CTABL Heart: RRR no MRG Abd: NABS, Soft. Nondistended, Nontender Exts: Brisk capillary refill, warm and well perfused.  Neuro: Alert and oriented normal coordination balance gait. Pupils are equal round reactive to light. Normal cranial nerve exam. Normal speech and thought process.  Point of care rapid strep test is negative. No results found for this or any previous visit  (from the past 72 hour(s)). No results found.    Assessment and Plan: 46 y.o. female with  Viral URI with vertigo. Strep throat is negative.  Plan to treat empirically symptomatically with Atrovent nasal spray ibuprofen Tessalon Perles. Additionally we'll use meclizine for vertigo. The neurologic exam is normal. I'm not sure if the vertigo is BPPV or possibly mild labyrinthitis. She appears well and not vertiginous now. Plan for a bit of watchful waiting. Follow-up with PCP as needed.   No orders of the defined types were placed in this encounter.   Discussed warning signs or symptoms. Please see discharge instructions. Patient expresses understanding.

## 2017-01-06 ENCOUNTER — Emergency Department (INDEPENDENT_AMBULATORY_CARE_PROVIDER_SITE_OTHER): Payer: BLUE CROSS/BLUE SHIELD

## 2017-01-06 ENCOUNTER — Emergency Department
Admission: EM | Admit: 2017-01-06 | Discharge: 2017-01-06 | Disposition: A | Discharge status: Home / Self Care | Payer: BLUE CROSS/BLUE SHIELD | Source: Home / Self Care | Attending: Family Medicine | Admitting: Family Medicine

## 2017-01-06 ENCOUNTER — Ambulatory Visit (HOSPITAL_BASED_OUTPATIENT_CLINIC_OR_DEPARTMENT_OTHER)
Admission: RE | Admit: 2017-01-06 | Discharge: 2017-01-06 | Disposition: A | Discharge status: Home / Self Care | Payer: BLUE CROSS/BLUE SHIELD | Source: Ambulatory Visit | Attending: Family Medicine | Admitting: Family Medicine

## 2017-01-06 ENCOUNTER — Encounter: Payer: Self-pay | Admitting: *Deleted

## 2017-01-06 DIAGNOSIS — M79661 Pain in right lower leg: Secondary | ICD-10-CM | POA: Insufficient documentation

## 2017-01-06 DIAGNOSIS — M25561 Pain in right knee: Secondary | ICD-10-CM

## 2017-01-06 MED ORDER — CYCLOBENZAPRINE HCL 10 MG PO TABS: 10.0000 mg | ORAL_TABLET | Freq: Three times a day (TID) | ORAL | 0 refills | Status: DC | PRN

## 2017-01-06 MED ORDER — DICLOFENAC POTASSIUM 25 MG PO CAPS: 1.0000 | ORAL_CAPSULE | Freq: Three times a day (TID) | ORAL | 0 refills | Status: DC | PRN

## 2017-01-06 NOTE — ED Triage Notes (Signed)
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 Pain is much worse with bending and walking. C/o numbness ini her toes and burning up to her right glute. Injured this knee 1 year ago.

## 2017-01-06 NOTE — Discharge Instructions (Signed)
°  Please go to [facility] for your ultrasound today.  If you arrive after 5PM you will need to check in through the emergency department but you will still be considered an "out patient"  Be sure to mention you have an order for an ultrasound from [facility].  You may have them call us if they have any questions.    Please wait in the waiting room until results come back so I can discuss with you over the phone before you leave.

## 2017-01-06 NOTE — ED Provider Notes (Signed)
CSN: [id number]     Arrival date & time 01/06/17  1548 History   First MD Initiated Contact with Patient 01/06/17 1613     Chief Complaint  Patient presents with  . Knee Pain   (Consider location/radiation/quality/duration/timing/severity/associated sxs/prior Treatment) HPI Laura Johnston is a 46 y.o. female presenting to UC with c/o 1 week of Right posterior-lateral knee pain that started last week while standing in the kitchen for a long period of time. Pain worsened last night. Worse with bending and walking. Intermittent numbness and tingling with burning in her Right buttock and toes.  No recent injury but she did fall onto her Right knee 1 year ago. she reports having a large bruise on her leg and was given crutches with diclofenac. Symptoms eventually resolved at that time.  She tried diclofenac the other day but realized it expired January 2018 so she has not taken anymore.     Past Medical History:  Diagnosis Date  . Abnormal Pap smear   . AMA (advanced maternal age) multigravida 72+   . GERD (gastroesophageal reflux disease)   . Gestational diabetes   . Vaginal delivery 08/19/2011   Past Surgical History:  Procedure Laterality Date  . APPENDECTOMY    . KNEE SURGERY    . LEEP    . TUMOR REMOVAL     from buttocks   Family History  Problem Relation Age of Onset  . Stroke Father   . Hypertension Father   . Hyperlipidemia Father   . Heart disease Father   . Diabetes Maternal Uncle   . Stroke Paternal Grandmother    Social History  Substance Use Topics  . Smoking status: Former Research scientist (life sciences)  . Smokeless tobacco: Never Used  . Alcohol use No   OB History    Gravida Para Term Preterm AB Living   2 1 1  0 1 1   SAB TAB Ectopic Multiple Live Births   1 0 0 0 1     Review of Systems  Musculoskeletal: Positive for arthralgias, gait problem and myalgias. Negative for back pain and joint swelling.       Right knee and leg pain  Skin: Negative for color change and wound.   Neurological: Negative for weakness and numbness.    Allergies  Hydrocortisone; Meloxicam; Prednisone; and Pseudoephedrine  Home Medications   Prior to Admission medications   Medication Sig Start Date End Date Taking? Authorizing Provider  benzonatate (TESSALON) 200 MG capsule Take 1 capsule (200 mg total) by mouth 3 (three) times daily as needed for cough. 12/30/16   Gregor Hams, MD  cyclobenzaprine (FLEXERIL) 10 MG tablet Take 1 tablet (10 mg total) by mouth 3 (three) times daily as needed for muscle spasms. 01/06/17   Noland Fordyce, PA-C  Diclofenac Potassium 25 MG CAPS Take 1 capsule (25 mg total) by mouth 3 (three) times daily as needed. 01/06/17   Noland Fordyce, PA-C  ipratropium (ATROVENT) 0.06 % nasal spray Place 2 sprays into both nostrils every 4 (four) hours as needed for rhinitis. 12/30/16   Gregor Hams, MD  meclizine (ANTIVERT) 25 MG tablet Take 1 tablet (25 mg total) by mouth 3 (three) times daily as needed for dizziness or nausea. 12/30/16   Gregor Hams, MD  naproxen (NAPROSYN) 500 MG tablet Take 1 tablet (500 mg total) by mouth 2 (two) times daily with a meal. 02/19/16   Hali Marry, MD  tiZANidine (ZANAFLEX) 4 MG capsule Take 1 capsule (4 mg total) by  mouth 2 (two) times daily as needed for muscle spasms. 03/09/16   Hali Marry, MD   Meds Ordered and Administered this Visit  Medications - No data to display  BP 125/84 (BP Location: Left Arm)   Pulse 98   Wt 187 lb (84.8 kg)   LMP 10/25/2011   SpO2 97%   BMI 32.10 kg/m  No data found.   Physical Exam  Constitutional: She is oriented to person, place, and time. She appears well-developed and well-nourished.  HENT:  Head: Normocephalic and atraumatic.  Eyes: EOM are normal.  Neck: Normal range of motion.  Cardiovascular: Normal rate.   Pulmonary/Chest: Effort normal.  Musculoskeletal: She exhibits tenderness. She exhibits no edema.  Right knee and calf: no obvious edema. Tenderness to posterior and  lateral aspect of knee and lower leg. Calf is soft. Limited knee flexion due to pain.  Neurological: She is alert and oriented to person, place, and time.  Antalgic gait.  Skin: Skin is warm and dry. Capillary refill takes less than 2 seconds.  Right knee and calf: skin in tact. No ecchymosis or erythema.  Psychiatric: She has a normal mood and affect. Her behavior is normal.  Nursing note and vitals reviewed.   Urgent Care Course     Procedures (including critical care time)  Labs Review Labs Reviewed - No data to display  Imaging Review US Venous Img Lower Unilateral Right  Result Date: 01/06/2017 CLINICAL DATA:  RIGHT knee pain for 1 week. EXAM: RIGHT LOWER EXTREMITY VENOUS DOPPLER ULTRASOUND TECHNIQUE: Gray-scale sonography with graded compression, as well as color Doppler and duplex ultrasound were performed to evaluate the lower extremity deep venous systems from the level of the common femoral vein and including the common femoral, femoral, profunda femoral, popliteal and calf veins including the posterior tibial, peroneal and gastrocnemius veins when visible. The superficial great saphenous vein was also interrogated. Spectral Doppler was utilized to evaluate flow at rest and with distal augmentation maneuvers in the common femoral, femoral and popliteal veins. COMPARISON:  None. FINDINGS: Contralateral Common Femoral Vein: Respiratory phasicity is normal and symmetric with the symptomatic side. No evidence of thrombus. Normal compressibility. Common Femoral Vein: No evidence of thrombus. Normal compressibility, respiratory phasicity and response to augmentation. Saphenofemoral Junction: No evidence of thrombus. Normal compressibility and flow on color Doppler imaging. Profunda Femoral Vein: No evidence of thrombus. Normal compressibility and flow on color Doppler imaging. Femoral Vein: No evidence of thrombus. Normal compressibility, respiratory phasicity and response to augmentation.  Popliteal Vein: No evidence of thrombus. Normal compressibility, respiratory phasicity and response to augmentation. Calf Veins: No evidence of thrombus. Normal compressibility and flow on color Doppler imaging. Superficial Great Saphenous Vein: No evidence of thrombus. Normal compressibility and flow on color Doppler imaging. IMPRESSION: No evidence of deep venous thrombosis. Electronically Signed   By: Suzy Bouchard M.D.   On: 01/06/2017 18:37   Dg Knee Complete 4 Views Right  Result Date: 01/06/2017 CLINICAL DATA:  Knee pain, no known injury, initial encounter EXAM: RIGHT KNEE - COMPLETE 4+ VIEW COMPARISON:  03/14/2016 FINDINGS: No acute fracture or dislocation is noted. There are multiple radiopaque foreign bodies identified projecting in the region of the infrapatellar tendon. These are stable from the prior exam. No new focal abnormality is seen. IMPRESSION: No acute abnormality noted. Electronically Signed   By: Inez Catalina M.D.   On: 01/06/2017 17:18     MDM   1. Posterior knee pain, right   2. Pain in  right lower leg    Pt c/o Right knee and lower leg pain w/o known injury.  Plain films: Negative for acute findings. U/S right lower leg: no evidence of DVT Discussed results with pt over the phone (U/S was performed at [facility]).  Reassured pt of no blood clots Encouraged rest, elevation, may use OTC knee sleeve. Rx: Diclofenac (has done well on it before) and flexeril Encouraged to call Dr. Gardiner Ramus office on Monday to schedule f/u appointment if not improving.     Noland Fordyce, PA-C 01/06/17 [phone]

## 2017-01-19 ENCOUNTER — Emergency Department (INDEPENDENT_AMBULATORY_CARE_PROVIDER_SITE_OTHER)
Admission: EM | Admit: 2017-01-19 | Discharge: 2017-01-19 | Disposition: A | Discharge status: Home / Self Care | Payer: BLUE CROSS/BLUE SHIELD | Source: Home / Self Care | Attending: Family Medicine | Admitting: Family Medicine

## 2017-01-19 ENCOUNTER — Telehealth: Payer: Self-pay

## 2017-01-19 ENCOUNTER — Encounter: Payer: Self-pay | Admitting: *Deleted

## 2017-01-19 DIAGNOSIS — J020 Streptococcal pharyngitis: Secondary | ICD-10-CM

## 2017-01-19 LAB — POCT RAPID STREP A (OFFICE): Rapid Strep A Screen: POSITIVE — AB

## 2017-01-19 MED ORDER — PENICILLIN V POTASSIUM 500 MG PO TABS: ORAL_TABLET | ORAL | 0 refills | Status: DC

## 2017-01-19 NOTE — Discharge Instructions (Signed)
Try warm salt water gargles for sore throat.  ?May take Ibuprofen 200mg, 4 tabs every 8 hours with food.  ?

## 2017-01-19 NOTE — ED Triage Notes (Signed)
Pt c/o sore throat, body aches, chills and HA x last night.

## 2017-01-19 NOTE — Telephone Encounter (Signed)
Encounter created to enter strep test order and result not entered on DOS.   

## 2017-01-19 NOTE — ED Provider Notes (Signed)
[name]    CSN: [id number] Arrival date & time: 01/19/17  1134     History   Chief Complaint Chief Complaint  Patient presents with  . Sore Throat    HPI Laura Johnston is a 46 y.o. female.   Last night patient developed fatigue, myalgias, sore throat, ?earache, and chills/sweats.  Minimal cough.   The history is provided by the patient.    Past Medical History:  Diagnosis Date  . Abnormal Pap smear   . AMA (advanced maternal age) multigravida 54+   . GERD (gastroesophageal reflux disease)   . Gestational diabetes   . Vaginal delivery 08/19/2011    Patient Active Problem List   Diagnosis Date Noted  . Fatty liver disease, nonalcoholic 0000000  . Vitamin D deficiency 07/20/2016  . HYPERTRIGLYCERIDEMIA 03/08/2010  . DIZZINESS 02/11/2010    Past Surgical History:  Procedure Laterality Date  . APPENDECTOMY    . KNEE SURGERY    . LEEP    . TUMOR REMOVAL     from buttocks    OB History    Gravida Para Term Preterm AB Living   2 1 1  0 1 1   SAB TAB Ectopic Multiple Live Births   1 0 0 0 1       Home Medications    Prior to Admission medications   Medication Sig Start Date End Date Taking? Authorizing Provider  Diclofenac Potassium 25 MG CAPS Take 1 capsule (25 mg total) by mouth 3 (three) times daily as needed. 01/06/17  Yes Noland Fordyce, PA-C  cyclobenzaprine (FLEXERIL) 10 MG tablet Take 1 tablet (10 mg total) by mouth 3 (three) times daily as needed for muscle spasms. 01/06/17   Noland Fordyce, PA-C  ipratropium (ATROVENT) 0.06 % nasal spray Place 2 sprays into both nostrils every 4 (four) hours as needed for rhinitis. 12/30/16   Gregor Hams, MD  meclizine (ANTIVERT) 25 MG tablet Take 1 tablet (25 mg total) by mouth 3 (three) times daily as needed for dizziness or nausea. 12/30/16   Gregor Hams, MD  naproxen (NAPROSYN) 500 MG tablet Take 1 tablet (500 mg total) by mouth 2 (two) times daily with a meal. 02/19/16   Hali Marry, MD   penicillin v potassium (VEETID) 500 MG tablet Take one tab by mouth twice daily for 10 days 01/19/17   Kandra Nicolas, MD  tiZANidine (ZANAFLEX) 4 MG capsule Take 1 capsule (4 mg total) by mouth 2 (two) times daily as needed for muscle spasms. 03/09/16   Hali Marry, MD    Family History Family History  Problem Relation Age of Onset  . Stroke Father   . Hypertension Father   . Hyperlipidemia Father   . Heart disease Father   . Diabetes Maternal Uncle   . Stroke Paternal Grandmother     Social History Social History  Substance Use Topics  . Smoking status: Former Research scientist (life sciences)  . Smokeless tobacco: Never Used  . Alcohol use No     Allergies   Hydrocortisone; Meloxicam; Prednisone; and Pseudoephedrine   Review of Systems Review of Systems + sore throat ? cough No pleuritic pain No wheezing No nasal congestion No post-nasal drainage No sinus pain/pressure No itchy/red eyes ? earache No hemoptysis No SOB ? fever, + chills/sweats No nausea No vomiting No abdominal pain No diarrhea No urinary symptoms No skin rash + fatigue + myalgias + headache Used OTC meds without relief   Physical Exam Triage Vital Signs  ED Triage Vitals  Enc Vitals Group     BP 01/19/17 1155 123/84     Pulse Rate 01/19/17 1155 119     Resp 01/19/17 1155 18     Temp 01/19/17 1155 99.1 F (37.3 C)     Temp Source 01/19/17 1155 Oral     SpO2 01/19/17 1155 100 %     Weight 01/19/17 1156 187 lb (84.8 kg)     Height --      Head Circumference --      Peak Flow --      Pain Score 01/19/17 1157 0     Pain Loc --      Pain Edu? --      Excl. in [place]? --    No data found.   Updated Vital Signs BP 123/84 (BP Location: Left Arm)   Pulse 119   Temp 99.1 F (37.3 C) (Oral)   Resp 18   Wt 187 lb (84.8 kg)   LMP 10/25/2011   SpO2 100%   BMI 32.10 kg/m   Visual Acuity Right Eye Distance:   Left Eye Distance:   Bilateral Distance:    Right Eye Near:   Left Eye Near:      Bilateral Near:     Physical Exam Nursing notes and Vital Signs reviewed. Appearance:  Patient appears stated age, and in no acute distress Eyes:  Pupils are equal, round, and reactive to light and accomodation.  Extraocular movement is intact.  Conjunctivae are not inflamed  Ears:  Canals normal.  Tympanic membranes normal.  Nose:  Mildly congested turbinates.  No sinus tenderness.  Pharynx:  Erythematous and slightly swollen without obstruction.  Neck:  Supple.  Tender enlarged tonsillar nodes bilaterally. Lungs:  Clear to auscultation.  Breath sounds are equal.  Moving air well. Heart:  Regular rate and rhythm without murmurs, rubs, or gallops.  Abdomen:  Nontender without masses or hepatosplenomegaly.  Bowel sounds are present.  No CVA or flank tenderness.  Extremities:  No edema.  Skin:  No rash present.    UC Treatments / Results  Labs (all labs ordered are listed, but only abnormal results are displayed) Labs Reviewed  POCT RAPID STREP A (OFFICE) positive    EKG  EKG Interpretation None       Radiology No results found.  Procedures Procedures (including critical care time)  Medications Ordered in UC Medications - No data to display   Initial Impression / Assessment and Plan / UC Course  I have reviewed the triage vital signs and the nursing notes.  Pertinent labs & imaging results that were available during my care of the patient were reviewed by me and considered in my medical decision making (see chart for details).    Begin Pen VK Try warm salt water gargles for sore throat.  May take Ibuprofen 200mg , 4 tabs every 8 hours with food.  Followup with Family Doctor if not improved in one week.     Final Clinical Impressions(s) / UC Diagnoses   Final diagnoses:  Strep pharyngitis    New Prescriptions New Prescriptions   PENICILLIN V POTASSIUM (VEETID) 500 MG TABLET    Take one tab by mouth twice daily for 10 days     Kandra Nicolas, MD 01/19/17  1241

## 2017-02-06 ENCOUNTER — Ambulatory Visit (INDEPENDENT_AMBULATORY_CARE_PROVIDER_SITE_OTHER): Payer: BLUE CROSS/BLUE SHIELD | Admitting: Family Medicine

## 2017-02-06 ENCOUNTER — Encounter: Payer: Self-pay | Admitting: Family Medicine

## 2017-02-06 ENCOUNTER — Ambulatory Visit: Payer: BLUE CROSS/BLUE SHIELD | Admitting: Family Medicine

## 2017-02-06 VITALS — BP 113/73 | HR 119 | Temp 98.1°F | Ht 64.0 in | Wt 184.0 lb

## 2017-02-06 DIAGNOSIS — J0100 Acute maxillary sinusitis, unspecified: Secondary | ICD-10-CM | POA: Diagnosis not present

## 2017-02-06 MED ORDER — AZITHROMYCIN 250 MG PO TABS: ORAL_TABLET | ORAL | 0 refills | Status: AC

## 2017-02-06 NOTE — Progress Notes (Signed)
   Subjective:    Patient ID: Laura Johnston, female    DOB: 02-Nov-1971, 46 y.o.   MRN: [id number]  HPI Cough with green productive sputum x 4 days.  Has been taking some OTC medications.  No fever but has been sweating.  She's also had runny nose as well as some nasal congestion. No GI symptoms. She has pain over her facial cheeks bilaterally.  Had strep throat about 4 weeks ago, and was treated with penicillin..     Review of Systems     Objective:   Physical Exam  Constitutional: She is oriented to person, place, and time. She appears well-developed and well-nourished.  HENT:  Head: Normocephalic and atraumatic.  Right Ear: External ear normal.  Left Ear: External ear normal.  Nose: Nose normal.  Mouth/Throat: Oropharynx is clear and moist.  TMs and canals are clear.   Eyes: Conjunctivae and EOM are normal. Pupils are equal, round, and reactive to light.  Neck: Neck supple. No thyromegaly present.  Cardiovascular: Normal rate, regular rhythm and normal heart sounds.   Pulmonary/Chest: Effort normal and breath sounds normal. She has no wheezes.  Lymphadenopathy:    She has no cervical adenopathy.  Neurological: She is alert and oriented to person, place, and time.  Skin: Skin is warm and dry.  Psychiatric: She has a normal mood and affect.          Assessment & Plan:  Acute bronchitis and sinusitis - Treat with azithromycin. Recommend symptomatic care. Call if not significantly better by the end of the week. All 6 planes this could still be viral.

## 2017-02-06 NOTE — Addendum Note (Signed)
Addended by: Beatrice Lecher D on: 02/06/2017 01:35 PM   Modules accepted: Orders

## 2017-02-06 NOTE — Patient Instructions (Addendum)
Upper Respiratory Infection, Adult Most upper respiratory infections (URIs) are caused by a virus. A URI affects the nose, throat, and upper air passages. The most common type of URI is often called "the common cold." Follow these instructions at home:  Take medicines only as told by your doctor.  Gargle warm saltwater or take cough drops to comfort your throat as told by your doctor.  Use a warm mist humidifier or inhale steam from a shower to increase air moisture. This may make it easier to breathe.  Drink enough fluid to keep your pee (urine) clear or pale yellow.  Eat soups and other clear broths.  Have a healthy diet.  Rest as needed.  Go back to work when your fever is gone or your doctor says it is okay.  You may need to stay home longer to avoid giving your URI to others.  You can also wear a face mask and wash your hands often to prevent spread of the virus.  Use your inhaler more if you have asthma.  Do not use any tobacco products, including cigarettes, chewing tobacco, or electronic cigarettes. If you need help quitting, ask your doctor. Contact a doctor if:  You are getting worse, not better.  Your symptoms are not helped by medicine.  You have chills.  You are getting more short of breath.  You have brown or red mucus.  You have yellow or brown discharge from your nose.  You have pain in your face, especially when you bend forward.  You have a fever.  You have puffy (swollen) neck glands.  You have pain while swallowing.  You have white areas in the back of your throat. Get help right away if:  You have very bad or constant:  Headache.  Ear pain.  Pain in your forehead, behind your eyes, and over your cheekbones (sinus pain).  Chest pain.  You have long-lasting (chronic) lung disease and any of the following:  Wheezing.  Long-lasting cough.  Coughing up blood.  A change in your usual mucus.  You have a stiff neck.  You have  changes in your:  Vision.  Hearing.  Thinking.  Mood. This information is not intended to replace advice given to you by your health care provider. Make sure you discuss any questions you have with your health care provider. Document Released: 05/02/2008 Document Revised: 07/17/2016 Document Reviewed: 02/19/2014 Elsevier Interactive Patient Education  2017 Elsevier Inc.  

## 2017-03-02 ENCOUNTER — Encounter: Payer: Self-pay | Admitting: Sports Medicine

## 2017-03-02 ENCOUNTER — Ambulatory Visit (INDEPENDENT_AMBULATORY_CARE_PROVIDER_SITE_OTHER): Payer: BLUE CROSS/BLUE SHIELD | Admitting: Sports Medicine

## 2017-03-02 DIAGNOSIS — M25561 Pain in right knee: Secondary | ICD-10-CM | POA: Diagnosis not present

## 2017-03-02 DIAGNOSIS — M170 Bilateral primary osteoarthritis of knee: Secondary | ICD-10-CM | POA: Insufficient documentation

## 2017-03-02 DIAGNOSIS — M1711 Unilateral primary osteoarthritis, right knee: Secondary | ICD-10-CM | POA: Insufficient documentation

## 2017-03-02 MED ORDER — DICLOFENAC SODIUM 75 MG PO TBEC: 75.0000 mg | DELAYED_RELEASE_TABLET | Freq: Two times a day (BID) | ORAL | 3 refills | Status: DC

## 2017-03-02 NOTE — Progress Notes (Signed)
   Subjective:    I'm seeing this patient as a consultation for:  Dr. Beatrice Lecher  CC: Right knee swelling  HPI: This is a pleasant 46 year old female, for the past several weeks she's had increasing pain and swelling in her right knee, with inability to flex past 90, she recalls trying to get out of a seated position sometime ago which resulted in a pop bleeding to the swelling and pain. Pain is at the joint line as well as anteriorly, no constitutional symptoms. She does have a history of a patellar tendon rupture with open reduction and internal fixation, unfortunately some of the wires were left in. These remnants were visible on x-ray.  Past medical history:  Negative.  See flowsheet/record as well for more information.  Surgical history: Negative.  See flowsheet/record as well for more information.  Family history: Negative.  See flowsheet/record as well for more information.  Social history: Negative.  See flowsheet/record as well for more information.  Allergies, and medications have been entered into the medical record, reviewed, and no changes needed.   Review of Systems: No headache, visual changes, nausea, vomiting, diarrhea, constipation, dizziness, abdominal pain, skin rash, fevers, chills, night sweats, weight loss, swollen lymph nodes, body aches, joint swelling, muscle aches, chest pain, shortness of breath, mood changes, visual or auditory hallucinations.   Objective:   General: Well Developed, well nourished, and in no acute distress.  Neuro/Psych: Alert and oriented x3, extra-ocular muscles intact, able to move all 4 extremities, sensation grossly intact. Skin: Warm and dry, no rashes noted.  Respiratory: Not using accessory muscles, speaking in full sentences, trachea midline.  Cardiovascular: Pulses palpable, no extremity edema. Abdomen: Does not appear distended. Right Knee: Visible swollen with effusion, palpable fluid wave, tenderness at the medial joint  line ROM normal in flexion and extension and lower leg rotation. Ligaments with solid consistent endpoints including ACL, PCL, LCL, MCL. Negative Mcmurray's and provocative meniscal tests. Non painful patellar compression. Patellar and quadriceps tendons unremarkable. Hamstring and quadriceps strength is normal.  Procedure: Real-time Ultrasound Guided aspiration/injection of right knee Device: GE Logiq E  Verbal informed consent obtained.  Time-out conducted.  Noted no overlying erythema, induration, or other signs of local infection.  Skin prepped in a sterile fashion.  Local anesthesia: Topical Ethyl chloride.  With sterile technique and under real time ultrasound guidance:  Aspirated 30 mL straw-colored fluid, syringe switched and 1 mL Kenalog 40, 2 mL lidocaine, 2 mL bupivacaine injected easily. Completed without difficulty  Pain immediately resolved suggesting accurate placement of the medication.  Advised to call if fevers/chills, erythema, induration, drainage, or persistent bleeding.  Images permanently stored and available for review in the ultrasound unit.  Impression: Technically successful ultrasound guided injection.  Impression and Recommendations:   This case required medical decision making of moderate complexity.  Right knee pain Massive effusion, aspiration and injection as above. Voltaren, physical therapy. She is post patellar tendon reconstruction unfortunately with some left over wire in the patellar tendon likely precluding use of an MRI. If persistent pain at the follow-up visit we will set her up for a CT arthrogram of the knee.

## 2017-03-02 NOTE — Assessment & Plan Note (Addendum)
Massive effusion, aspiration and injection as above. Voltaren, physical therapy. She is post patellar tendon reconstruction unfortunately with some left over wire in the patellar tendon likely precluding use of an MRI. Before the MRI we will discuss with radiology if it would be safe. If persistent pain at the follow-up visit and unable to do an MRI, we will set her up for a CT arthrogram of the knee.

## 2017-03-10 ENCOUNTER — Ambulatory Visit (INDEPENDENT_AMBULATORY_CARE_PROVIDER_SITE_OTHER): Payer: BLUE CROSS/BLUE SHIELD | Admitting: Physical Therapy

## 2017-03-10 DIAGNOSIS — M6281 Muscle weakness (generalized): Secondary | ICD-10-CM | POA: Diagnosis not present

## 2017-03-10 DIAGNOSIS — M25561 Pain in right knee: Secondary | ICD-10-CM

## 2017-03-10 NOTE — Therapy (Signed)
[place], [place], [address] Phone: [phone]   Fax:  [phone]  Physical Therapy Evaluation  Patient Details  Name: Laura Johnston MRN: [id number] Date of Birth: [birth year] Referring Provider: Silverio Decamp, MD  Encounter Date: 03/10/2017      PT End of Session - 03/10/17 0929    Visit Number 1   Number of Visits 12   Date for PT Re-Evaluation 04/21/17   Authorization Type BCBS-30 visit limit   PT Start Time 0840   PT Stop Time 0920   PT Time Calculation (min) 40 min   Activity Tolerance Patient tolerated treatment well   Behavior During Therapy [facility] for tasks assessed/performed      Past Medical History:  Diagnosis Date  . Abnormal Pap smear   . AMA (advanced maternal age) multigravida 37+   . GERD (gastroesophageal reflux disease)   . Gestational diabetes   . Vaginal delivery 08/19/2011    Past Surgical History:  Procedure Laterality Date  . APPENDECTOMY    . KNEE SURGERY    . LEEP    . TUMOR REMOVAL     from buttocks    There were no vitals filed for this visit.       Subjective Assessment - 03/10/17 0836    Subjective Pt is a 46 y/o female who presents to OPPT for Rt knee pain.  Pt reports symptoms began Feb 2017 when knee "popped loudly" but improved.  Pt then in Feb 2018 developed increasing knee pain and difficulty with stairs and squatting.  Pt reports loss of motion and pain, so went to MD  on 03/02/17.  MD drained fluid from Rt knee, which she reports has helped but she still has pain.   Pertinent History Rt knee patella tendon rupture s/p repair (1996)   Limitations Walking;Sitting;Standing   How long can you walk comfortably? 30 min   Patient Stated Goals improve pain, be comfortable walking   Currently in Pain? Yes   Pain Score 4   up to 7/10   Pain Orientation Right   Pain Descriptors / Indicators Burning;Stabbing   Pain Type Chronic pain   Pain  Onset More than a month ago   Pain Frequency Intermittent   Aggravating Factors  walking, squatting, stairs   Pain Relieving Factors rest, elevation            [facility] PT Assessment - 03/10/17 0843      Assessment   Medical Diagnosis Rt knee pain   Referring Provider Silverio Decamp, MD   Onset Date/Surgical Date --  Feb 2018   Next MD Visit 03/30/17   Prior Therapy following surgery, none since     Precautions   Precautions None     Restrictions   Weight Bearing Restrictions No     Balance Screen   Has the patient fallen in the past 6 months No   Has the patient had a decrease in activity level because of a fear of falling?  Yes   Is the patient reluctant to leave their home because of a fear of falling?  No     Home Environment   Living Environment Private residence   Living Arrangements Spouse/significant other;Children  44 y/o son   Type of [place] to enter   Entrance Stairs-Number of Steps 1   Entrance Stairs-Rails None   Home Layout Two level;Bed/bath upstairs   Alternate Level Stairs-Number of Steps  14   Alternate Level Stairs-Rails Left     Prior Function   Level of Independence Independent   Vocation Full time employment   Physicist, medical - sitting all day   Leisure spend time with family, [employer] 2-3 x/wk (no longer member but may rejoin) - unable to exercise since knee pain     Cognition   Overall Cognitive Status Within Functional Limits for tasks assessed     Observation/Other Assessments   Focus on Therapeutic Outcomes (FOTO)  37 (63% limited; predicted 43% limited)     AROM   Overall AROM Comments pain with end range flexion on Rt   AROM Assessment Site Knee   Right/Left Knee Right;Left   Right Knee Extension -2   Right Knee Flexion 125   Left Knee Extension -2   Left Knee Flexion 125     Strength   Strength Assessment Site Hip;Knee   Right/Left Hip Right;Left   Right Hip Flexion 4/5   Right  Hip Extension 3+/5   Right Hip External Rotation  3/5   Right Hip Internal Rotation 3+/5   Right Hip ABduction 4+/5   Right Hip ADduction 3+/5   Left Hip Flexion 4/5   Left Hip Extension 5/5   Left Hip External Rotation 4/5   Left Hip Internal Rotation 4/5   Left Hip ABduction 5/5   Left Hip ADduction 5/5   Right/Left Knee Right;Left   Right Knee Flexion 3/5   Right Knee Extension 5/5   Left Knee Flexion 5/5   Left Knee Extension 5/5     Flexibility   Soft Tissue Assessment /Muscle Length yes  tight gastrocs bil   Hamstrings mild tightness on Rt   Quadriceps tightness on Rt     Palpation   Palpation comment tenderness along pes anserine bursa     Ambulation/Gait   Gait Comments amb independent without significant deviations                   [facility] Adult PT Treatment/Exercise - 03/10/17 0843      Exercises   Exercises Knee/Hip     Knee/Hip Exercises: Stretches   Passive Hamstring Stretch Right;1 rep;30 seconds   Passive Hamstring Stretch Limitations supine with strap; for HEP instruction   Quad Stretch Right;1 rep;30 seconds   Quad Stretch Limitations prone with strap; for HEP instruction   Gastroc Stretch Right;1 rep;30 seconds   Gastroc Stretch Limitations for HEP instruction     Modalities   Modalities Iontophoresis     Iontophoresis   Type of Iontophoresis Dexamethasone   Location Rt medial knee   Dose 1.0 cc   Time 6 hour patch                PT Education - 03/10/17 0929    Education provided Yes   Education Details HEP, ionto   Person(s) Educated Patient   Methods Explanation;Demonstration;Handout   Comprehension Verbalized understanding;Returned demonstration             PT Long Term Goals - 03/10/17 0942      PT LONG TERM GOAL #1   Title independent with HEP (04/21/17)   Time 6   Period Weeks   Status New     PT LONG TERM GOAL #2   Title perform Rt knee ROM without pain for improved function (04/21/17)   Time 6    Period Weeks   Status New     PT LONG TERM GOAL #3  Title report ability to walk > 45 min without increase in pain for improved functional mobility (04/21/17)   Time 6   Period Weeks   Status New     PT LONG TERM GOAL #4   Title demonstrate at least 4/5 in RLE for improved function (04/21/17)   Time 6   Period Weeks   Status New               Plan - 03/10/17 0939    Clinical Impression Statement Pt is a 46 y/o female who presents to OPPT for low complexity PT eval for Rt knee pain.  Pt without ROM deficits, but pain with end range flexion.  Pt demonstrates RLE weakness, decreased flexibility and tenderness along pes anserine bursa.  No significant swelling noted today but recent aspiration of fluid, so will monitor for return of fluid.  Will benefit from PT to address deficits.   Rehab Potential Good   PT Frequency 2x / week   PT Duration 6 weeks   PT Treatment/Interventions ADLs/Self Care Home Management;Cryotherapy;Electrical Stimulation;Iontophoresis 4mg /ml Dexamethasone;Ultrasound;Moist Heat;Neuromuscular re-education;Therapeutic exercise;Functional mobility training;Therapeutic activities;Stair training;Gait training;Patient/family education;Manual techniques;Passive range of motion;Dry needling;Taping;Vasopneumatic Device   PT Next Visit Plan review stretches, add strengthening to HEP, assess response to ionto, modalities PRN   Consulted and Agree with Plan of Care Patient      Patient will benefit from skilled therapeutic intervention in order to improve the following deficits and impairments:  Difficulty walking, Decreased strength, Impaired flexibility, Pain, Increased edema  Visit Diagnosis: Acute pain of right knee - Plan: PT plan of care cert/re-cert  Muscle weakness (generalized) - Plan: PT plan of care cert/re-cert     Problem List Patient Active Problem List   Diagnosis Date Noted  . Right knee pain 03/02/2017  . Fatty liver disease, nonalcoholic  40/34/7425  . Vitamin D deficiency 07/20/2016  . HYPERTRIGLYCERIDEMIA 03/08/2010  . DIZZINESS 02/11/2010        Laureen Abrahams, PT, DPT 03/10/17 9:47 AM    [facility] [place], [place], [address] Phone: [phone]   Fax:  [phone]  Name: Laura Johnston MRN: [id number] Date of Birth: Jul 25, 1971

## 2017-03-10 NOTE — Patient Instructions (Signed)
KNEE: Quadriceps - Prone    Place strap around ankle. Bring ankle toward buttocks. Press hip into surface. Hold __20-30_ seconds. _2-3__ reps per set, _1__ sets per day, _6-7__ days per week   Copyright  VHI. All rights reserved.   Hamstring Step 2    Right foot relaxed, knee straight, other leg bent, foot flat. Raise straight leg further upward to maximal range. Hold _20-30__ seconds. Relax leg completely down. Repeat _2-3__ times.  Copyright  VHI. All rights reserved.    Achilles / Gastroc, Standing    Stand, right foot behind, heel on floor and turned slightly out, leg straight, forward leg bent. Move hips forward. Hold _20-30__ seconds. Repeat _2-3__ times per session. Do _1__ sessions per day.  Copyright  VHI. All rights reserved.

## 2017-03-13 ENCOUNTER — Ambulatory Visit (INDEPENDENT_AMBULATORY_CARE_PROVIDER_SITE_OTHER): Payer: BLUE CROSS/BLUE SHIELD | Admitting: Physical Therapy

## 2017-03-13 DIAGNOSIS — M6281 Muscle weakness (generalized): Secondary | ICD-10-CM | POA: Diagnosis not present

## 2017-03-13 DIAGNOSIS — M25561 Pain in right knee: Secondary | ICD-10-CM

## 2017-03-13 NOTE — Therapy (Signed)
[place], [place], [address] Phone: [phone]   Fax:  [phone]  Physical Therapy Treatment  Patient Details  Name: Laura Johnston MRN: [id number] Date of Birth: 04/10/1971 Referring Provider: Silverio Decamp, MD  Encounter Date: 03/13/2017      PT End of Session - 03/13/17 0845    Visit Number 2   Number of Visits 12   Date for PT Re-Evaluation 04/21/17   Authorization Type BCBS-30 visit limit   PT Start Time 0803   PT Stop Time 0845   PT Time Calculation (min) 42 min      Past Medical History:  Diagnosis Date  . Abnormal Pap smear   . AMA (advanced maternal age) multigravida 56+   . GERD (gastroesophageal reflux disease)   . Gestational diabetes   . Vaginal delivery 08/19/2011    Past Surgical History:  Procedure Laterality Date  . APPENDECTOMY    . KNEE SURGERY    . LEEP    . TUMOR REMOVAL     from buttocks    There were no vitals filed for this visit.      Subjective Assessment - 03/13/17 0807    Subjective Pt reports she was busy with chores this weekend, but was able to do stretches 2x/day. Knee is still painful. She was only able to tolerate ionto for 3 hrs, took off patch.  She had redness and tiny blisters in area.    Patient Stated Goals improve pain, be comfortable walking   Currently in Pain? Yes   Pain Score 6    Pain Location Knee   Pain Orientation Right   Pain Radiating Towards towards Rt ankle.    Aggravating Factors  stairs, squatting   Pain Relieving Factors medicine            [facility] PT Assessment - 03/13/17 0001      Flexibility   Quadriceps Rt knee 97 deg, Lt knee 127 deg.           [facility] Adult PT Treatment/Exercise - 03/13/17 0001      Knee/Hip Exercises: Stretches   Passive Hamstring Stretch Right;Left;2 reps   Passive Hamstring Stretch Limitations supine with strap; 1 additional rep in sitting   Quad Stretch Right;Left;3  reps;30 seconds   Quad Stretch Limitations prone with strap; 1 rep in sitting foot slightly under chair, leaning back - to tolerance    Hip Flexor Stretch Right;1 rep;20 seconds  seated with leg off chair, foot back   Gastroc Stretch Right;Left;30 seconds;2 reps   Other Knee/Hip Stretches ITB and adductor stretch x 30 sec in supine x 2 reps each on RLE.      Knee/Hip Exercises: Aerobic   Nustep L4: 5.5 min      Knee/Hip Exercises: Seated   Long Arc Quad Right;1 set;5 reps  5 sec hold     Manual Therapy   Manual Therapy Taping   Manual therapy comments I strip of I tape place on Rt medial knee at joint line, and I strip placed over Rt post fibular head, 15% stretch to decompress tissue, provide support, increase proprioception.             PT Long Term Goals - 03/13/17 0900      PT LONG TERM GOAL #1   Title independent with HEP (04/21/17)   Time 6   Period Weeks   Status On-going     PT LONG TERM GOAL #2  Title perform Rt knee ROM without pain for improved function (04/21/17)   Time 6   Period Weeks   Status On-going     PT LONG TERM GOAL #3   Title report ability to walk > 45 min without increase in pain for improved functional mobility (04/21/17)   Time 6   Period Weeks   Status On-going     PT LONG TERM GOAL #4   Title demonstrate at least 4/5 in RLE for improved function (04/21/17)   Time 6   Period Weeks   Status On-going               Plan - 03/13/17 0830    Clinical Impression Statement Pt reported decrease in Rt knee pain by 2 points with therapeutic exercise.  Pt declined repeat of Ionto due to skin irritation and discomfort.  Trial of Rock tape applied today for support and pain relief.   Pt reported elimination of Rt knee pain at end of session.    Rehab Potential Good   PT Frequency 2x / week   PT Duration 6 weeks   PT Treatment/Interventions ADLs/Self Care Home Management;Cryotherapy;Electrical Stimulation;Iontophoresis 4mg /ml  Dexamethasone;Ultrasound;Moist Heat;Neuromuscular re-education;Therapeutic exercise;Functional mobility training;Therapeutic activities;Stair training;Gait training;Patient/family education;Manual techniques;Passive range of motion;Dry needling;Taping;Vasopneumatic Device   PT Next Visit Plan Assess response to Rock tape.  Progress Rt knee ROM/strengthening.    Consulted and Agree with Plan of Care Patient      Patient will benefit from skilled therapeutic intervention in order to improve the following deficits and impairments:  Difficulty walking, Decreased strength, Impaired flexibility, Pain, Increased edema  Visit Diagnosis: Acute pain of right knee  Muscle weakness (generalized)     Problem List Patient Active Problem List   Diagnosis Date Noted  . Right knee pain 03/02/2017  . Fatty liver disease, nonalcoholic 76/80/8811  . Vitamin D deficiency 07/20/2016  . HYPERTRIGLYCERIDEMIA 03/08/2010  . DIZZINESS 02/11/2010   Kerin Perna, PTA 03/13/17 10:03 AM  [place], [place], [address] Phone: [phone]   Fax:  [phone]  Name: Laura Johnston MRN: [id number] Date of Birth: Feb 02, 1971

## 2017-03-15 ENCOUNTER — Ambulatory Visit (INDEPENDENT_AMBULATORY_CARE_PROVIDER_SITE_OTHER): Payer: BLUE CROSS/BLUE SHIELD | Admitting: Physical Therapy

## 2017-03-15 DIAGNOSIS — M25561 Pain in right knee: Secondary | ICD-10-CM

## 2017-03-15 DIAGNOSIS — M6281 Muscle weakness (generalized): Secondary | ICD-10-CM | POA: Diagnosis not present

## 2017-03-15 NOTE — Therapy (Signed)
[place], [place], [address] Phone: [phone]   Fax:  [phone]  Physical Therapy Treatment  Patient Details  Name: Laura Johnston MRN: [id number] Date of Birth: 30-Dec-1970 Referring Provider: Dr. Dianah Field  Encounter Date: 03/15/2017      PT End of Session - 03/15/17 0808    Visit Number 3   Number of Visits 12   Date for PT Re-Evaluation 04/21/17   Authorization Type BCBS-30 visit limit   PT Start Time 0802   PT Stop Time 0845   PT Time Calculation (min) 43 min   Activity Tolerance Patient tolerated treatment well   Behavior During Therapy [facility] for tasks assessed/performed      Past Medical History:  Diagnosis Date  . Abnormal Pap smear   . AMA (advanced maternal age) multigravida 30+   . GERD (gastroesophageal reflux disease)   . Gestational diabetes   . Vaginal delivery 08/19/2011    Past Surgical History:  Procedure Laterality Date  . APPENDECTOMY    . KNEE SURGERY    . LEEP    . TUMOR REMOVAL     from buttocks    There were no vitals filed for this visit.          [facility] PT Assessment - 03/15/17 0001      Assessment   Medical Diagnosis Rt knee pain   Referring Provider Dr. Dianah Field   Next MD Visit 03/30/17     Flexibility   Quadriceps Rt knee 116 deg, Lt knee 127 deg.           [facility] Adult PT Treatment/Exercise - 03/15/17 0001      Self-Care   Self-Care Other Self-Care Comments   Other Self-Care Comments  Pt educated on self massage with stick roller to RLE musculature in sitting (rationale, how-to); pt returned demo and verbalized understanding.      Knee/Hip Exercises: Stretches   Passive Hamstring Stretch Right;Left;2 reps   Passive Hamstring Stretch Limitations supine with strap; 1 additional rep in standing   Quad Stretch Right;5 reps;30 seconds  Lt 2 reps.    Gastroc Stretch Right;Left;30 seconds;2 reps   Other Knee/Hip Stretches ITB  and adductor stretch x 30 sec in supine x 3 reps each on RLE.      Knee/Hip Exercises: Aerobic   Nustep L4: 5.5 min      Knee/Hip Exercises: Standing   Forward Step Up Right;1 set;10 reps;Hand Hold: 2;Step Height: 6"   Step Down Left;1 set;10 reps;Hand Hold: 2  3" step   Step Down Limitations (RLE up step backwards)   SLS Rt/Lt x 30 sec, repeated with horiz head turns (challenging on LLE)     Knee/Hip Exercises: Supine   Single Leg Bridge Right;Left;1 set;10 reps  fig 4.  some discomfort in Rt knee in fig 4 position.     Manual Therapy   Manual Therapy Taping   Manual therapy comments 2 -I strips of sensitive skin Rock tape place on Rt medial knee at joint line, 15% stretch to decompress tissue, provide support, increase proprioception.           PT Long Term Goals - 03/13/17 0900      PT LONG TERM GOAL #1   Title independent with HEP (04/21/17)   Time 6   Period Weeks   Status On-going     PT LONG TERM GOAL #2   Title perform Rt knee ROM without pain for improved function (04/21/17)  Time 6   Period Weeks   Status On-going     PT LONG TERM GOAL #3   Title report ability to walk > 45 min without increase in pain for improved functional mobility (04/21/17)   Time 6   Period Weeks   Status On-going     PT LONG TERM GOAL #4   Title demonstrate at least 4/5 in RLE for improved function (04/21/17)   Time 6   Period Weeks   Status On-going               Plan - 03/15/17 6333    Clinical Impression Statement Pt demonstrated improved Rt quad flexibility compared to 2 days ago.  Pt reported decreased pain at medial joint line with Rock tape application ;reapplied today.  Pt had some difficulty tolerating stair exercise due to fatigue and increased discomfort at end of 10 reps.  Tightness/tenderness noted in Rt adductors/hamstring (mid-muscle).  Progressing towards goals.    Rehab Potential Good   PT Frequency 2x / week   PT Duration 6 weeks   PT Next Visit Plan   Progress Rt knee ROM/strengthening. Manual therapy to Rt hamstring/adductors.    Consulted and Agree with Plan of Care Patient      Patient will benefit from skilled therapeutic intervention in order to improve the following deficits and impairments:  Difficulty walking, Decreased strength, Impaired flexibility, Pain, Increased edema  Visit Diagnosis: Acute pain of right knee  Muscle weakness (generalized)     Problem List Patient Active Problem List   Diagnosis Date Noted  . Right knee pain 03/02/2017  . Fatty liver disease, nonalcoholic 54/56/2563  . Vitamin D deficiency 07/20/2016  . HYPERTRIGLYCERIDEMIA 03/08/2010  . DIZZINESS 02/11/2010   Kerin Perna, PTA 03/15/17 9:21 AM  [place], [place], [address] Phone: [phone]   Fax:  [phone]  Name: Laura Johnston MRN: [id number] Date of Birth: [birth year]

## 2017-03-22 ENCOUNTER — Ambulatory Visit (INDEPENDENT_AMBULATORY_CARE_PROVIDER_SITE_OTHER): Payer: BLUE CROSS/BLUE SHIELD | Admitting: Physical Therapy

## 2017-03-22 DIAGNOSIS — M25561 Pain in right knee: Secondary | ICD-10-CM

## 2017-03-22 DIAGNOSIS — M6281 Muscle weakness (generalized): Secondary | ICD-10-CM

## 2017-03-22 NOTE — Patient Instructions (Addendum)
Adductor Stretch: Reclined (Strap, Wall)    Warm up with leg vertical. Rotate leg to side and fix foot to wall. Anchor opposite hip. Hold for ___30_ seconds. Repeat _2___ times each leg.  Copyright  VHI. All rights reserved.  Outer Hip Stretch: Reclined IT Band Stretch (Strap)    Strap around opposite foot, pull across only as far as possible with shoulders on mat. Hold for _30___ seconds. Repeat _2___ times each leg.   [facility] [place], [place] [address]  [phone] (office) [phone] (fax)

## 2017-03-22 NOTE — Therapy (Signed)
[place] [place] [place], [place], [address] Phone: [phone]   Fax:  [phone]  Physical Therapy Treatment  Patient Details  Name: Laura Johnston MRN: [id number] Date of Birth: [birth year] Referring Provider: Dr. Dianah Field  Encounter Date: 03/22/2017      PT End of Session - 03/22/17 0806    Visit Number 4   Number of Visits 12   Date for PT Re-Evaluation 04/21/17   Authorization Type BCBS-30 visit limit   PT Start Time 0801   PT Stop Time 0856   PT Time Calculation (min) 55 min   Behavior During Therapy [facility] for tasks assessed/performed      Past Medical History:  Diagnosis Date  . Abnormal Pap smear   . AMA (advanced maternal age) multigravida 39+   . GERD (gastroesophageal reflux disease)   . Gestational diabetes   . Vaginal delivery 08/19/2011    Past Surgical History:  Procedure Laterality Date  . APPENDECTOMY    . KNEE SURGERY    . LEEP    . TUMOR REMOVAL     from buttocks    There were no vitals filed for this visit.      Subjective Assessment - 03/22/17 0806    Subjective Pt reports she has been getting up from desk often during day; her boss even reminds her to get up.  She is only having pain (up to 5/10) in Rt knee when she lays down.  She complains of "cracking" at the lateral part of knee last 2 days.    Currently in Pain? No/denies   Pain Score 0-No pain            [facility] PT Assessment - 03/22/17 0001      Flexibility   Quadriceps Rt knee 125 deg, Lt 134 deg          [facility] Adult PT Treatment/Exercise - 03/22/17 0001      Knee/Hip Exercises: Stretches   Passive Hamstring Stretch Right;Left;2 reps   Sports administrator Right;Left;3 reps;30 seconds   Gastroc Stretch Right;Left;30 seconds;3 reps   Other Knee/Hip Stretches ITB and adductor stretch x 30 sec in supine x 3 reps each on RLE.      Knee/Hip Exercises: Aerobic   Nustep L4: 5.5 min      Modalities   Modalities Electrical Stimulation;Moist Heat     Moist Heat Therapy   Number Minutes Moist Heat 15 Minutes   Moist Heat Location --  Rt hamstring     Electrical Stimulation   Electrical Stimulation Location Rt hamstring / adductor    Electrical Stimulation Action IFC   Electrical Stimulation Parameters to tolerance    Electrical Stimulation Goals Pain;Tone     Manual Therapy   Manual Therapy Taping;Soft tissue mobilization   Manual therapy comments 2 -I strips of sensitive skin Rock tape place on Rt lateral knee at joint line, 15% stretch to decompress tissue, provide support, increase proprioception.    Soft tissue mobilization Edge tool assistance to Rt lateral/ distal hamstring to decrease fascial tightness; TPR to same area; STM to Rt adductors, glute, piriformis, hamstrings                 PT Education - 03/22/17 1056    Education provided Yes   Education Details HEP   Person(s) Educated Patient   Methods Explanation;Handout   Comprehension Returned demonstration;Verbalized understanding             PT Long Term Goals -  03/22/17 1052      PT LONG TERM GOAL #1   Title independent with HEP (04/21/17)   Time 6   Period Weeks   Status On-going     PT LONG TERM GOAL #2   Title perform Rt knee ROM without pain for improved function (04/21/17)   Time 6   Period Weeks   Status On-going     PT LONG TERM GOAL #3   Title report ability to walk > 45 min without increase in pain for improved functional mobility (04/21/17)   Time 6   Period Weeks   Status On-going     PT LONG TERM GOAL #4   Title demonstrate at least 4/5 in RLE for improved function (04/21/17)   Time 6   Period Weeks   Status On-going               Plan - 03/22/17 2706    Clinical Impression Statement Pt demonstrated improved quad flexibility bilaterally; Rt knee flexion was 97 deg initially 3 sessions ago and is now 125 deg.  Pt reports 75% improvement of symptoms since initiating  therapy.  Pt having more clicking and discomfort at Rt ITB distal attchament; point tender with manual therapy.  Pt making good progress towards established goals.    Rehab Potential Good   PT Frequency 2x / week   PT Duration 6 weeks   PT Treatment/Interventions ADLs/Self Care Home Management;Cryotherapy;Electrical Stimulation;Iontophoresis 4mg /ml Dexamethasone;Ultrasound;Moist Heat;Neuromuscular re-education;Therapeutic exercise;Functional mobility training;Therapeutic activities;Stair training;Gait training;Patient/family education;Manual techniques;Passive range of motion;Dry needling;Taping;Vasopneumatic Device   PT Next Visit Plan  Progress Rt knee ROM/strengthening. Manual therapy to Rt hamstring/adductors. Assess strength and goals.    Consulted and Agree with Plan of Care Patient      Patient will benefit from skilled therapeutic intervention in order to improve the following deficits and impairments:  Difficulty walking, Decreased strength, Impaired flexibility, Pain, Increased edema  Visit Diagnosis: Acute pain of right knee  Muscle weakness (generalized)     Problem List Patient Active Problem List   Diagnosis Date Noted  . Right knee pain 03/02/2017  . Fatty liver disease, nonalcoholic 23/76/2831  . Vitamin D deficiency 07/20/2016  . HYPERTRIGLYCERIDEMIA 03/08/2010  . DIZZINESS 02/11/2010   Kerin Perna, PTA 03/22/17 10:57 AM  [facility] [place], [place], [address] Phone: [phone]   Fax:  [phone]  Name: Laura Johnston MRN: [id number] Date of Birth: 1971-08-04

## 2017-03-24 ENCOUNTER — Ambulatory Visit (INDEPENDENT_AMBULATORY_CARE_PROVIDER_SITE_OTHER): Payer: BLUE CROSS/BLUE SHIELD | Admitting: Physical Therapy

## 2017-03-24 DIAGNOSIS — M25561 Pain in right knee: Secondary | ICD-10-CM | POA: Diagnosis not present

## 2017-03-24 DIAGNOSIS — M6281 Muscle weakness (generalized): Secondary | ICD-10-CM | POA: Diagnosis not present

## 2017-03-24 NOTE — Patient Instructions (Signed)

## 2017-03-24 NOTE — Therapy (Signed)
[place], [place], [address] Phone: [phone]   Fax:  [phone]  Physical Therapy Treatment  Patient Details  Name: Laura Johnston MRN: [id number] Date of Birth: [birth year] Referring Provider: Dr. Dianah Field   Encounter Date: 03/24/2017      PT End of Session - 03/24/17 0852    Visit Number 5   Number of Visits 12   Date for PT Re-Evaluation 04/21/17   Authorization Type BCBS-30 visit limit   PT Start Time 0845   PT Stop Time 0939   PT Time Calculation (min) 54 min   Activity Tolerance Patient tolerated treatment well   Behavior During Therapy [facility] for tasks assessed/performed      Past Medical History:  Diagnosis Date  . Abnormal Pap smear   . AMA (advanced maternal age) multigravida 70+   . GERD (gastroesophageal reflux disease)   . Gestational diabetes   . Vaginal delivery 08/19/2011    Past Surgical History:  Procedure Laterality Date  . APPENDECTOMY    . KNEE SURGERY    . LEEP    . TUMOR REMOVAL     from buttocks    There were no vitals filed for this visit.      Subjective Assessment - 03/24/17 0852    Subjective Pt reports she had some relief from symptoms with use of estim last visit.  Her Rock tape lasted until last night when she took a shower.  She continues to have clicking at Rt knee and ankle with stairs.  Having pain in knee with stairs.     Currently in Pain? No/denies   Pain Score 0-No pain            [facility] PT Assessment - 03/24/17 0001      Assessment   Medical Diagnosis Rt knee pain   Referring Provider Dr. Dianah Field    Next MD Visit 03/30/17   Prior Therapy following surgery, none since     Strength   Right Hip Flexion --  5-/5   Right Hip Extension --  5-/5   Right Hip ABduction 4+/5   Left Hip Flexion 5/5   Left Hip Extension --  5-/5   Left Hip ABduction 4+/5   Right Knee Flexion --  5-/5   Right Knee Extension 5/5   Left  Knee Flexion 5/5   Left Knee Extension 5/5          [facility] Adult PT Treatment/Exercise - 03/24/17 0001      Knee/Hip Exercises: Stretches   Passive Hamstring Stretch Right;Left;2 reps   Quad Stretch Right;Left;3 reps;30 seconds   Gastroc Stretch Right;Left;30 seconds;3 reps   Other Knee/Hip Stretches ITB and adductor stretch x 30 sec in supine x 3 reps each on RLE.      Knee/Hip Exercises: Aerobic   Nustep L4: 7.5 min      Knee/Hip Exercises: Supine   Single Leg Bridge Right;Left;1 set;10 reps  fig 4. 5 second hold in ext.     Knee/Hip Exercises: Sidelying   Hip ABduction Strengthening;Right;1 set;10 reps  2 sets   Clams Rt/Lt x 10 reps      Modalities   Modalities --  pt declined; had to get to work.      Manual Therapy   Soft tissue mobilization STM to Rt hamstring, adductor, glute            PT Education - 03/24/17 0913    Education provided Yes   Education  Details info on Dry needling.    Person(s) Educated Patient   Methods Explanation;Handout   Comprehension Verbalized understanding             PT Long Term Goals - 03/24/17 0909      PT LONG TERM GOAL #1   Title independent with HEP (04/21/17)   Time 6   Period Weeks   Status On-going     PT LONG TERM GOAL #2   Title perform Rt knee ROM without pain for improved function (04/21/17)   Time 6   Period Weeks   Status Achieved     PT LONG TERM GOAL #3   Title report ability to walk > 45 min without increase in pain for improved functional mobility (04/21/17)   Time 6   Period Weeks   Status On-going     PT LONG TERM GOAL #4   Title demonstrate at least 4/5 in RLE for improved function (04/21/17)   Time 6   Period Weeks   Status Achieved               Plan - 03/24/17 0925    Clinical Impression Statement Pt continues with clicking and pain in Rt knee with stairs. Rt hip /knee musculature tight and tender with palpation.  Her LE strength has improved.  She has met LTG #2 and 4.   Making good gains towards remaining goals.    Rehab Potential Good   PT Frequency 2x / week   PT Duration 6 weeks   PT Treatment/Interventions ADLs/Self Care Home Management;Cryotherapy;Electrical Stimulation;Iontophoresis 63m/ml Dexamethasone;Ultrasound;Moist Heat;Neuromuscular re-education;Therapeutic exercise;Functional mobility training;Therapeutic activities;Stair training;Gait training;Patient/family education;Manual techniques;Passive range of motion;Dry needling;Taping;Vasopneumatic Device   PT Next Visit Plan Possible DN to RLE.     Consulted and Agree with Plan of Care Patient      Patient will benefit from skilled therapeutic intervention in order to improve the following deficits and impairments:  Difficulty walking, Decreased strength, Impaired flexibility, Pain, Increased edema  Visit Diagnosis: Acute pain of right knee  Muscle weakness (generalized)     Problem List Patient Active Problem List   Diagnosis Date Noted  . Right knee pain 03/02/2017  . Fatty liver disease, nonalcoholic 124/82/5003 . Vitamin D deficiency 07/20/2016  . HYPERTRIGLYCERIDEMIA 03/08/2010  . DIZZINESS 02/11/2010   JKerin Perna PTA 03/24/17 9:46 AM  [facility] [address]Phone: [phone]  Fax:  [phone] Name: Laura RomanekMRN: [id number] of Birth: 3Dec 30, 1972

## 2017-03-27 ENCOUNTER — Ambulatory Visit (INDEPENDENT_AMBULATORY_CARE_PROVIDER_SITE_OTHER): Payer: BLUE CROSS/BLUE SHIELD | Admitting: Physical Therapy

## 2017-03-27 ENCOUNTER — Encounter: Payer: Self-pay | Admitting: Physical Therapy

## 2017-03-27 DIAGNOSIS — M25561 Pain in right knee: Secondary | ICD-10-CM | POA: Diagnosis not present

## 2017-03-27 DIAGNOSIS — M6281 Muscle weakness (generalized): Secondary | ICD-10-CM | POA: Diagnosis not present

## 2017-03-27 NOTE — Therapy (Signed)
[place], [place], [address] Phone: [phone]   Fax:  [phone]  Physical Therapy Treatment  Patient Details  Name: Laura Johnston MRN: [id number] Date of Birth: [birth year] Referring Provider: Dr Dianah Field  Encounter Date: 03/27/2017      PT End of Session - 03/27/17 0805    Visit Number 6   Number of Visits 12   Date for PT Re-Evaluation 04/21/17   Authorization Type BCBS-30 visit limit   PT Start Time 0801   PT Stop Time 0859   PT Time Calculation (min) 58 min   Activity Tolerance Patient tolerated treatment well      Past Medical History:  Diagnosis Date  . Abnormal Pap smear   . AMA (advanced maternal age) multigravida 96+   . GERD (gastroesophageal reflux disease)   . Gestational diabetes   . Vaginal delivery 08/19/2011    Past Surgical History:  Procedure Laterality Date  . APPENDECTOMY    . KNEE SURGERY    . LEEP    . TUMOR REMOVAL     from buttocks    There were no vitals filed for this visit.      Subjective Assessment - 03/27/17 0802    Subjective Pt was at a baby shower on Saturday and chasing after her son and she fell on the grass to the left side.  Her pain has been worse on the out side of the knee. She is having cracking in her lateral lower knee and ankle Rt now.    Patient Stated Goals improve pain, be comfortable walking   Currently in Pain? Yes   Pain Score 8    Pain Location Knee   Pain Orientation Right;Lateral   Pain Descriptors / Indicators Throbbing;Aching   Pain Type Acute pain   Pain Onset More than a month ago   Pain Frequency Constant   Aggravating Factors  her fall   Pain Relieving Factors medicine            [facility] PT Assessment - 03/27/17 0001      Assessment   Medical Diagnosis Rt knee pain   Referring Provider Dr Dianah Field     Observation/Other Assessments-Edema    Edema --  (+) lateral Rt knee     Strength   Right Hip ABduction --  5-/5   Left Hip ABduction 4+/5     Flexibility   Quadriceps prone knee flex Rt 130     Special Tests    Special Tests Knee Special Tests                     [facility] Adult PT Treatment/Exercise - 03/27/17 0001      Knee/Hip Exercises: Stretches   Sports administrator 2 reps;60 seconds;Right  with straps   Piriformis Stretch Both;1 rep   Other Knee/Hip Stretches ITB cross body with strap     Knee/Hip Exercises: Aerobic   Nustep L4: 5 min      Knee/Hip Exercises: Sidelying   Other Sidelying Knee/Hip Exercises 2x10 hip abduct with toe taps in front and behind     Modalities   Modalities Electrical Stimulation;Moist Heat;Cryotherapy     Moist Heat Therapy   Number Minutes Moist Heat 15 Minutes   Moist Heat Location --  Rt quad     Cryotherapy   Number Minutes Cryotherapy 15 Minutes   Cryotherapy Location Knee  Rt   Type of Cryotherapy Ice pack     Electrical  Stimulation   Electrical Stimulation Location Rt Oncologist IFC   Electrical Stimulation Parameters to tolerance   Electrical Stimulation Goals Pain;Tone     Manual Therapy   Soft tissue mobilization STM to Rt quad, hamstring and gastroc          Trigger Point Dry Needling - 03/27/17 8101    Consent Given? Yes   Education Handout Provided Yes   Muscles Treated Lower Body Quadriceps   Quadriceps Response Palpable increased muscle length;Twitch response elicited  vastus lateralis with stim Rt LE                   PT Long Term Goals - 03/27/17 7510      PT LONG TERM GOAL #1   Title independent with HEP (04/21/17)   Status On-going     PT LONG TERM GOAL #2   Title perform Rt knee ROM without pain for improved function (04/21/17)   Status On-going     PT LONG TERM GOAL #3   Title report ability to walk > 45 min without increase in pain for improved functional mobility (04/21/17)   Status On-going     PT LONG TERM GOAL #4    Title demonstrate at least 4/5 in RLE for improved function (04/21/17)   Status Achieved               Plan - 03/27/17 0849    Clinical Impression Statement Izola had a fall on Saturday and has had some increase in lateral Rt knee pain since.  Ligamentous testing was negative as was pain with manual muscle test.  She does have pain with palpation and some edema.  She had good release with DB to her latera quad.  Didn't tolerate a lot of this.     Rehab Potential Good   PT Frequency 2x / week   PT Duration 6 weeks   PT Treatment/Interventions ADLs/Self Care Home Management;Cryotherapy;Electrical Stimulation;Iontophoresis 4mg /ml Dexamethasone;Ultrasound;Moist Heat;Neuromuscular re-education;Therapeutic exercise;Functional mobility training;Therapeutic activities;Stair training;Gait training;Patient/family education;Manual techniques;Passive range of motion;Dry needling;Taping;Vasopneumatic Device   PT Next Visit Plan assess response to DN and if patient is open do some more.    Consulted and Agree with Plan of Care Patient      Patient will benefit from skilled therapeutic intervention in order to improve the following deficits and impairments:  Difficulty walking, Decreased strength, Impaired flexibility, Pain, Increased edema  Visit Diagnosis: Muscle weakness (generalized)  Acute pain of right knee     Problem List Patient Active Problem List   Diagnosis Date Noted  . Right knee pain 03/02/2017  . Fatty liver disease, nonalcoholic 25/85/2778  . Vitamin D deficiency 07/20/2016  . HYPERTRIGLYCERIDEMIA 03/08/2010  . DIZZINESS 02/11/2010    Jeral Pinch PT  03/27/2017, 9:00 AM  [facility] [place], [place], [address] Phone: [phone]   Fax:  [phone]  Name: Velera Lansdale MRN: [id number] Date of Birth: [birth year]

## 2017-03-29 ENCOUNTER — Ambulatory Visit (INDEPENDENT_AMBULATORY_CARE_PROVIDER_SITE_OTHER): Payer: BLUE CROSS/BLUE SHIELD | Admitting: Physical Therapy

## 2017-03-29 ENCOUNTER — Encounter: Payer: Self-pay | Admitting: Physical Therapy

## 2017-03-29 DIAGNOSIS — M25561 Pain in right knee: Secondary | ICD-10-CM | POA: Diagnosis not present

## 2017-03-29 DIAGNOSIS — M6281 Muscle weakness (generalized): Secondary | ICD-10-CM | POA: Diagnosis not present

## 2017-03-29 NOTE — Therapy (Signed)
[place], [place], [address] Phone: [phone]   Fax:  [phone]  Physical Therapy Treatment  Patient Details  Name: Laura Johnston MRN: [id number] Date of Birth: 16-Apr-1971 Referring Provider: Dr Dianah Field  Encounter Date: 03/29/2017      PT End of Session - 03/29/17 0801    Visit Number 7   Number of Visits 12   Date for PT Re-Evaluation 04/21/17   Authorization Type BCBS-30 visit limit   PT Start Time 0801   PT Stop Time 0904   PT Time Calculation (min) 63 min   Activity Tolerance Patient tolerated treatment well      Past Medical History:  Diagnosis Date  . Abnormal Pap smear   . AMA (advanced maternal age) multigravida 54+   . GERD (gastroesophageal reflux disease)   . Gestational diabetes   . Vaginal delivery 08/19/2011    Past Surgical History:  Procedure Laterality Date  . APPENDECTOMY    . KNEE SURGERY    . LEEP    . TUMOR REMOVAL     from buttocks    There were no vitals filed for this visit.      Subjective Assessment - 03/29/17 0802    Subjective She did well after last treatment, was sore for about 2-3 hrs.    Pertinent History Rt knee patella tendon rupture s/p repair (1996)   Patient Stated Goals improve pain, be comfortable walking   Currently in Pain? Yes   Pain Score 4    Pain Location Knee   Pain Orientation Right   Pain Descriptors / Indicators Aching   Pain Type Acute pain   Pain Radiating Towards into calf pain " seems to be moving around"   Pain Onset More than a month ago   Pain Frequency Constant                         [facility] Adult PT Treatment/Exercise - 03/29/17 0001      Exercises   Exercises Knee/Hip     Knee/Hip Exercises: Stretches   Quad Stretch Right;1 rep  45 sec with strap   Gastroc Stretch Both;30 seconds     Knee/Hip Exercises: Aerobic   Nustep L5: 6 min      Knee/Hip Exercises: Standing   Lateral Step Up Both;3 sets;10 reps;Step Height: 8"  cross step ups   SLS 3x8 sit to stand, Rt LE some pain lateral posterior Rt knee     Knee/Hip Exercises: Supine   Straight Leg Raise with External Rotation Strengthening;Both  3x10, VC for form     Modalities   Modalities Electrical Stimulation;Moist Heat     Moist Heat Therapy   Number Minutes Moist Heat 15 Minutes   Moist Heat Location --  Rt posterior leg and quad     Electrical Stimulation   Electrical Stimulation Location Rt hamstring/gastroc/quad   Electrical Stimulation Action premod   Electrical Stimulation Parameters To tolerance   Electrical Stimulation Goals Pain;Tone     Manual Therapy   Manual Therapy Soft tissue mobilization   Manual therapy comments active assist Rt quad stretch, prone    Soft tissue mobilization STM to Rt hamstring and gastroc with roller and manually.                      PT Long Term Goals - 03/27/17 2505      PT LONG TERM GOAL #1  Title independent with HEP (04/21/17)   Status On-going     PT LONG TERM GOAL #2   Title perform Rt knee ROM without pain for improved function (04/21/17)   Status On-going     PT LONG TERM GOAL #3   Title report ability to walk > 45 min without increase in pain for improved functional mobility (04/21/17)   Status On-going     PT LONG TERM GOAL #4   Title demonstrate at least 4/5 in RLE for improved function (04/21/17)   Status Achieved               Plan - 03/29/17 0818    Clinical Impression Statement Pt feels the DN helped, she does wish to wait a little longer before trying again.  Tolerated strengthening exercise well. She has difficulty with higher level strengthening activities.    Rehab Potential Good   PT Frequency 2x / week   PT Duration 6 weeks   PT Treatment/Interventions ADLs/Self Care Home Management;Cryotherapy;Electrical Stimulation;Iontophoresis 4mg /ml Dexamethasone;Ultrasound;Moist Heat;Neuromuscular  re-education;Therapeutic exercise;Functional mobility training;Therapeutic activities;Stair training;Gait training;Patient/family education;Manual techniques;Passive range of motion;Dry needling;Taping;Vasopneumatic Device   PT Next Visit Plan add eccentric quad work to [employer], DN to Rt hamstring, gastroc TFL   Consulted and Agree with Plan of Care Patient      Patient will benefit from skilled therapeutic intervention in order to improve the following deficits and impairments:  Difficulty walking, Decreased strength, Impaired flexibility, Pain, Increased edema  Visit Diagnosis: Muscle weakness (generalized)  Acute pain of right knee     Problem List Patient Active Problem List   Diagnosis Date Noted  . Right knee pain 03/02/2017  . Fatty liver disease, nonalcoholic 24/46/9507  . Vitamin D deficiency 07/20/2016  . HYPERTRIGLYCERIDEMIA 03/08/2010  . DIZZINESS 02/11/2010    Jeral Pinch PT 03/29/2017, 9:06 AM  [facility] [place], [place], [address] Phone: [phone]   Fax:  [phone]  Name: Meerab Maselli MRN: [id number] Date of Birth: 1971-02-12

## 2017-03-30 ENCOUNTER — Encounter: Payer: Self-pay | Admitting: Sports Medicine

## 2017-03-30 ENCOUNTER — Ambulatory Visit (INDEPENDENT_AMBULATORY_CARE_PROVIDER_SITE_OTHER): Payer: BLUE CROSS/BLUE SHIELD | Admitting: Sports Medicine

## 2017-03-30 DIAGNOSIS — M1711 Unilateral primary osteoarthritis, right knee: Secondary | ICD-10-CM | POA: Diagnosis not present

## 2017-03-30 NOTE — Assessment & Plan Note (Signed)
Did extremely well with steroid injection and physical therapy, continue Voltaren as needed. Continue physical therapy, I do not think that the left over wires from her patellar tendon reconstruction are clinically relevant. If persistent pain we can always do another steroid injection or proceed to Viscosupplementation. Return in 2 months for a final recheck.

## 2017-03-30 NOTE — Progress Notes (Signed)
  Subjective:    CC: Follow-up  HPI: Right knee osteoarthritis: Doing extremely well after steroid injection and physical therapy. She doesn't need medication anymore, she is more active, has had and tells me she is continuing to improve.  Past medical history:  Negative.  See flowsheet/record as well for more information.  Surgical history: Negative.  See flowsheet/record as well for more information.  Family history: Negative.  See flowsheet/record as well for more information.  Social history: Negative.  See flowsheet/record as well for more information.  Allergies, and medications have been entered into the medical record, reviewed, and no changes needed.   Review of Systems: No fevers, chills, night sweats, weight loss, chest pain, or shortness of breath.   Objective:    General: Well Developed, well nourished, and in no acute distress.  Neuro: Alert and oriented x3, extra-ocular muscles intact, sensation grossly intact.  HEENT: Normocephalic, atraumatic, pupils equal round reactive to light, neck supple, no masses, no lymphadenopathy, thyroid nonpalpable.  Skin: Warm and dry, no rashes. Cardiac: Regular rate and rhythm, no murmurs rubs or gallops, no lower extremity edema.  Respiratory: Clear to auscultation bilaterally. Not using accessory muscles, speaking in full sentences. Right Knee: Normal to inspection with no erythema or effusion or obvious bony abnormalities. Palpation normal with no warmth or joint line tenderness or patellar tenderness or condyle tenderness. ROM normal in flexion and extension and lower leg rotation. Ligaments with solid consistent endpoints including ACL, PCL, LCL, MCL. Negative Mcmurray's and provocative meniscal tests. Non painful patellar compression. Patellar and quadriceps tendons unremarkable. Hamstring and quadriceps strength is normal.  Impression and Recommendations:    Primary osteoarthritis of right knee Did extremely well with steroid  injection and physical therapy, continue Voltaren as needed. Continue physical therapy, I do not think that the left over wires from her patellar tendon reconstruction are clinically relevant. If persistent pain we can always do another steroid injection or proceed to Viscosupplementation. Return in 2 months for a final recheck.  I spent 25 minutes with this patient, greater than 50% was face-to-face time counseling regarding the above diagnoses

## 2017-04-05 ENCOUNTER — Ambulatory Visit (INDEPENDENT_AMBULATORY_CARE_PROVIDER_SITE_OTHER): Payer: BLUE CROSS/BLUE SHIELD | Admitting: Physical Therapy

## 2017-04-05 DIAGNOSIS — M6281 Muscle weakness (generalized): Secondary | ICD-10-CM

## 2017-04-05 DIAGNOSIS — M25561 Pain in right knee: Secondary | ICD-10-CM | POA: Diagnosis not present

## 2017-04-05 NOTE — Therapy (Signed)
[facility] [place] [address] [place], [place], [address] Phone: [phone]   Fax:  [phone]  Physical Therapy Treatment  Patient Details  Name: Laura Johnston MRN: [id number] Date of Birth: June 22, 1971 Referring Provider: Dr. Benjamin Stain  Encounter Date: 04/05/2017      PT End of Session - 04/05/17 0806    Visit Number 8   Number of Visits 12   Date for PT Re-Evaluation 04/21/17   Authorization Type BCBS-30 visit limit   PT Start Time 0803   PT Stop Time 0843   PT Time Calculation (min) 40 min   Activity Tolerance Patient tolerated treatment well;No increased pain   Behavior During Therapy WFL for tasks assessed/performed      Past Medical History:  Diagnosis Date  . Abnormal Pap smear   . AMA (advanced maternal age) multigravida 35+   . GERD (gastroesophageal reflux disease)   . Gestational diabetes   . Vaginal delivery 08/19/2011    Past Surgical History:  Procedure Laterality Date  . APPENDECTOMY    . KNEE SURGERY    . LEEP    . TUMOR REMOVAL     from buttocks    There were no vitals filed for this visit.      Subjective Assessment - 04/05/17 0806    Subjective Laura Johnston reports she is doing better since last treatment.  She feels the DN helped.  Her Rt knee feels "sensitive to cold".  She has some sharp pains in her Rt knee when walking on uneven ground; resolves by next day.     Patient Stated Goals improve pain, be comfortable walking   Currently in Pain? No/denies   Pain Score 0-No pain   Pain Location Knee   Pain Orientation Right            [facility] PT Assessment - 04/05/17 0001      Assessment   Medical Diagnosis Rt knee pain   Referring Provider Dr. Benjamin Stain   Onset Date/Surgical Date --  12/2016     Flexibility   Quadriceps Rt knee 130 deg.            [facility] Adult PT Treatment/Exercise - 04/05/17 0001      Knee/Hip Exercises: Stretches   Passive Hamstring Stretch  Right;Left;2 reps;30 seconds   Passive Hamstring Stretch Limitations (toes in/out/   Quad Stretch Right;Left;3 reps;30 seconds   Quad Stretch Limitations one rep in sitting, for review (HEP)   Gastroc Stretch Right;Left;2 reps   Other Knee/Hip Stretches ITB cross body with strap 30 sec, 3 reps each leg.      Knee/Hip Exercises: Aerobic   Nustep L5: 7 min      Knee/Hip Exercises: Standing   SLS Rt SLS on blue pad x 30 sec, 3 reps each side.  Rt SLS, with horiz head turns   Other Standing Knee Exercises marching on non-compliant surface x 1 min; repeated on Bosu.    Other Standing Knee Exercises Single leg squat to elevated table x 5 reps each side (challenging; some pain in Rt lateral/post knee)      Pt declined modalities; no pain. Will perform at home if needed.       PT Long Term Goals - 04/05/17 0809      PT LONG TERM GOAL #1   Title independent with HEP (04/21/17)   Time 6   Period Weeks   Status On-going     PT LONG TERM GOAL #2   Title perform Rt  knee ROM without pain for improved function (04/21/17)   Time 6   Period Weeks   Status On-going     PT LONG TERM GOAL #3   Title report ability to walk > 45 min without increase in pain for improved functional mobility (04/21/17)   Time 6   Period Weeks   Status Achieved     PT LONG TERM GOAL #4   Title demonstrate at least 4/5 in RLE for improved function (04/21/17)   Time 6   Period Weeks   Status Achieved               Plan - 04/05/17 0957    Clinical Impression Statement Pt reporting overall decrease in Rt knee pain, except with gait in uneven surfaces.  She had some lat/post Rt knee pain with single leg squats today, resolved with rest.  She has met LTG#3 and is progressing well towards remaining goals.    Rehab Potential Good   PT Frequency 2x / week   PT Duration 6 weeks   PT Treatment/Interventions ADLs/Self Care Home Management;Cryotherapy;Electrical Stimulation;Iontophoresis 77m/ml  Dexamethasone;Ultrasound;Moist Heat;Neuromuscular re-education;Therapeutic exercise;Functional mobility training;Therapeutic activities;Stair training;Gait training;Patient/family education;Manual techniques;Passive range of motion;Dry needling;Taping;Vasopneumatic Device   PT Next Visit Plan Trial of bicycle for warm up (she's interested in biking at home) and TENS unit instruction. Assess readiness to d/c.    Consulted and Agree with Plan of Care Patient      Patient will benefit from skilled therapeutic intervention in order to improve the following deficits and impairments:  Difficulty walking, Decreased strength, Impaired flexibility, Pain, Increased edema  Visit Diagnosis: Muscle weakness (generalized)  Acute pain of right knee     Problem List Patient Active Problem List   Diagnosis Date Noted  . Primary osteoarthritis of right knee 03/02/2017  . Fatty liver disease, nonalcoholic 127/25/3664 . Vitamin D deficiency 07/20/2016  . HYPERTRIGLYCERIDEMIA 03/08/2010  . DIZZINESS 02/11/2010   JKerin Perna PTA 04/05/17 12:13 PM  [place] [address]Phone: [phone]  Fax:  [phone] Name: Laura AlcoserMRN: [id number] of Birth: 302-16-1972

## 2017-04-07 ENCOUNTER — Ambulatory Visit (INDEPENDENT_AMBULATORY_CARE_PROVIDER_SITE_OTHER): Payer: BLUE CROSS/BLUE SHIELD | Admitting: Physical Therapy

## 2017-04-07 DIAGNOSIS — M6281 Muscle weakness (generalized): Secondary | ICD-10-CM | POA: Diagnosis not present

## 2017-04-07 DIAGNOSIS — M25561 Pain in right knee: Secondary | ICD-10-CM

## 2017-04-07 NOTE — Patient Instructions (Addendum)
Tandem Stance    Right foot in front of left, heel touching toe both feet "straight ahead". Stand on Foot Triangle of Support with both feet. Balance in this position ___ seconds. Do with left foot in front of right.   Balance: Unilateral - Foam    Eyes open, balance with right leg on dense foam. Hold __30__ seconds. Repeat _2___ times per set. Do __1__ sessions per day. Perform exercise with head turns left and right when this is too easy.  * Continue performing stretches previously issued.   TENS stands for Transcutaneous Electrical Nerve Stimulation. In other words, electrical impulses are allowed to pass through the skin in order to excite a nerve.   Purpose and Use of TENS:  TENS is a method used to manage acute and chronic pain without the use of drugs. It has been effective in managing pain associated with surgery, sprains, strains, trauma, rheumatoid arthritis, and neuralgias. It is a non-addictive, low risk, and non-invasive technique used to control pain. It is not, by any means, a curative form of treatment.   How TENS Works:  Most TENS units are a Paramedic unit powered by one 9 volt battery. Attached to the outside of the unit are two lead wires where two pins and/or snaps connect on each wire. All units come with a set of four reusable pads or electrodes. These are placed on the skin surrounding the area involved. By inserting the leads into  the pads, the electricity can pass from the unit making the circuit complete.  As the intensity is turned up slowly, the electrical current enters the body from the electrodes through the skin to the surrounding nerve fibers. This triggers the release of hormones from within the body. These hormones contain pain relievers. By increasing the circulation of these hormones, the person's pain may be lessened. It is also believed that the electrical stimulation itself helps to block the pain messages being sent to the brain, thus also  decreasing the body's perception of pain.   Hazards:  TENS units are NOT to be used by patients with PACEMAKERS, DEFIBRILLATORS, DIABETIC PUMPS, PREGNANT WOMEN, and patients with SEIZURE DISORDERS.  TENS units are NOT to be used over the heart, throat, brain, or spinal cord.  One of the major side effects from the TENS unit may be skin irritation. Some people may develop a rash if they are sensitive to the materials used in the electrodes or the connecting wires.   Avoid overuse due the body getting used to the stem making it not as effective over time.   Copyright  VHI. All rights reserved.

## 2017-04-07 NOTE — Therapy (Addendum)
[facility], [place], [address] Phone: [phone]   Fax:  [phone]  Physical Therapy Treatment  Patient Details  Name: Laura Johnston MRN: [id number] Date of Birth: 10-10-1971 Referring Provider: Dr. Dianah Field  Encounter Date: 04/07/2017      PT End of Session - 04/07/17 0844    Visit Number 9   Number of Visits 12   Date for PT Re-Evaluation 04/21/17   Authorization Type BCBS-30 visit limit   PT Start Time 0801   PT Stop Time 0844   PT Time Calculation (min) 43 min   Activity Tolerance Patient tolerated treatment well;No increased pain   Behavior During Therapy WFL for tasks assessed/performed      Past Medical History:  Diagnosis Date  . Abnormal Pap smear   . AMA (advanced maternal age) multigravida 53+   . GERD (gastroesophageal reflux disease)   . Gestational diabetes   . Vaginal delivery 08/19/2011    Past Surgical History:  Procedure Laterality Date  . APPENDECTOMY    . KNEE SURGERY    . LEEP    . TUMOR REMOVAL     from buttocks    There were no vitals filed for this visit.      Subjective Assessment - 04/07/17 0810    Subjective Pt reports she did fine after last session; no symptoms. She brought her TENS unit in for education. She can now go shopping without pain, no need to grab shopping scooter    Patient Stated Goals improve pain, be comfortable walking   Currently in Pain? No/denies   Pain Score 0-No pain            [facility] PT Assessment - 04/07/17 0001      Assessment   Medical Diagnosis Rt knee pain   Referring Provider Dr. Dianah Field   Next MD Visit 05/25/17          [facility] Adult PT Treatment/Exercise - 04/07/17 0001      Self-Care   Self-Care Other Self-Care Comments   Other Self-Care Comments  Pt educated on safety and application of TENS unit (parameters, settings, etc).  Pt verbalized understanding and returned demo of electrode  placement, machine operation.      Knee/Hip Exercises: Stretches   Passive Hamstring Stretch Right;Left;2 reps;30 seconds   Quad Stretch Right;Left;3 reps;30 seconds   Gastroc Stretch Right;Left;3 reps;30 seconds   Other Knee/Hip Stretches ITB cross body with strap 30 sec, 3 reps each leg.      Knee/Hip Exercises: Aerobic   Recumbent Bike L2: 5.5 min      Knee/Hip Exercises: Standing   SLS Rt/Lt SLS on blue pad x 30 sec x 2 reps each.    Other Standing Knee Exercises tandem stance on blue pads with horiz head turns x 30 sec x 2 reps each.   sustained heel raise with bilat feet onblue pads x 15 sec x 2 reps.                 PT Education - 04/07/17 [phone]    Education provided Yes   Education Details TENS info., HEP   Person(s) Educated Patient   Methods Explanation;Handout;Demonstration;Verbal cues   Comprehension Verbalized understanding;Returned demonstration             PT Long Term Goals - 04/07/17 0844      PT LONG TERM GOAL #1   Title independent with HEP (04/21/17)   Time 6   Period Weeks  Status Achieved     PT LONG TERM GOAL #2   Title perform Rt knee ROM without pain for improved function (04/21/17)   Time 6   Period Weeks   Status Achieved     PT LONG TERM GOAL #3   Title report ability to walk > 45 min without increase in pain for improved functional mobility (04/21/17)   Time 6   Period Weeks   Status Achieved     PT LONG TERM GOAL #4   Title demonstrate at least 4/5 in RLE for improved function (04/21/17)   Time 6   Period Weeks   Status Achieved               Plan - 04/07/17 1002    Clinical Impression Statement Pt tolerated all exercises well, without any symptoms.  She was able to verbalize understanding of application/use of TENS unit.  She has met all LTGs and requests to d/c to HEP at this time.    Rehab Potential Good   PT Frequency 2x / week   PT Duration 6 weeks   PT Treatment/Interventions ADLs/Self Care Home  Management;Cryotherapy;Electrical Stimulation;Iontophoresis 47m/ml Dexamethasone;Ultrasound;Moist Heat;Neuromuscular re-education;Therapeutic exercise;Functional mobility training;Therapeutic activities;Stair training;Gait training;Patient/family education;Manual techniques;Passive range of motion;Dry needling;Taping;Vasopneumatic Device   PT Next Visit Plan Spoke to supervising PT; will d/c to HEP.    Consulted and Agree with Plan of Care Patient      Patient will benefit from skilled therapeutic intervention in order to improve the following deficits and impairments:  Difficulty walking, Decreased strength, Impaired flexibility, Pain, Increased edema  Visit Diagnosis: Muscle weakness (generalized)  Acute pain of right knee     Problem List Patient Active Problem List   Diagnosis Date Noted  . Primary osteoarthritis of right knee 03/02/2017  . Fatty liver disease, nonalcoholic 148/59/2763 . Vitamin D deficiency 07/20/2016  . HYPERTRIGLYCERIDEMIA 03/08/2010  . DIZZINESS 02/11/2010   JKerin Perna PTA 04/07/17 10:07 AM  [place] [address]Phone: [phone]  Fax:  [phone] Name: Laura BrociousMRN: [id number] of Birth: [birth year] PHYSICAL THERAPY DISCHARGE SUMMARY  Visits from Start of Care: 9  Current functional level related to goals / functional outcomes: See above   Remaining deficits: none   Education / Equipment: HEP Plan: Patient agrees to discharge.  Patient goals were met. Patient is being discharged due to meeting the stated rehab goals.  ?????    SJeral Pinch PT 05/17/17 7:41 AM

## 2017-04-10 ENCOUNTER — Encounter: Payer: BLUE CROSS/BLUE SHIELD | Admitting: Physical Therapy

## 2017-04-13 ENCOUNTER — Encounter: Payer: BLUE CROSS/BLUE SHIELD | Admitting: Physical Therapy

## 2017-04-17 ENCOUNTER — Encounter: Payer: BLUE CROSS/BLUE SHIELD | Admitting: Physical Therapy

## 2017-04-26 DIAGNOSIS — Z01419 Encounter for gynecological examination (general) (routine) without abnormal findings: Secondary | ICD-10-CM | POA: Diagnosis not present

## 2017-04-26 DIAGNOSIS — Z1231 Encounter for screening mammogram for malignant neoplasm of breast: Secondary | ICD-10-CM | POA: Diagnosis not present

## 2017-04-26 DIAGNOSIS — Z6832 Body mass index (BMI) 32.0-32.9, adult: Secondary | ICD-10-CM | POA: Diagnosis not present

## 2017-04-27 ENCOUNTER — Other Ambulatory Visit: Payer: Self-pay | Admitting: Obstetrics and Gynecology

## 2017-04-27 DIAGNOSIS — N644 Mastodynia: Secondary | ICD-10-CM

## 2017-05-01 ENCOUNTER — Ambulatory Visit
Admission: RE | Admit: 2017-05-01 | Discharge: 2017-05-01 | Disposition: A | Discharge status: Home / Self Care | Payer: BLUE CROSS/BLUE SHIELD | Source: Ambulatory Visit | Attending: Obstetrics and Gynecology | Admitting: Obstetrics and Gynecology

## 2017-05-01 DIAGNOSIS — N644 Mastodynia: Secondary | ICD-10-CM

## 2017-05-10 ENCOUNTER — Encounter: Payer: Self-pay | Admitting: Family Medicine

## 2017-05-10 ENCOUNTER — Ambulatory Visit (INDEPENDENT_AMBULATORY_CARE_PROVIDER_SITE_OTHER): Payer: BLUE CROSS/BLUE SHIELD | Admitting: Family Medicine

## 2017-05-10 VITALS — BP 126/72 | HR 72 | Wt 190.0 lb

## 2017-05-10 DIAGNOSIS — R42 Dizziness and giddiness: Secondary | ICD-10-CM

## 2017-05-10 DIAGNOSIS — R2689 Other abnormalities of gait and mobility: Secondary | ICD-10-CM | POA: Diagnosis not present

## 2017-05-10 DIAGNOSIS — R002 Palpitations: Secondary | ICD-10-CM | POA: Diagnosis not present

## 2017-05-10 LAB — CBC
HEMATOCRIT: 39.2 % (ref 35.0–45.0)
HEMOGLOBIN: 13 g/dL (ref 11.7–15.5)
MCH: 30.5 pg (ref 27.0–33.0)
MCHC: 33.2 g/dL (ref 32.0–36.0)
MCV: 92 fL (ref 80.0–100.0)
MPV: 10.8 fL (ref 7.5–12.5)
PLATELETS: 202 10*3/uL (ref 140–400)
RBC: 4.26 MIL/uL (ref 3.80–5.10)
RDW: 13 % (ref 11.0–15.0)
WBC: 5.4 10*3/uL (ref 3.8–10.8)

## 2017-05-10 NOTE — Addendum Note (Signed)
Addended by: Huel Cote on: 05/10/2017 04:48 PM   Modules accepted: Orders

## 2017-05-10 NOTE — Progress Notes (Signed)
Laura Johnston is a 46 y.o. female who presents to [place] [place] today for lightheadedness dizziness and palpitations. Symptoms have been ongoing for around a month but have significantly worsened over the past 3 days. Patient has episodes of tachycardia palpitations associated with lightheadedness and feeling flushed. She denies any syncope or chest pain or shortness of breath. She notes that her foot. Has recorded her heart rate during these episodes. She has several minutes of tachycardia into the 130s or 140s when she is at rest. She denies any vomiting or diarrhea. Additionally she denies any new medications. She has not tried any treatments yet.   Past Medical History:  Diagnosis Date  . Abnormal Pap smear   . AMA (advanced maternal age) multigravida 54+   . GERD (gastroesophageal reflux disease)   . Gestational diabetes   . Vaginal delivery 08/19/2011   Past Surgical History:  Procedure Laterality Date  . APPENDECTOMY    . KNEE SURGERY    . LEEP    . TUMOR REMOVAL     from buttocks   Social History  Substance Use Topics  . Smoking status: Former Research scientist (life sciences)  . Smokeless tobacco: Never Used  . Alcohol use No   family history includes Diabetes in her maternal uncle; Heart disease in her father; Hyperlipidemia in her father; Hypertension in her father; Stroke in her father and paternal grandmother.  ROS as above:  Medications: Current Outpatient Prescriptions  Medication Sig Dispense Refill  . diclofenac (VOLTAREN) 75 MG EC tablet Take 1 tablet (75 mg total) by mouth 2 (two) times daily. 60 tablet 3   No current facility-administered medications for this visit.    Facility-Administered Medications Ordered in Other Visits  Medication Dose Route Frequency Provider Last Rate Last Dose  . lactated ringers infusion   Intravenous Continuous Josephine Igo, MD  125 mL/hr at 08/21/11 1235     Allergies  Allergen Reactions  . Hydrocortisone   . Meloxicam     Paralysis  . Prednisone     Bilateral legs swollen.  . Pseudoephedrine     REACTION: numbness to fingers/tongue    Health Maintenance Health Maintenance  Topic Date Due  . INFLUENZA VACCINE  06/28/2017  . PAP SMEAR  03/17/2018  . TETANUS/TDAP  08/19/2021  . HIV Screening  Completed     Exam:  BP 126/72   Pulse 72   Wt 190 lb (86.2 kg)   LMP 10/25/2011   BMI 32.61 kg/m  Gen: Well NAD HEENT: EOMI,  MMM No goiter. Normal tympanic membranes. No cervical lymphadenopathy Lungs: Normal work of breathing. CTABL Heart: RRR no MRG Abd: NABS, Soft. Nondistended, Nontender Exts: Brisk capillary refill, warm and well perfused.  Neuro: Alert and oriented. Cranial nerves II through XII are intact. Normal coordination. Normal strength and sensation. Normal cerebellar testing. Abnormal Romberg test Normal gait.  Twelve-lead EKG shows normal sinus rhythm at 66 bpm. No ST segment elevation or depression. Normal T waves. Incomplete right bundle-branch possible. EKG not significantly changed from prior EKG in 2014    No results found for this or any previous visit (from the past 72 hour(s)). No results found.    Assessment and Plan: 46 y.o. female with tachycardia palpitations associated with lightheadedness.  This is concerning. Patient has a fitted device which is reporting episodes of tachycardia without exercise. This is concerning for SVT or other serious issues. She has not had any syncopal episodes nor had  any sustained tachycardia. I think is reasonable to proceed with an outpatient workup as her EKG is essentially unchanged from 4 years ago. Will additionally arrange for Holter monitor and laboratory workup listed below. Recheck next week. Precautions reviewed including not swimming or exercising.   Orders Placed This Encounter  Procedures  . CBC  . COMPLETE METABOLIC  PANEL WITH GFR  . TSH  . T4, free  . T3, free  . Magnesium  . Vitamin B12  . Holter monitor - 48 hour    Standing Status:   Future    Standing Expiration Date:   05/11/2027   No orders of the defined types were placed in this encounter.    Discussed warning signs or symptoms. Please see discharge instructions. Patient expresses understanding.  I spent 40 minutes with this patient, greater than 50% was face-to-face time counseling regarding the above diagnosis.

## 2017-05-10 NOTE — Patient Instructions (Signed)
Thank you for coming in today. We will do labs today.  We will also arrange for a hear monitor at home in the next few days.  Please follow up with me or Dr Madilyn Fireman next week.  Go to the emergency room if you worsen.  Do not swim or exercise until we figure this out.   Call or go to the emergency room if you get worse, have trouble breathing, have chest pains, or palpitations.    Palpitations A palpitation is the feeling that your heartbeat is irregular or is faster than normal. It may feel like your heart is fluttering or skipping a beat. Palpitations are usually not a serious problem. They may be caused by many things, including smoking, caffeine, alcohol, stress, and certain medicines. Although most causes of palpitations are not serious, palpitations can be a sign of a serious medical problem. In some cases, you may need further medical evaluation. Follow these instructions at home: Pay attention to any changes in your symptoms. Take these actions to help with your condition:  Avoid the following: ? Caffeinated coffee, tea, soft drinks, diet pills, and energy drinks. ? Chocolate. ? Alcohol.  Do not use any tobacco products, such as cigarettes, chewing tobacco, and e-cigarettes. If you need help quitting, ask your health care provider.  Try to reduce your stress and anxiety. Things that can help you relax include: ? Yoga. ? Meditation. ? Physical activity, such as swimming, jogging, or walking. ? Biofeedback. This is a method that helps you learn to use your mind to control things in your body, such as your heartbeats.  Get plenty of rest and sleep.  Take over-the-counter and prescription medicines only as told by your health care provider.  Keep all follow-up visits as told by your health care provider. This is important.  Contact a health care provider if:  You continue to have a fast or irregular heartbeat after 24 hours.  Your palpitations occur more often. Get help  right away if:  You have chest pain or shortness of breath.  You have a severe headache.  You feel dizzy or you faint. This information is not intended to replace advice given to you by your health care provider. Make sure you discuss any questions you have with your health care provider. Document Released: 11/11/2000 Document Revised: 04/18/2016 Document Reviewed: 07/30/2015 Elsevier Interactive Patient Education  2017 [employer].   Holter Monitoring A Holter monitor is a small device that is used to detect abnormal heart rhythms. It clips to your clothing and is connected by wires to flat, sticky disks (electrodes) that attach to your chest. It is worn continuously for 24-48 hours. Follow these instructions at home:  Wear your Holter monitor at all times, even while exercising and sleeping, for as long as directed by your health care provider.  Make sure that the Holter monitor is safely clipped to your clothing or close to your body as recommended by your health care provider.  Do not get the monitor or wires wet.  Do not put body lotion or moisturizer on your chest.  Keep your skin clean.  Keep a diary of your daily activities, such as walking and doing chores. If you feel that your heartbeat is abnormal or that your heart is fluttering or skipping a beat: ? Record what you are doing when it happens. ? Record what time of day the symptoms occur.  Return your Holter monitor as directed by your health care provider.  Keep all  follow-up visits as directed by your health care provider. This is important. Get help right away if:  You feel lightheaded or you faint.  You have trouble breathing.  You feel pain in your chest, upper arm, or jaw.  You feel sick to your stomach and your skin is pale, cool, or damp.  You heartbeat feels unusual or abnormal. This information is not intended to replace advice given to you by your health care provider. Make sure you discuss any  questions you have with your health care provider. Document Released: 08/12/2004 Document Revised: 04/21/2016 Document Reviewed: 06/23/2014 Elsevier Interactive Patient Education  [employer].

## 2017-05-11 LAB — VITAMIN B12: Vitamin B-12: 332 pg/mL (ref 200–1100)

## 2017-05-11 LAB — COMPLETE METABOLIC PANEL WITH GFR
ALBUMIN: 4.2 g/dL (ref 3.6–5.1)
ALK PHOS: 54 U/L (ref 33–115)
ALT: 57 U/L — ABNORMAL HIGH (ref 6–29)
AST: 37 U/L — ABNORMAL HIGH (ref 10–35)
BUN: 12 mg/dL (ref 7–25)
CALCIUM: 9.1 mg/dL (ref 8.6–10.2)
CO2: 24 mmol/L (ref 20–31)
Chloride: 103 mmol/L (ref 98–110)
Creat: 0.58 mg/dL (ref 0.50–1.10)
Glucose, Bld: 118 mg/dL — ABNORMAL HIGH (ref 65–99)
Potassium: 4.1 mmol/L (ref 3.5–5.3)
Sodium: 138 mmol/L (ref 135–146)
TOTAL PROTEIN: 6.8 g/dL (ref 6.1–8.1)
Total Bilirubin: 0.4 mg/dL (ref 0.2–1.2)

## 2017-05-11 LAB — TSH: TSH: 0.64 m[IU]/L

## 2017-05-11 LAB — MAGNESIUM: Magnesium: 2.1 mg/dL (ref 1.5–2.5)

## 2017-05-11 LAB — T3, FREE: T3 FREE: 3.2 pg/mL (ref 2.3–4.2)

## 2017-05-11 LAB — T4, FREE: FREE T4: 1.2 ng/dL (ref 0.8–1.8)

## 2017-05-17 ENCOUNTER — Telehealth: Payer: Self-pay | Admitting: Family Medicine

## 2017-05-17 ENCOUNTER — Ambulatory Visit (INDEPENDENT_AMBULATORY_CARE_PROVIDER_SITE_OTHER): Payer: BLUE CROSS/BLUE SHIELD

## 2017-05-17 ENCOUNTER — Ambulatory Visit (INDEPENDENT_AMBULATORY_CARE_PROVIDER_SITE_OTHER): Payer: BLUE CROSS/BLUE SHIELD | Admitting: Family Medicine

## 2017-05-17 ENCOUNTER — Encounter: Payer: Self-pay | Admitting: Family Medicine

## 2017-05-17 DIAGNOSIS — R002 Palpitations: Secondary | ICD-10-CM

## 2017-05-17 DIAGNOSIS — J9811 Atelectasis: Secondary | ICD-10-CM | POA: Diagnosis not present

## 2017-05-17 MED ORDER — LANCETS MISC: 0 refills | Status: DC

## 2017-05-17 NOTE — Telephone Encounter (Signed)
Dr. Georgina Snell    While giving patient her appointment for her Echo she asked if there was any way you could give her a call today concerning her x ray. She has some questions and concerns and is real nervous about the results. - CF

## 2017-05-17 NOTE — Telephone Encounter (Signed)
I called pt back about her CXR results.

## 2017-05-17 NOTE — Progress Notes (Signed)
Laura Johnston is a 46 y.o. female who presents to [facility] today for heart palpitations. Patient was seen about a week ago for tachypalpitations associated with lightheadedness and dizziness. She seizures occur typically after she eats. She denies any chest pain or exertional chest pain. She denies any syncopal episodes. She has recorded a few episodes of tachycardia with a fitbit watch well she was resting.  Last week her EKG was unremarkable and she had labs which are also unremarkable. A Holter monitor was ordered which will be done in one week from today. She notes symptoms persist and are quite bothersome. She notes a personal history of gestational diabetes and notes that she has a glucose monitor for when she was pregnant. She thinks she has run out of lancets.   Past Medical History:  Diagnosis Date  . Abnormal Pap smear   . AMA (advanced maternal age) multigravida 44+   . GERD (gastroesophageal reflux disease)   . Gestational diabetes   . Vaginal delivery 08/19/2011   Past Surgical History:  Procedure Laterality Date  . APPENDECTOMY    . KNEE SURGERY    . LEEP    . TUMOR REMOVAL     from buttocks   Social History  Substance Use Topics  . Smoking status: Former Research scientist (life sciences)  . Smokeless tobacco: Never Used  . Alcohol use No   family history includes Diabetes in her maternal uncle; Heart disease in her father; Hyperlipidemia in her father; Hypertension in her father; Stroke in her father and paternal grandmother.  ROS as above:  Medications: Current Outpatient Prescriptions  Medication Sig Dispense Refill  . diclofenac (VOLTAREN) 75 MG EC tablet Take 1 tablet (75 mg total) by mouth 2 (two) times daily. 60 tablet 3  . Lancets MISC Lancets. Check blood sugar when feeling bad up to 3x daily 100 each 0   No current facility-administered medications for  this visit.    Allergies  Allergen Reactions  . Hydrocortisone   . Meloxicam     Paralysis  . Prednisone     Bilateral legs swollen.  . Pseudoephedrine     REACTION: numbness to fingers/tongue    Health Maintenance Health Maintenance  Topic Date Due  . INFLUENZA VACCINE  06/28/2017  . PAP SMEAR  03/17/2018  . TETANUS/TDAP  08/19/2021  . HIV Screening  Completed     Exam:  BP 112/71   Pulse 67   Wt 188 lb (85.3 kg)   LMP 10/25/2011   BMI 32.27 kg/m  Gen: Well NAD HEENT: EOMI,  MMM Lungs: Normal work of breathing. CTABL Heart: RRR no MRG Abd: NABS, Soft. Nondistended, Nontender Exts: Brisk capillary refill, warm and well perfused.     Chemistry      Component Value Date/Time   NA 138 05/10/2017 1448   NA 138 03/22/2016   K 4.1 05/10/2017 1448   CL 103 05/10/2017 1448   CO2 24 05/10/2017 1448   BUN 12 05/10/2017 1448   BUN 11 03/22/2016   CREATININE 0.58 05/10/2017 1448   GLU 93 03/22/2016      Component Value Date/Time   CALCIUM 9.1 05/10/2017 1448   ALKPHOS 54 05/10/2017 1448   AST 37 (H) 05/10/2017 1448   ALT 57 (H) 05/10/2017 1448   BILITOT 0.4 05/10/2017 1448     Lab Results  Component Value Date   TSH 0.64 05/10/2017   Lab Results  Component Value Date  WBC 5.4 05/10/2017   HGB 13.0 05/10/2017   HCT 39.2 05/10/2017   MCV 92.0 05/10/2017   PLT 202 05/10/2017      No results found for this or any previous visit (from the past 72 hour(s)). Dg Chest 2 View  Result Date: 05/17/2017 CLINICAL DATA:  Four day history of cardiac palpitations EXAM: CHEST  2 VIEW COMPARISON:  None. FINDINGS: There is atelectatic change in the right middle lobe. Lungs elsewhere clear. Heart size and pulmonary vascularity are normal. No adenopathy. No pneumothorax. No bone lesions. IMPRESSION: Areas of atelectatic change in the right middle lobe. No edema or consolidation. Electronically Signed   By: Lowella Grip III M.D.   On: 05/17/2017 08:57       Assessment and Plan: 46 y.o. female with  Tachycardia palpitations. Unclear etiology. Plan for Holter monitor in 1 week. We'll try to get this moved up if possible. Additionally we'll order echocardiogram and refer to cardiology. I think she'll probably need a stress test.  Additionally we'll start looking for other explanations. I recommend she check her blood sugar and episodes when she feels poorly. She may be having episodes of hypoglycemia.  We discussed in detail precautions that would prompt an emergency room visit.  Recheck in 2 weeks or sooner PRN.    Orders Placed This Encounter  Procedures  . DG Chest 2 View    Order Specific Question:   Reason for exam:    Answer:   tachypalpitations    Order Specific Question:   Is the patient pregnant?    Answer:   No    Order Specific Question:   Preferred imaging location?    Answer:   [facility]  . Ambulatory referral to Cardiology    Referral Priority:   Routine    Referral Type:   Consultation    Referral Reason:   Specialty Services Required    Requested Specialty:   Cardiology    Number of Visits Requested:   1  . ECHOCARDIOGRAM COMPLETE    Standing Status:   Future    Standing Expiration Date:   08/17/2018    Order Specific Question:   Where should this test be performed    Answer:   [facility]    Order Specific Question:   Perflutren DEFINITY (image enhancing agent) should be administered unless hypersensitivity or allergy exist    Answer:   Administer Perflutren    Order Specific Question:   Expected Date:    Answer:   1 week   Meds ordered this encounter  Medications  . Lancets MISC    Sig: Lancets. Check blood sugar when feeling bad up to 3x daily    Dispense:  100 each    Refill:  0     Discussed warning signs or symptoms. Please see discharge instructions. Patient expresses understanding.

## 2017-05-17 NOTE — Patient Instructions (Addendum)
Thank you for coming in today. You should hear about the ECHO scheduling soon.  The ECHO is a heart ultrasound.  I am also referring you to a heart doctor.  Let me know if you do not hear anything soon or the appointment is not in the next few weeks.  We are trying to get you a sooner appointment for the heart monitor.   If you worsen or the fast heart rate does not get better go to the emergency room.   Recheck in 2 weeks or sooner if needed.    Palpitations A palpitation is the feeling that your heartbeat is irregular or is faster than normal. It may feel like your heart is fluttering or skipping a beat. Palpitations are usually not a serious problem. They may be caused by many things, including smoking, caffeine, alcohol, stress, and certain medicines. Although most causes of palpitations are not serious, palpitations can be a sign of a serious medical problem. In some cases, you may need further medical evaluation. Follow these instructions at home: Pay attention to any changes in your symptoms. Take these actions to help with your condition:  Avoid the following: ? Caffeinated coffee, tea, soft drinks, diet pills, and energy drinks. ? Chocolate. ? Alcohol.  Do not use any tobacco products, such as cigarettes, chewing tobacco, and e-cigarettes. If you need help quitting, ask your health care provider.  Try to reduce your stress and anxiety. Things that can help you relax include: ? Yoga. ? Meditation. ? Physical activity, such as swimming, jogging, or walking. ? Biofeedback. This is a method that helps you learn to use your mind to control things in your body, such as your heartbeats.  Get plenty of rest and sleep.  Take over-the-counter and prescription medicines only as told by your health care provider.  Keep all follow-up visits as told by your health care provider. This is important.  Contact a health care provider if:  You continue to have a fast or irregular heartbeat  after 24 hours.  Your palpitations occur more often. Get help right away if:  You have chest pain or shortness of breath.  You have a severe headache.  You feel dizzy or you faint. This information is not intended to replace advice given to you by your health care provider. Make sure you discuss any questions you have with your health care provider. Document Released: 11/11/2000 Document Revised: 04/18/2016 Document Reviewed: 07/30/2015 Elsevier Interactive Patient Education  2017 [employer].

## 2017-05-19 ENCOUNTER — Telehealth: Payer: Self-pay

## 2017-05-19 MED ORDER — DICLOFENAC SODIUM 1 % TD GEL: 4.0000 g | Freq: Four times a day (QID) | TRANSDERMAL | 11 refills | Status: DC

## 2017-05-19 NOTE — Telephone Encounter (Signed)
Topical diclofenac gel would be safer than oral diclofenac pills. I sent a prescription for diclofenac gel. Use this instead of the oral diclofenac pills.

## 2017-05-19 NOTE — Telephone Encounter (Signed)
Laura Johnston called and states she is concerned about taking the diclofenac for her knee pain now that she is having these cardiac symptoms. Please advise if patient can continue taking the diclofenac.

## 2017-05-22 NOTE — Telephone Encounter (Signed)
Patient advised of recommendations.  

## 2017-05-23 ENCOUNTER — Other Ambulatory Visit: Payer: Self-pay | Admitting: Family Medicine

## 2017-05-23 ENCOUNTER — Ambulatory Visit (HOSPITAL_COMMUNITY)
Admission: RE | Admit: 2017-05-23 | Discharge: 2017-05-23 | Disposition: A | Discharge status: Home / Self Care | Payer: BLUE CROSS/BLUE SHIELD | Source: Ambulatory Visit | Attending: Family Medicine | Admitting: Family Medicine

## 2017-05-23 ENCOUNTER — Telehealth: Payer: Self-pay

## 2017-05-23 DIAGNOSIS — R002 Palpitations: Secondary | ICD-10-CM

## 2017-05-23 NOTE — Progress Notes (Signed)
  Echocardiogram 2D Echocardiogram has been performed.  Darlina Sicilian M 05/23/2017, 10:54 AM

## 2017-05-23 NOTE — Telephone Encounter (Signed)
PA approved for Diclofenac Gel   CaseId:[id number];Status:Approved;Review Type:Prior Auth;Coverage Start Date:04/23/2017;Coverage End Date:05/23/2018;

## 2017-05-24 ENCOUNTER — Ambulatory Visit (INDEPENDENT_AMBULATORY_CARE_PROVIDER_SITE_OTHER): Payer: BLUE CROSS/BLUE SHIELD

## 2017-05-24 DIAGNOSIS — R002 Palpitations: Secondary | ICD-10-CM

## 2017-05-25 ENCOUNTER — Ambulatory Visit (INDEPENDENT_AMBULATORY_CARE_PROVIDER_SITE_OTHER): Payer: BLUE CROSS/BLUE SHIELD | Admitting: Sports Medicine

## 2017-05-25 DIAGNOSIS — M1711 Unilateral primary osteoarthritis, right knee: Secondary | ICD-10-CM

## 2017-05-25 NOTE — Progress Notes (Signed)
  Subjective:    CC: Follow-up  HPI: Right knee osteoarthritis: Injection was 3 months ago, had a recollection of fluid, pain. Moderate, persistent, we have not yet done Visco supplementation.  Past medical history:  Negative.  See flowsheet/record as well for more information.  Surgical history: Negative.  See flowsheet/record as well for more information.  Family history: Negative.  See flowsheet/record as well for more information.  Social history: Negative.  See flowsheet/record as well for more information.  Allergies, and medications have been entered into the medical record, reviewed, and no changes needed.   Review of Systems: No fevers, chills, night sweats, weight loss, chest pain, or shortness of breath.   Objective:    General: Well Developed, well nourished, and in no acute distress.  Neuro: Alert and oriented x3, extra-ocular muscles intact, sensation grossly intact.  HEENT: Normocephalic, atraumatic, pupils equal round reactive to light, neck supple, no masses, no lymphadenopathy, thyroid nonpalpable.  Skin: Warm and dry, no rashes. Cardiac: Regular rate and rhythm, no murmurs rubs or gallops, no lower extremity edema.  Respiratory: Clear to auscultation bilaterally. Not using accessory muscles, speaking in full sentences. Right Knee: Visible swollen with anterior and medial joint line pain, palpable fluid wave. ROM normal in flexion and extension and lower leg rotation. Ligaments with solid consistent endpoints including ACL, PCL, LCL, MCL. Negative Mcmurray's and provocative meniscal tests. Non painful patellar compression. Patellar and quadriceps tendons unremarkable. Hamstring and quadriceps strength is normal.  Procedure: Real-time Ultrasound Guided aspiration/injection of right knee Device: GE Logiq E  Verbal informed consent obtained.  Time-out conducted.  Noted no overlying erythema, induration, or other signs of local infection.  Skin prepped in a sterile  fashion.  Local anesthesia: Topical Ethyl chloride.  With sterile technique and under real time ultrasound guidance:  Using 18-gauge needle aspirated approximately 20-30 mL straw-colored fluid, syringe switched and 1 mL Kenalog 40, 2 mL lidocaine, 2 mL bupivacaine injected easily. Completed without difficulty  Pain immediately resolved suggesting accurate placement of the medication.  Advised to call if fevers/chills, erythema, induration, drainage, or persistent bleeding.  Images permanently stored and available for review in the ultrasound unit.  Impression: Technically successful ultrasound guided injection.  Impression and Recommendations:    Primary osteoarthritis of right knee Recurrence of swelling and pain, repeat aspiration and injection, I am going to at this point get her set up for viscosupplementation. Return when Visco is approved to start shot series.  I spent 40 minutes with this patient, greater than 50% was face-to-face time counseling regarding the above diagnoses, this was separate from the time spent performing the procedure, I answered multiple questions regarding multiple other medical problems.

## 2017-05-25 NOTE — Assessment & Plan Note (Signed)
Recurrence of swelling and pain, repeat aspiration and injection, I am going to at this point get her set up for viscosupplementation. Return when Visco is approved to start shot series.

## 2017-05-29 ENCOUNTER — Ambulatory Visit: Payer: BLUE CROSS/BLUE SHIELD | Admitting: Family Medicine

## 2017-06-07 ENCOUNTER — Other Ambulatory Visit (HOSPITAL_BASED_OUTPATIENT_CLINIC_OR_DEPARTMENT_OTHER): Payer: BLUE CROSS/BLUE SHIELD

## 2017-06-07 ENCOUNTER — Telehealth: Payer: Self-pay | Admitting: *Deleted

## 2017-06-07 NOTE — Telephone Encounter (Signed)
-----   Message from Huel Cote, LPN sent at 07/02/276  1:50 PM EDT -----   ----- Message ----- From: Silverio Decamp, MD Sent: 05/25/2017   4:35 PM To: Huel Cote, LPN  Right knee Orthovisc approval

## 2017-06-07 NOTE — Telephone Encounter (Signed)
Order placed for orthovisc.Laura Johnston

## 2017-07-14 ENCOUNTER — Telehealth: Payer: Self-pay

## 2017-07-14 NOTE — Telephone Encounter (Signed)
Pt called stating that she was billed a no show charge on 05/29/2017 by Dr. Georgina Snell. Pt states that she was advised on 06/01/2017 that she no longer needed that appt and that it would be canceled when she received the results of her Holter monitor. She feels that she was incorrectly charged a no show fee and would like the charge removed. Advised pt that this information would be routed to the provider and office manager for review.

## 2017-07-14 NOTE — Telephone Encounter (Signed)
Okay to remove no-show.

## 2017-08-09 DIAGNOSIS — H5203 Hypermetropia, bilateral: Secondary | ICD-10-CM | POA: Diagnosis not present

## 2017-10-31 ENCOUNTER — Encounter: Payer: Self-pay | Admitting: Sports Medicine

## 2017-10-31 ENCOUNTER — Ambulatory Visit: Payer: BLUE CROSS/BLUE SHIELD | Admitting: Sports Medicine

## 2017-10-31 DIAGNOSIS — M1711 Unilateral primary osteoarthritis, right knee: Secondary | ICD-10-CM

## 2017-10-31 NOTE — Assessment & Plan Note (Signed)
Repeat aspiration and injection, persistent pain, she has been on NSAIDs, x-ray confirmed arthritis. I am also going to get her approved for Monovisc.

## 2017-10-31 NOTE — Progress Notes (Signed)
  Subjective:    CC: Right knee pain  HPI: This is a pleasant 46 year old female, she has known right knee osteoarthritis, her previous injection and aspiration was 6 months ago, now having recurrence of swelling, pain, localized mostly at the anterolateral joint line with difficulty flexing the knee.  No mechanical symptoms, no trauma.  Moderate, persistent without radiation.  Past medical history:  Negative.  See flowsheet/record as well for more information.  Surgical history: Negative.  See flowsheet/record as well for more information.  Family history: Negative.  See flowsheet/record as well for more information.  Social history: Negative.  See flowsheet/record as well for more information.  Allergies, and medications have been entered into the medical record, reviewed, and no changes needed.   Review of Systems: No fevers, chills, night sweats, weight loss, chest pain, or shortness of breath.   Objective:    General: Well Developed, well nourished, and in no acute distress.  Neuro: Alert and oriented x3, extra-ocular muscles intact, sensation grossly intact.  HEENT: Normocephalic, atraumatic, pupils equal round reactive to light, neck supple, no masses, no lymphadenopathy, thyroid nonpalpable.  Skin: Warm and dry, no rashes. Cardiac: Regular rate and rhythm, no murmurs rubs or gallops, no lower extremity edema.  Respiratory: Clear to auscultation bilaterally. Not using accessory muscles, speaking in full sentences. Right knee: Visibly swollen, palpable fluid wave, tender to palpation over the anterolateral joint line ROM normal in flexion and extension and lower leg rotation. Ligaments with solid consistent endpoints including ACL, PCL, LCL, MCL. Negative Mcmurray's and provocative meniscal tests. Non painful patellar compression. Patellar and quadriceps tendons unremarkable. Hamstring and quadriceps strength is normal.  Procedure: Real-time Ultrasound Guided  aspiration/injection of right knee Device: GE Logiq E  Verbal informed consent obtained.  Time-out conducted.  Noted no overlying erythema, induration, or other signs of local infection.  Skin prepped in a sterile fashion.  Local anesthesia: Topical Ethyl chloride.  With sterile technique and under real time ultrasound guidance: Aspirated liters of straw-colored fluid, syringe switched in 1 cc kenalog 40, 2 cc lidocaine, 2 cc bupivacaine injected easily Completed without difficulty  Pain immediately resolved suggesting accurate placement of the medication.  Advised to call if fevers/chills, erythema, induration, drainage, or persistent bleeding.  Images permanently stored and available for review in the ultrasound unit.  Impression: Technically successful ultrasound guided injection.  Impression and Recommendations:    Primary osteoarthritis of right knee Repeat aspiration and injection, persistent pain, she has been on NSAIDs, x-ray confirmed arthritis. I am also going to get her approved for Monovisc.  ___________________________________________ Gwen Her. Dianah Field, M.D., ABFM., CAQSM. Primary Care and [place] [employer] [facility]

## 2017-11-10 ENCOUNTER — Telehealth: Payer: Self-pay | Admitting: Family Medicine

## 2017-11-10 NOTE — Telephone Encounter (Signed)
OrthoVisc was last mentioned in July of 2018. Will have to resubmit.

## 2017-11-10 NOTE — Telephone Encounter (Addendum)
Pt called and spoke with Keenan Bachelor.  She's wanting to know status of monovisc inj. KElSI, IS ORTHOVISC AND MONOVISC THE SAME INJ? BECAUSE DR MADE MENTION OF GETTING [facility] IN HIS 12/418  NOTE. Pt called.  She wants to know if inj was resubmitted? I told her I will call her back at 11:00 because she said she left multiple messages for you.

## 2017-11-13 ENCOUNTER — Telehealth: Payer: Self-pay | Admitting: Sports Medicine

## 2017-11-13 NOTE — Telephone Encounter (Signed)
I think Laura Johnston is working on it now.

## 2017-11-13 NOTE — Telephone Encounter (Signed)
Submitted for approval on Monovisc. Awaiting confirmation.

## 2017-11-13 NOTE — Telephone Encounter (Signed)
Pt calls and wants to know if the Monovisc injections have been approved, she hasnt heard anything in regards to it. KG LPN

## 2017-11-15 ENCOUNTER — Encounter: Payer: Self-pay | Admitting: Sports Medicine

## 2017-11-15 ENCOUNTER — Telehealth: Payer: Self-pay | Admitting: Sports Medicine

## 2017-11-15 NOTE — Telephone Encounter (Signed)
Pt has been advised, see other phone note.

## 2017-11-15 NOTE — Telephone Encounter (Signed)
Pt called regarding the Gel injection needed before Jan 2019 .Pt stated request the medication on Dec4 but have not heard from anyone. Pt talk to the nurse(Kelsey) regarding Prior authorization sent/faxed paperwork today and would take 48 hour feedback from insurance..the patient requesting for Dr T to call her back

## 2017-11-15 NOTE — Telephone Encounter (Signed)
Received the following information from [employer] benefits investigation:   Monovisc is covered. All services associated with the office visit, injection and product are covered under one copay of $55.00. The deductible does not apply. Once the out of pocket is met, the patient will have no financial responsibility. The pharmacy option is available, but is carved out through the pharmacy benefit. See the pharmacy benefits for next steps. Call reference number is [id number]. Please initiate the prior authorization by calling Utilization Management at [phone]. The prescription needs to be triaged first through the pharmacy benefit. If denied by the pharmacy plan, please send the denial letter to the MyVisco Program. We will then triage the prescription to the [facility].  Called insurance to initiated PA, spoke with [place]. Unable to complete PA on the phone, will complete PA form and fax in. Pt advised of status update.

## 2017-11-16 NOTE — Telephone Encounter (Signed)
Scheduled pt for 11/17/17

## 2017-11-16 NOTE — Telephone Encounter (Signed)
Monovisc is covered, have her set up an appointment tomorrow ASAP for the injection.  I would just speak to her tomorrow in person.

## 2017-11-17 ENCOUNTER — Encounter: Payer: Self-pay | Admitting: Sports Medicine

## 2017-11-17 ENCOUNTER — Ambulatory Visit (INDEPENDENT_AMBULATORY_CARE_PROVIDER_SITE_OTHER): Payer: BLUE CROSS/BLUE SHIELD | Admitting: Sports Medicine

## 2017-11-17 DIAGNOSIS — M1711 Unilateral primary osteoarthritis, right knee: Secondary | ICD-10-CM

## 2017-11-17 NOTE — Telephone Encounter (Signed)
Received denial for Monovisc. Information given to Provider.

## 2017-11-17 NOTE — Telephone Encounter (Signed)
Received fax today (11/17/17) that a PA on covermymeds was required. PA completed and sent to plan.

## 2017-11-17 NOTE — Assessment & Plan Note (Signed)
Monovisc injection, return in 1 month to evaluate response. Adding a reaction knee brace. I did obtain approval with the insurance company on the phone before the injection, approval [id number].

## 2017-11-17 NOTE — Progress Notes (Signed)
   Procedure: Real-time Ultrasound Guided Injection of right knee Device: GE Logiq E  Verbal informed consent obtained.  Time-out conducted.  Noted no overlying erythema, induration, or other signs of local infection.  Skin prepped in a sterile fashion.  Local anesthesia: Topical Ethyl chloride.  With sterile technique and under real time ultrasound guidance: 88 mg/4 mL of MonoVisc (sodium hyaluronate) in a prefilled syringe was injected easily into the knee through a 22-gauge needle. Completed without difficulty  Pain immediately resolved suggesting accurate placement of the medication.  Advised to call if fevers/chills, erythema, induration, drainage, or persistent bleeding.  Images permanently stored and available for review in the ultrasound unit.  Impression: Technically successful ultrasound guided injection. 

## 2017-11-23 ENCOUNTER — Ambulatory Visit: Payer: BLUE CROSS/BLUE SHIELD | Admitting: Family Medicine

## 2017-11-23 NOTE — Telephone Encounter (Signed)
Thank you :)

## 2017-12-05 DIAGNOSIS — J029 Acute pharyngitis, unspecified: Secondary | ICD-10-CM | POA: Diagnosis not present

## 2017-12-06 ENCOUNTER — Ambulatory Visit: Payer: BLUE CROSS/BLUE SHIELD | Admitting: Physician Assistant

## 2017-12-07 ENCOUNTER — Other Ambulatory Visit: Payer: Self-pay

## 2017-12-07 ENCOUNTER — Emergency Department
Admission: EM | Admit: 2017-12-07 | Discharge: 2017-12-07 | Disposition: A | Discharge status: Home / Self Care | Payer: BLUE CROSS/BLUE SHIELD | Source: Home / Self Care | Attending: Family Medicine | Admitting: Family Medicine

## 2017-12-07 ENCOUNTER — Encounter: Payer: Self-pay | Admitting: Emergency Medicine

## 2017-12-07 DIAGNOSIS — J111 Influenza due to unidentified influenza virus with other respiratory manifestations: Secondary | ICD-10-CM

## 2017-12-07 DIAGNOSIS — R69 Illness, unspecified: Secondary | ICD-10-CM | POA: Diagnosis not present

## 2017-12-07 MED ORDER — OSELTAMIVIR PHOSPHATE 75 MG PO CAPS: 75.0000 mg | ORAL_CAPSULE | Freq: Two times a day (BID) | ORAL | 0 refills | Status: DC

## 2017-12-07 NOTE — ED Provider Notes (Signed)
Laura Johnston CARE    CSN: [id number] Arrival date & time: 12/07/17  1356     History   Chief Complaint Chief Complaint  Patient presents with  . URI    HPI Laura Johnston is a 47 y.o. female.   Complains of 2 day history flu-like illness including myalgias, headache, chills/sweats, fatigue, and cough.  Also has mild nasal congestion and sore throat.  Cough started today and is non-productive.  No pleuritic pain or shortness of breath.  She had a negative strep test at a [facility].   The history is provided by the patient.    Past Medical History:  Diagnosis Date  . Abnormal Pap smear   . AMA (advanced maternal age) multigravida 80+   . GERD (gastroesophageal reflux disease)   . Gestational diabetes   . Vaginal delivery 08/19/2011    Patient Active Problem List   Diagnosis Date Noted  . Heart palpitations 05/17/2017  . Primary osteoarthritis of right knee 03/02/2017  . Fatty liver disease, nonalcoholic 37/08/6268  . Vitamin D deficiency 07/20/2016  . HYPERTRIGLYCERIDEMIA 03/08/2010  . DIZZINESS 02/11/2010    Past Surgical History:  Procedure Laterality Date  . APPENDECTOMY    . KNEE SURGERY    . LEEP    . TUMOR REMOVAL     from buttocks    OB History    Gravida Para Term Preterm AB Living   2 1 1  0 1 1   SAB TAB Ectopic Multiple Live Births   1 0 0 0 1       Home Medications    Prior to Admission medications   Medication Sig Start Date End Date Taking? Authorizing Provider  Chlorphen-Phenyleph-APAP (CORICIDIN D COLD/FLU/SINUS) 2-5-325 MG TABS Take by mouth.   Yes [provider]  oseltamivir (TAMIFLU) 75 MG capsule Take 1 capsule (75 mg total) by mouth every 12 (twelve) hours. 12/07/17   Kandra Nicolas, MD    Family History Family History  Problem Relation Age of Onset  . Stroke Father   . Hypertension Father   . Hyperlipidemia Father   . Heart disease Father   . Diabetes Maternal Uncle   . Stroke Paternal  Grandmother     Social History Social History   Tobacco Use  . Smoking status: Former Research scientist (life sciences)  . Smokeless tobacco: Never Used  Substance Use Topics  . Alcohol use: No  . Drug use: No     Allergies   Hydrocortisone; Meloxicam; Prednisone; and Pseudoephedrine   Review of Systems Review of Systems + sore throat + cough No pleuritic pain No wheezing + nasal congestion + post-nasal drainage No sinus pain/pressure No itchy/red eyes No earache No hemoptysis No SOB ? fever, + chills/sweats No nausea No vomiting No abdominal pain No diarrhea No urinary symptoms No skin rash + fatigue + myalgias + headache Used OTC meds without relief   Physical Exam Triage Vital Signs ED Triage Vitals  Enc Vitals Group     BP 12/07/17 1419 130/90     Pulse Rate 12/07/17 1419 (!) 122     Resp --      Temp 12/07/17 1419 98 F (36.7 C)     Temp Source 12/07/17 1419 Oral     SpO2 12/07/17 1419 98 %     Weight 12/07/17 1420 194 lb (88 kg)     Height 12/07/17 1420 5\' 4"  (1.626 m)     Head Circumference --      Peak Flow --  Pain Score 12/07/17 1421 0     Pain Loc --      Pain Edu? --      Excl. in [place]? --    No data found.  Updated Vital Signs BP 130/90 (BP Location: Right Arm)   Pulse (!) 122   Temp 98 F (36.7 C) (Oral)   Ht 5\' 4"  (1.626 m)   Wt 194 lb (88 kg)   LMP 10/25/2011   SpO2 98%   BMI 33.30 kg/m   Visual Acuity Right Eye Distance:   Left Eye Distance:   Bilateral Distance:    Right Eye Near:   Left Eye Near:    Bilateral Near:     Physical Exam Nursing notes and Vital Signs reviewed. Appearance:  Patient appears stated age, and in no acute distress Eyes:  Pupils are equal, round, and reactive to light and accomodation.  Extraocular movement is intact.  Conjunctivae are not inflamed  Ears:  Canals normal.  Tympanic membranes normal.  Nose:  Mildly congested turbinates.  No sinus tenderness.    Pharynx:  Normal Neck:  Supple.  Enlarged  posterior/lateral nodes are palpated bilaterally, tender to palpation on the left.   Lungs:  Clear to auscultation.  Breath sounds are equal.  Moving air well. Chest:  Distinct tenderness to palpation over the mid-sternum.  Heart:  Regular rate and rhythm without murmurs, rubs, or gallops.  Abdomen:  Nontender without masses or hepatosplenomegaly.  Bowel sounds are present.  No CVA or flank tenderness.  Extremities:  No edema.  Skin:  No rash present.    UC Treatments / Results  Labs (all labs ordered are listed, but only abnormal results are displayed) Labs Reviewed - No data to display  EKG  EKG Interpretation None       Radiology No results found.  Procedures Procedures (including critical care time)  Medications Ordered in UC Medications - No data to display   Initial Impression / Assessment and Plan / UC Course  I have reviewed the triage vital signs and the nursing notes.  Pertinent labs & imaging results that were available during my care of the patient were reviewed by me and considered in my medical decision making (see chart for details).     Begin Tamiflu. Take plain guaifenesin (1200mg  extended release tabs such as Mucinex) twice daily, with plenty of water, for cough and congestion.  May add Pseudoephedrine (30mg , one or two every 4 to 6 hours) for sinus congestion.  Get adequate rest.   May use Afrin nasal spray (or generic oxymetazoline) each morning for about 5 days and then discontinue.  Also recommend using saline nasal spray several times daily and saline nasal irrigation (AYR is a common brand).  Use Flonase nasal spray each morning after using Afrin nasal spray and saline nasal irrigation. Try warm salt water gargles for sore throat.  Stop all antihistamines for now (Coricidin, etc) and other non-prescription cough/cold preparations. May take Ibuprofen 200mg , 4 tabs every 8 hours with food for chest/sternum discomfort, body aches, etc. May take Delsym at  bedtime for cough at night. Followup with Family Doctor if not improved in one week.     Final Clinical Impressions(s) / UC Diagnoses   Final diagnoses:  Influenza-like illness    ED Discharge Orders        Ordered    oseltamivir (TAMIFLU) 75 MG capsule  Every 12 hours     12/07/17 1435  Kandra Nicolas, MD 12/07/17 1444

## 2017-12-07 NOTE — Discharge Instructions (Signed)
Take plain guaifenesin (1200mg  extended release tabs such as Mucinex) twice daily, with plenty of water, for cough and congestion.  May add Pseudoephedrine (30mg , one or two every 4 to 6 hours) for sinus congestion.  Get adequate rest.   May use Afrin nasal spray (or generic oxymetazoline) each morning for about 5 days and then discontinue.  Also recommend using saline nasal spray several times daily and saline nasal irrigation (AYR is a common brand).  Use Flonase nasal spray each morning after using Afrin nasal spray and saline nasal irrigation. Try warm salt water gargles for sore throat.  Stop all antihistamines for now (Coricidin, etc) and other non-prescription cough/cold preparations. May take Ibuprofen 200mg , 4 tabs every 8 hours with food for chest/sternum discomfort, body aches, etc. May take Delsym at bedtime for cough at night.

## 2017-12-07 NOTE — ED Triage Notes (Signed)
Fatigue, chills, fever, hoarseness, headache sinus pain, pressure, had negative strep test and culture on Tuesday. X 3 days

## 2017-12-08 ENCOUNTER — Ambulatory Visit: Payer: BLUE CROSS/BLUE SHIELD | Admitting: Physician Assistant

## 2017-12-14 ENCOUNTER — Encounter: Payer: Self-pay | Admitting: Family Medicine

## 2017-12-14 ENCOUNTER — Ambulatory Visit (INDEPENDENT_AMBULATORY_CARE_PROVIDER_SITE_OTHER): Payer: BLUE CROSS/BLUE SHIELD

## 2017-12-14 ENCOUNTER — Ambulatory Visit (INDEPENDENT_AMBULATORY_CARE_PROVIDER_SITE_OTHER): Payer: BLUE CROSS/BLUE SHIELD | Admitting: Family Medicine

## 2017-12-14 VITALS — BP 117/84 | HR 109 | Temp 98.0°F | Resp 17 | Wt 193.0 lb

## 2017-12-14 DIAGNOSIS — R059 Cough, unspecified: Secondary | ICD-10-CM

## 2017-12-14 DIAGNOSIS — R05 Cough: Secondary | ICD-10-CM

## 2017-12-14 DIAGNOSIS — J029 Acute pharyngitis, unspecified: Secondary | ICD-10-CM | POA: Diagnosis not present

## 2017-12-14 LAB — POCT RAPID STREP A (OFFICE): RAPID STREP A SCREEN: NEGATIVE

## 2017-12-14 MED ORDER — GUAIFENESIN-CODEINE 100-10 MG/5ML PO SOLN: 5.0000 mL | Freq: Four times a day (QID) | ORAL | 0 refills | Status: DC | PRN

## 2017-12-14 MED ORDER — PREDNISONE 10 MG PO TABS: 30.0000 mg | ORAL_TABLET | Freq: Every day | ORAL | 0 refills | Status: DC

## 2017-12-14 MED ORDER — AZITHROMYCIN 250 MG PO TABS: 250.0000 mg | ORAL_TABLET | Freq: Every day | ORAL | 0 refills | Status: DC

## 2017-12-14 MED ORDER — BENZONATATE 200 MG PO CAPS: 200.0000 mg | ORAL_CAPSULE | Freq: Three times a day (TID) | ORAL | 0 refills | Status: DC | PRN

## 2017-12-14 NOTE — Patient Instructions (Addendum)
Thank you for coming in today. Take tylenol for pain.  Take azithromycin antibiotic.  Use the cough medicines.  Get xray now.  Take prednisone if not better.  Recheck as needed.  Call or go to the emergency room if you get worse, have trouble breathing, have chest pains, or palpitations.    Laryngitis Laryngitis is swelling (inflammation) of your vocal cords. This causes hoarseness, coughing, loss of voice, sore throat, or a dry throat. When your vocal cords are inflamed, your voice sounds different. Laryngitis can be temporary (acute) or long-term (chronic). Most cases of acute laryngitis improve with time. Chronic laryngitis is laryngitis that lasts for more than three weeks. Follow these instructions at home:  Drink enough fluid to keep your pee (urine) clear or pale yellow.  Breathe in moist air. Use a humidifier if you live in a dry climate.  Take medicines only as told by your doctor.  Do not smoke cigarettes or electronic cigarettes. If you need help quitting, ask your doctor.  Talk as little as possible. Also avoid whispering, which can cause vocal strain.  Write instead of talking. Do this until your voice is back to normal. Contact a doctor if:  You have a fever.  Your pain is worse.  You have trouble swallowing. Get help right away if:  You cough up blood.  You have trouble breathing. This information is not intended to replace advice given to you by your health care provider. Make sure you discuss any questions you have with your health care provider. Document Released: 11/03/2011 Document Revised: 04/21/2016 Document Reviewed: 04/29/2014 Elsevier Interactive Patient Education  [employer].

## 2017-12-14 NOTE — Progress Notes (Signed)
Laura Johnston is a 47 y.o. female who presents to [facility] today for sore throat nonproductive cough and hoarseness.  Kinley has been sick with a mild cough and congestion for 10 days.  Over the last several days she is worsened developed a sore throat and hoarseness.  She denies any trouble breathing and swallowing but does notes that she has significant pain with swallowing.  She is tried some over-the-counter medications which have helped a bit.  No vomiting or diarrhea.   Past Medical History:  Diagnosis Date  . Abnormal Pap smear   . AMA (advanced maternal age) multigravida 38+   . GERD (gastroesophageal reflux disease)   . Gestational diabetes   . Vaginal delivery 08/19/2011   Past Surgical History:  Procedure Laterality Date  . APPENDECTOMY    . KNEE SURGERY    . LEEP    . TUMOR REMOVAL     from buttocks   Social History   Tobacco Use  . Smoking status: Former Research scientist (life sciences)  . Smokeless tobacco: Never Used  Substance Use Topics  . Alcohol use: No   family history includes Diabetes in her maternal uncle; Heart disease in her father; Hyperlipidemia in her father; Hypertension in her father; Stroke in her father and paternal grandmother.  ROS as above:  Medications: Current Outpatient Medications  Medication Sig Dispense Refill  . Chlorphen-Phenyleph-APAP (CORICIDIN D COLD/FLU/SINUS) 2-5-325 MG TABS Take by mouth.    . oseltamivir (TAMIFLU) 75 MG capsule Take 1 capsule (75 mg total) by mouth every 12 (twelve) hours. 10 capsule 0  . azithromycin (ZITHROMAX) 250 MG tablet Take 1 tablet (250 mg total) by mouth daily. Take first 2 tablets together, then 1 every day until finished. 6 tablet 0  . benzonatate (TESSALON) 200 MG capsule Take 1 capsule (200 mg total) by mouth 3 (three) times daily as needed for cough. 45 capsule 0  . guaiFENesin-codeine 100-10  MG/5ML syrup Take 5 mLs by mouth every 6 (six) hours as needed for cough. 120 mL 0  . predniSONE (DELTASONE) 10 MG tablet Take 3 tablets (30 mg total) by mouth daily with breakfast. Back up if not better 15 tablet 0   No current facility-administered medications for this visit.    Allergies  Allergen Reactions  . Hydrocortisone   . Meloxicam     Paralysis  . Prednisone     Bilateral legs swollen.  . Pseudoephedrine     REACTION: numbness to fingers/tongue    Health Maintenance Health Maintenance  Topic Date Due  . INFLUENZA VACCINE  06/28/2017  . PAP SMEAR  03/17/2018  . TETANUS/TDAP  08/19/2021  . HIV Screening  Completed     Exam:  BP 117/84   Pulse (!) 109   Temp 98 F (36.7 C) (Oral)   Resp 17   Wt 193 lb (87.5 kg)   LMP 10/25/2011   SpO2 98%   BMI 33.13 kg/m  Gen: Well NAD HEENT: EOMI,  MMM posterior pharynx minimally erythematous with cobblestoning without exudate. Mild cervical lymphadenopathy present bilaterally. Lungs: Normal work of breathing. CTABL hoarse voice  Heart: RRR no MRG heart rate 96 bpm per my check Abd: NABS, Soft. Nondistended, Nontender Exts: Brisk capillary refill, warm and well perfused.    Results for orders placed or performed in visit on 12/14/17 (from the past 72 hour(s))  POCT rapid strep A     Status: Normal   Collection Time: 12/14/17  9:05 AM  Result Value Ref Range   Rapid Strep A Screen Negative Negative   No results found.  Chest x-ray: Pending  Assessment and Plan: 47 y.o. female with laryngitis with a history of second sickening.  I think at this point is reasonable to treat with azithromycin.  Will use codeine cough syrup and Tessalon Perles for symptom control as well.  Take prednisone as a backup if not better.  Patient has a history of leg swelling with prednisone due to fluid retention.  She understands the risks and benefits this medicine and will use it as needed.   Orders Placed This Encounter  Procedures  .  DG Chest 2 View    Order Specific Question:   Reason for exam:    Answer:   Cough, assess intra-thoracic pathology    Order Specific Question:   Is the patient pregnant?    Answer:   No    Order Specific Question:   Preferred imaging location?    Answer:   [facility]  . POCT rapid strep A   Meds ordered this encounter  Medications  . azithromycin (ZITHROMAX) 250 MG tablet    Sig: Take 1 tablet (250 mg total) by mouth daily. Take first 2 tablets together, then 1 every day until finished.    Dispense:  6 tablet    Refill:  0  . guaiFENesin-codeine 100-10 MG/5ML syrup    Sig: Take 5 mLs by mouth every 6 (six) hours as needed for cough.    Dispense:  120 mL    Refill:  0  . predniSONE (DELTASONE) 10 MG tablet    Sig: Take 3 tablets (30 mg total) by mouth daily with breakfast. Back up if not better    Dispense:  15 tablet    Refill:  0  . benzonatate (TESSALON) 200 MG capsule    Sig: Take 1 capsule (200 mg total) by mouth 3 (three) times daily as needed for cough.    Dispense:  45 capsule    Refill:  0     Discussed warning signs or symptoms. Please see discharge instructions. Patient expresses understanding.

## 2017-12-15 ENCOUNTER — Ambulatory Visit: Payer: BLUE CROSS/BLUE SHIELD | Admitting: Sports Medicine

## 2017-12-18 ENCOUNTER — Ambulatory Visit: Payer: BLUE CROSS/BLUE SHIELD | Admitting: Sports Medicine

## 2017-12-18 DIAGNOSIS — M1711 Unilateral primary osteoarthritis, right knee: Secondary | ICD-10-CM

## 2017-12-18 NOTE — Assessment & Plan Note (Signed)
Good response after Monovisc injection 1 month ago. Still get some gelling, this is controlled with an occasional diclofenac. Continue knee brace. Return as needed.

## 2017-12-18 NOTE — Progress Notes (Signed)
Subjective:    CC: Recheck knee  HPI: Right knee osteoarthritis: Pain essentially resolved after Monovisc injection last month.  I reviewed the past medical history, family history, social history, surgical history, and allergies today and no changes were needed.  Please see the problem list section below in epic for further details.  Past Medical History: Past Medical History:  Diagnosis Date  . Abnormal Pap smear   . AMA (advanced maternal age) multigravida 38+   . GERD (gastroesophageal reflux disease)   . Gestational diabetes   . Vaginal delivery 08/19/2011   Past Surgical History: Past Surgical History:  Procedure Laterality Date  . APPENDECTOMY    . KNEE SURGERY    . LEEP    . TUMOR REMOVAL     from buttocks   Social History: Social History   Socioeconomic History  . Marital status: Single    Spouse name: Not on file  . Number of children: Not on file  . Years of education: Not on file  . Highest education level: Not on file  Social Needs  . Financial resource strain: Not on file  . Food insecurity - worry: Not on file  . Food insecurity - inability: Not on file  . Transportation needs - medical: Not on file  . Transportation needs - non-medical: Not on file  Occupational History  . Not on file  Tobacco Use  . Smoking status: Former Research scientist (life sciences)  . Smokeless tobacco: Never Used  Substance and Sexual Activity  . Alcohol use: No  . Drug use: No  . Sexual activity: No  Other Topics Concern  . Not on file  Social History Narrative  . Not on file   Family History: Family History  Problem Relation Age of Onset  . Stroke Father   . Hypertension Father   . Hyperlipidemia Father   . Heart disease Father   . Diabetes Maternal Uncle   . Stroke Paternal Grandmother    Allergies: Allergies  Allergen Reactions  . Hydrocortisone   . Meloxicam     Paralysis  . Prednisone     Bilateral legs swollen.  . Pseudoephedrine     REACTION: numbness to fingers/tongue    Medications: See med rec.  Review of Systems: No fevers, chills, night sweats, weight loss, chest pain, or shortness of breath.   Objective:    General: Well Developed, well nourished, and in no acute distress.  Neuro: Alert and oriented x3, extra-ocular muscles intact, sensation grossly intact.  HEENT: Normocephalic, atraumatic, pupils equal round reactive to light, neck supple, no masses, no lymphadenopathy, thyroid nonpalpable.  Skin: Warm and dry, no rashes. Cardiac: Regular rate and rhythm, no murmurs rubs or gallops, no lower extremity edema.  Respiratory: Clear to auscultation bilaterally. Not using accessory muscles, speaking in full sentences. Right knee: Normal to inspection with no erythema or effusion or obvious bony abnormalities. Palpation normal with no warmth or joint line tenderness or patellar tenderness or condyle tenderness. ROM normal in flexion and extension and lower leg rotation. Ligaments with solid consistent endpoints including ACL, PCL, LCL, MCL. Negative Mcmurray's and provocative meniscal tests. Non painful patellar compression. Patellar and quadriceps tendons unremarkable. Hamstring and quadriceps strength is normal.  Impression and Recommendations:    Primary osteoarthritis of right knee Good response after Monovisc injection 1 month ago. Still get some gelling, this is controlled with an occasional diclofenac. Continue knee brace. Return as needed. ___________________________________________ Gwen Her. Dianah Field, M.D., ABFM., CAQSM. Primary Care and [facility]  Adjunct Instructor of [employer]

## 2018-01-22 ENCOUNTER — Ambulatory Visit (INDEPENDENT_AMBULATORY_CARE_PROVIDER_SITE_OTHER): Payer: BLUE CROSS/BLUE SHIELD | Admitting: Physician Assistant

## 2018-01-22 ENCOUNTER — Encounter: Payer: Self-pay | Admitting: Physician Assistant

## 2018-01-22 VITALS — BP 141/93 | HR 108 | Temp 98.5°F | Wt 198.0 lb

## 2018-01-22 DIAGNOSIS — J0100 Acute maxillary sinusitis, unspecified: Secondary | ICD-10-CM | POA: Diagnosis not present

## 2018-01-22 DIAGNOSIS — Z131 Encounter for screening for diabetes mellitus: Secondary | ICD-10-CM | POA: Diagnosis not present

## 2018-01-22 DIAGNOSIS — J029 Acute pharyngitis, unspecified: Secondary | ICD-10-CM | POA: Diagnosis not present

## 2018-01-22 DIAGNOSIS — R253 Fasciculation: Secondary | ICD-10-CM | POA: Diagnosis not present

## 2018-01-22 DIAGNOSIS — Z130 Encounter for screening for diseases of the blood and blood-forming organs and certain disorders involving the immune mechanism: Secondary | ICD-10-CM | POA: Diagnosis not present

## 2018-01-22 DIAGNOSIS — J069 Acute upper respiratory infection, unspecified: Secondary | ICD-10-CM | POA: Diagnosis not present

## 2018-01-22 LAB — POCT RAPID STREP A (OFFICE): Rapid Strep A Screen: NEGATIVE

## 2018-01-22 LAB — POCT INFLUENZA A/B
INFLUENZA A, POC: NEGATIVE
Influenza B, POC: NEGATIVE

## 2018-01-22 MED ORDER — CEFUROXIME AXETIL 250 MG PO TABS: 250.0000 mg | ORAL_TABLET | Freq: Two times a day (BID) | ORAL | 0 refills | Status: AC

## 2018-01-22 NOTE — Patient Instructions (Signed)

## 2018-01-22 NOTE — Progress Notes (Signed)
HPI:                                                                Laura Johnston is a 47 y.o. female who presents to [facility] today for influenza-like symptoms  Influenza  This is a new problem. The current episode started yesterday. The problem occurs constantly. The problem has been gradually worsening. Associated symptoms include anorexia, chills, congestion, fatigue, headaches, nausea, a sore throat and swollen glands. Pertinent negatives include no abdominal pain, coughing, fever, neck pain or vomiting. The symptoms are aggravated by eating. She has tried nothing for the symptoms.  Since January 8, she has been seen in urgent care and our office for URI symptoms. Rapid strep and influenza tests have been negative. She has made a full recovery between illnesses and then relapsed. She was most recently treated with Azithromycin and Prednisone for acute pharyngitis on 12/14/2017. She is concerned that she has "low immunity" and is requesting a work-up. She has no history of immunosuppression or diabetes. She does report multiple sick contacts the last 2 months.   She also complains of intermittent eyelid twitching. Twitching is provoked by coughing, sneezing and bending forward. It occurs in both eyes, but not concurrently. Denies vision change, facial paresthesia or weakness.  No flowsheet data found.  No flowsheet data found.    Past Medical History:  Diagnosis Date  . Abnormal Pap smear   . AMA (advanced maternal age) multigravida 65+   . GERD (gastroesophageal reflux disease)   . Gestational diabetes   . Vaginal delivery 08/19/2011   Past Surgical History:  Procedure Laterality Date  . APPENDECTOMY    . KNEE SURGERY    . LEEP    . TUMOR REMOVAL     from buttocks   Social History   Tobacco Use  . Smoking status: Former Research scientist (life sciences)  . Smokeless tobacco: Never Used  Substance Use Topics  . Alcohol use: No   family  history includes Diabetes in her maternal uncle; Heart disease in her father; Hyperlipidemia in her father; Hypertension in her father; Stroke in her father and paternal grandmother.    ROS: negative except as noted in the HPI  Medications: No current outpatient medications on file.   No current facility-administered medications for this visit.    Allergies  Allergen Reactions  . Hydrocortisone   . Meloxicam     Paralysis  . Prednisone     Bilateral legs swollen.  . Pseudoephedrine     REACTION: numbness to fingers/tongue       Objective:  BP (!) 141/93   Pulse (!) 108   Temp 98.5 F (36.9 C) (Oral)   Wt 198 lb (89.8 kg)   LMP 10/25/2011   SpO2 97%   BMI 33.99 kg/m  Gen:  alert, ill-appearing, not toxic-appearing, no distress, appropriate for age, obese female HEENT: head normocephalic without obvious abnormality, conjunctiva and cornea clear, wearing glasses, EOM's intact, right face appears slightly swollen compared to left, there is right maxillary sinus tenderness, nasal mucosa edematous, tonsils swollen with erythema, no exudates, uvula midline, neck supple, there is anterior adenopathy, neck supple, trachea midline Pulm: Normal work of breathing, voice is hoarse, clear to auscultation bilaterally, no wheezes, rales or  rhonchi CV: tachycardic at 106, regular rhythm, s1 and s2 distinct, no murmurs, clicks or rubs  Neuro: alert and oriented x 3, no tremor MSK: extremities atraumatic, normal gait and station Skin: intact, no rashes on exposed skin, no jaundice, no cyanosis   CLINICAL DATA:  Cough for 2 weeks  EXAM: CHEST  2 VIEW  COMPARISON:  May 17, 2017  FINDINGS: There is no edema or consolidation. There is mild scarring in the lingula. The heart size and pulmonary vascularity are normal. No adenopathy. No bone lesions.  IMPRESSION: Mild scarring in the lingula. No edema or consolidation. Stable cardiac silhouette.   Electronically Signed    By: Lowella Grip III M.D.   On: 12/14/2017 13:49  No results found for this or any previous visit (from the past 72 hour(s)). No results found.    Assessment and Plan: 47 y.o. female with   1. Acute pharyngitis, unspecified etiology - Centor score 3, rapid strep A negative. - CBC with Differential/Platelet - Comprehensive metabolic panel - HIV antibody - Epstein-Barr virus VCA antibody panel - Hemoglobin A1c  2. Acute sinusitis - my suspicion is that patient has bacterial sinusitis that did not receive an adequate course of antibiotics and she is having second sickening. I feel this is more likely than multiple separate viral URI's in a 73-month period. I am going to treat her with Ceftin 250 mg bid x 10 days. I do not think we need to do an immunity work-up at this point, and could wait pending antibiotic trial. However, patient is concerned and has failed other treatments, so reasonable to proceed with laboratory work-up. Close follow-up in 1 week. - POCT Influenza A/B - POCT rapid strep A  3. Recurrent upper respiratory tract infection - negative CXR on 12/14/2017. There is scarring of the lingula. She has a smoking history as well as hx of CAP - CBC with Differential/Platelet - Comprehensive metabolic panel - HIV antibody - Epstein-Barr virus VCA antibody panel - Hemoglobin A1c  4. Screening for diabetes mellitus - Hemoglobin A1c  5. Screening for blood disease - CBC with Differential/Platelet - Comprehensive metabolic panel  6. Eye muscle twitches - recommend rest, limit caffeine, trial OTC eye lubricant for dry eye - reassurance and active surveillance  Patient education and anticipatory guidance given Patient agrees with treatment plan Follow-up in 1 week or sooner as needed if symptoms worsen or fail to improve  Darlyne Russian PA-C

## 2018-01-23 ENCOUNTER — Encounter: Payer: Self-pay | Admitting: Physician Assistant

## 2018-01-23 DIAGNOSIS — R7303 Prediabetes: Secondary | ICD-10-CM

## 2018-01-23 HISTORY — DX: Prediabetes: R73.03

## 2018-01-23 LAB — CBC WITH DIFFERENTIAL/PLATELET
BASOS ABS: 60 {cells}/uL (ref 0–200)
Basophils Relative: 1 %
EOS ABS: 228 {cells}/uL (ref 15–500)
Eosinophils Relative: 3.8 %
HCT: 37.8 % (ref 35.0–45.0)
HEMOGLOBIN: 13.2 g/dL (ref 11.7–15.5)
Lymphs Abs: 816 cells/uL — ABNORMAL LOW (ref 850–3900)
MCH: 30.3 pg (ref 27.0–33.0)
MCHC: 34.9 g/dL (ref 32.0–36.0)
MCV: 86.7 fL (ref 80.0–100.0)
MONOS PCT: 6.8 %
MPV: 11 fL (ref 7.5–12.5)
Neutro Abs: 4488 cells/uL (ref 1500–7800)
Neutrophils Relative %: 74.8 %
PLATELETS: 149 10*3/uL (ref 140–400)
RBC: 4.36 10*6/uL (ref 3.80–5.10)
RDW: 12.1 % (ref 11.0–15.0)
TOTAL LYMPHOCYTE: 13.6 %
WBC mixed population: 408 cells/uL (ref 200–950)
WBC: 6 10*3/uL (ref 3.8–10.8)

## 2018-01-23 LAB — COMPREHENSIVE METABOLIC PANEL
AG RATIO: 1.6 (calc) (ref 1.0–2.5)
ALKALINE PHOSPHATASE (APISO): 68 U/L (ref 33–115)
ALT: 85 U/L — AB (ref 6–29)
AST: 59 U/L — AB (ref 10–35)
Albumin: 4.6 g/dL (ref 3.6–5.1)
BUN: 14 mg/dL (ref 7–25)
CO2: 30 mmol/L (ref 20–32)
Calcium: 9.4 mg/dL (ref 8.6–10.2)
Chloride: 100 mmol/L (ref 98–110)
Creat: 0.66 mg/dL (ref 0.50–1.10)
GLUCOSE: 93 mg/dL (ref 65–99)
Globulin: 2.8 g/dL (calc) (ref 1.9–3.7)
Potassium: 4.1 mmol/L (ref 3.5–5.3)
Sodium: 137 mmol/L (ref 135–146)
Total Bilirubin: 0.4 mg/dL (ref 0.2–1.2)
Total Protein: 7.4 g/dL (ref 6.1–8.1)

## 2018-01-23 LAB — EPSTEIN-BARR VIRUS VCA ANTIBODY PANEL
EBV NA IgG: 323 U/mL — ABNORMAL HIGH
EBV VCA IGG: 68.8 U/mL — AB

## 2018-01-23 LAB — HEMOGLOBIN A1C
HEMOGLOBIN A1C: 5.7 %{Hb} — AB (ref <5.7)
Mean Plasma Glucose: 117 (calc)
eAG (mmol/L): 6.5 (calc)

## 2018-01-23 LAB — HIV ANTIBODY (ROUTINE TESTING W REFLEX): HIV 1&2 Ab, 4th Generation: NONREACTIVE

## 2018-01-23 NOTE — Progress Notes (Signed)
Good evening Shaine,  Your labs look great. Your white blood cell count is normal. Your lymphocytes are slightly low, consistent with an active sinus infection. You have immunity to infectious mono, so this is not the cause of your sore throat.  Your A1c level is consistent with prediabetes. This means your body is starting to become resistant to insulin. You do not need medication for this, but you should follow a low-carb, low-fat diet, exercises regularly and work on losing weight. Recommend you follow-up with Dr. Madilyn Fireman at least every 6 months.  Best, Evlyn Clines

## 2018-01-25 ENCOUNTER — Other Ambulatory Visit: Payer: Self-pay

## 2018-01-25 ENCOUNTER — Telehealth: Payer: Self-pay

## 2018-01-25 DIAGNOSIS — R053 Chronic cough: Secondary | ICD-10-CM

## 2018-01-25 DIAGNOSIS — R05 Cough: Secondary | ICD-10-CM

## 2018-01-25 MED ORDER — HYDROCOD POLST-CPM POLST ER 10-8 MG/5ML PO SUER: 5.0000 mL | Freq: Every evening | ORAL | 0 refills | Status: DC | PRN

## 2018-01-25 NOTE — Telephone Encounter (Signed)
Patient called in requesting medication refill on Tussionex to help with coughing at night.

## 2018-01-25 NOTE — Telephone Encounter (Signed)
Patient called in stating that her coughing has not gotten any better and requesting a refill on Tussionex.

## 2018-01-31 ENCOUNTER — Ambulatory Visit (INDEPENDENT_AMBULATORY_CARE_PROVIDER_SITE_OTHER): Payer: BLUE CROSS/BLUE SHIELD | Admitting: Physician Assistant

## 2018-01-31 ENCOUNTER — Encounter: Payer: Self-pay | Admitting: Physician Assistant

## 2018-01-31 VITALS — BP 119/86 | HR 90 | Temp 97.7°F | Wt 193.0 lb

## 2018-01-31 DIAGNOSIS — R05 Cough: Secondary | ICD-10-CM | POA: Diagnosis not present

## 2018-01-31 DIAGNOSIS — E6609 Other obesity due to excess calories: Secondary | ICD-10-CM | POA: Diagnosis not present

## 2018-01-31 DIAGNOSIS — R7303 Prediabetes: Secondary | ICD-10-CM | POA: Diagnosis not present

## 2018-01-31 DIAGNOSIS — K760 Fatty (change of) liver, not elsewhere classified: Secondary | ICD-10-CM | POA: Diagnosis not present

## 2018-01-31 DIAGNOSIS — E66811 Obesity, class 1: Secondary | ICD-10-CM | POA: Insufficient documentation

## 2018-01-31 DIAGNOSIS — R058 Other specified cough: Secondary | ICD-10-CM

## 2018-01-31 DIAGNOSIS — J984 Other disorders of lung: Secondary | ICD-10-CM

## 2018-01-31 NOTE — Progress Notes (Signed)
HPI:                                                                Laura Johnston is a 47 y.o. female who presents to [place]: [place] today for follow-up cough and sinusitis  This is a pleasant 47 yo F with PMH of prediabtes, transaminitis, obesity, former smoker who presented 1 week ago with recurrent URI symptoms. She was treated with 10 day course of Cefuroxime and reports symptoms have improved. She continues to endorse nasal congestion, clear rhinorrhea, and non-productive cough. Denies fever, chills, malaise, chest tightness, hemoptysis or dyspnea.   Since January 8, she has been seen in urgent care and our office for URI symptoms. Rapid strep and influenza tests have been negative,except for mild scarring in her lingula. Chest x-ray on 12/14/17 was negative. She has made a full recovery between illnesses and then relapsed. She was most recently treated with Azithromycin and Prednisone for acute pharyngitis on 12/14/2017. She is concerned that she has "low immunity" and is requesting a work-up. She has no history of immunosuppression or diabetes. She does report multiple sick contacts the last 2 months  She is also requesting a referral to a nutritionist for help with weight loss.  No flowsheet data found.  No flowsheet data found.    Past Medical History:  Diagnosis Date  . Abnormal Pap smear   . AMA (advanced maternal age) multigravida 74+   . GERD (gastroesophageal reflux disease)   . Gestational diabetes   . Prediabetes 01/23/2018  . Vaginal delivery 08/19/2011   Past Surgical History:  Procedure Laterality Date  . APPENDECTOMY    . KNEE SURGERY    . LEEP    . TUMOR REMOVAL     from buttocks   Social History   Tobacco Use  . Smoking status: Former Smoker    Years: 2.00    Types: Cigarettes    Last attempt to quit: 01/23/2016    Years since quitting: 2.0  . Smokeless tobacco: Never Used  . Tobacco comment: 1-3  cig/day  Substance Use Topics  . Alcohol use: No   family history includes Diabetes in her maternal uncle; Heart disease in her father; Hyperlipidemia in her father; Hypertension in her father; Stroke in her father and paternal grandmother.    ROS: negative except as noted in the HPI  Medications: Current Outpatient Medications  Medication Sig Dispense Refill  . cefUROXime (CEFTIN) 250 MG tablet Take 1 tablet (250 mg total) by mouth 2 (two) times daily with a meal for 10 days. 20 tablet 0  . chlorpheniramine-HYDROcodone (TUSSIONEX PENNKINETIC ER) 10-8 MG/5ML SUER Take 5 mLs by mouth at bedtime as needed for cough. 115 mL 0   No current facility-administered medications for this visit.    Allergies  Allergen Reactions  . Hydrocortisone   . Meloxicam     Paralysis  . Prednisone     Bilateral legs swollen.  . Pseudoephedrine     REACTION: numbness to fingers/tongue       Objective:  BP 119/86   Pulse 90   Temp 97.7 F (36.5 C) (Oral)   Wt 193 lb (87.5 kg)   LMP 10/25/2011   SpO2 97%   BMI 33.13 kg/m  Gen:  alert, not ill-appearing, no distress, appropriate for age 50: head normocephalic without obvious abnormality, conjunctiva and cornea clear, wearing glasses, TM's clear bilaterally, deviated nasal septum, nasal mucosa edematous, oropharynx clear, moist mucous membranes, neck supple, no adenopathy, trachea midline Pulm: Normal work of breathing, normal phonation, clear to auscultation bilaterally, no wheezes, rales or rhonchi CV: Normal rate, regular rhythm, s1 and s2 distinct, no murmurs, clicks or rubs  Neuro: alert and oriented x 3, no tremor MSK: extremities atraumatic, normal gait and station Skin: intact, no rashes on exposed skin, no jaundice, no cyanosis Psych: well-groomed, cooperative, good eye contact, euthymic mood, affect mood-congruent, speech is articulate, and thought processes clear and goal-directed    No results found for this or any previous  visit (from the past 72 hour(s)). No results found.    Assessment and Plan: 47 y.o. female with   1. Post-viral cough syndrome - reassurance. continue symptomatic care. Discussed return precautions.  2. Prediabetes - Amb ref to Medical Nutrition Therapy-MNT  3. Fatty liver disease, nonalcoholic Lab Results  Component Value Date   ALT 85 (H) 01/22/2018   AST 59 (H) 01/22/2018   ALKPHOS 54 05/10/2017   BILITOT 0.4 01/22/2018  - Amb ref to Medical Nutrition Therapy-MNT  4. Class 1 obesity due to excess calories with serious comorbidity in adult, unspecified BMI Wt Readings from Last 3 Encounters:  01/31/18 193 lb (87.5 kg)  01/22/18 198 lb (89.8 kg)  12/18/17 196 lb (88.9 kg)  - Amb ref to Medical Nutrition Therapy-MNT  5. 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 Patient is a former smoker. Consider CT chest if cough does not resolve, or symptoms worsen.   Patient education and anticipatory guidance given Patient agrees with treatment plan Follow-up as needed if symptoms worsen or fail to improve  Darlyne Russian PA-C

## 2018-01-31 NOTE — Patient Instructions (Signed)
-   Fluticasone (Flonase) 1 spray each nostril at bedtime - Mucinex during the day to make cough more productive - Benadryl as needed at bedtime

## 2018-03-13 DIAGNOSIS — N760 Acute vaginitis: Secondary | ICD-10-CM | POA: Diagnosis not present

## 2018-03-29 DIAGNOSIS — Z713 Dietary counseling and surveillance: Secondary | ICD-10-CM | POA: Diagnosis not present

## 2018-04-13 ENCOUNTER — Ambulatory Visit (INDEPENDENT_AMBULATORY_CARE_PROVIDER_SITE_OTHER): Payer: BLUE CROSS/BLUE SHIELD

## 2018-04-13 ENCOUNTER — Encounter: Payer: Self-pay | Admitting: Sports Medicine

## 2018-04-13 ENCOUNTER — Ambulatory Visit (INDEPENDENT_AMBULATORY_CARE_PROVIDER_SITE_OTHER): Payer: BLUE CROSS/BLUE SHIELD | Admitting: Sports Medicine

## 2018-04-13 DIAGNOSIS — M7989 Other specified soft tissue disorders: Secondary | ICD-10-CM

## 2018-04-13 DIAGNOSIS — S93402A Sprain of unspecified ligament of left ankle, initial encounter: Secondary | ICD-10-CM | POA: Diagnosis not present

## 2018-04-13 DIAGNOSIS — M25572 Pain in left ankle and joints of left foot: Secondary | ICD-10-CM | POA: Diagnosis not present

## 2018-04-13 DIAGNOSIS — IMO0001 Reserved for inherently not codable concepts without codable children: Secondary | ICD-10-CM

## 2018-04-13 DIAGNOSIS — S99912A Unspecified injury of left ankle, initial encounter: Secondary | ICD-10-CM | POA: Diagnosis not present

## 2018-04-13 MED ORDER — IBUPROFEN 800 MG PO TABS: 800.0000 mg | ORAL_TABLET | Freq: Three times a day (TID) | ORAL | 2 refills | Status: DC | PRN

## 2018-04-13 NOTE — Assessment & Plan Note (Signed)
X-rays, ASO, rehab exercises, minimal weightbearing with crutches, she needs to wear tennis shoes. Ibuprofen 800 for pain.

## 2018-04-13 NOTE — Progress Notes (Signed)
Subjective:    I'm seeing this patient as a consultation for: Dr. Beatrice Lecher  CC: Left ankle injury  HPI: Yesterday this pleasant 47 year old female took a misstep, inverted her left ankle, immediate pain, swelling, bruising.  She was able to bear weight.  Pain is moderate, persistent, localized over the lateral malleolus, no radiation.  I reviewed the past medical history, family history, social history, surgical history, and allergies today and no changes were needed.  Please see the problem list section below in epic for further details.  Past Medical History: Past Medical History:  Diagnosis Date  . Abnormal Pap smear   . AMA (advanced maternal age) multigravida 71+   . GERD (gastroesophageal reflux disease)   . Gestational diabetes   . Prediabetes 01/23/2018  . Vaginal delivery 08/19/2011   Past Surgical History: Past Surgical History:  Procedure Laterality Date  . APPENDECTOMY    . KNEE SURGERY    . LEEP    . TUMOR REMOVAL     from buttocks   Social History: Social History   Socioeconomic History  . Marital status: Single    Spouse name: Not on file  . Number of children: Not on file  . Years of education: Not on file  . Highest education level: Not on file  Occupational History  . Not on file  Social Needs  . Financial resource strain: Not on file  . Food insecurity:    Worry: Not on file    Inability: Not on file  . Transportation needs:    Medical: Not on file    Non-medical: Not on file  Tobacco Use  . Smoking status: Former Smoker    Years: 2.00    Types: Cigarettes    Last attempt to quit: 01/23/2016    Years since quitting: 2.2  . Smokeless tobacco: Never Used  . Tobacco comment: 1-3 cig/day  Substance and Sexual Activity  . Alcohol use: No  . Drug use: No  . Sexual activity: Never  Lifestyle  . Physical activity:    Days per week: Not on file    Minutes per session: Not on file  . Stress: Not on file  Relationships  . Social  connections:    Talks on phone: Not on file    Gets together: Not on file    Attends religious service: Not on file    Active member of club or organization: Not on file    Attends meetings of clubs or organizations: Not on file    Relationship status: Not on file  Other Topics Concern  . Not on file  Social History Narrative  . Not on file   Family History: Family History  Problem Relation Age of Onset  . Stroke Father   . Hypertension Father   . Hyperlipidemia Father   . Heart disease Father   . Diabetes Maternal Uncle   . Stroke Paternal Grandmother    Allergies: Allergies  Allergen Reactions  . Hydrocortisone   . Meloxicam     Paralysis  . Prednisone     Bilateral legs swollen.  . Pseudoephedrine     REACTION: numbness to fingers/tongue   Medications: See med rec.  Review of Systems: No headache, visual changes, nausea, vomiting, diarrhea, constipation, dizziness, abdominal pain, skin rash, fevers, chills, night sweats, weight loss, swollen lymph nodes, body aches, joint swelling, muscle aches, chest pain, shortness of breath, mood changes, visual or auditory hallucinations.   Objective:   General: Well Developed, well  nourished, and in no acute distress.  Neuro:  Extra-ocular muscles intact, able to move all 4 extremities, sensation grossly intact.  Deep tendon reflexes tested were normal. Psych: Alert and oriented, mood congruent with affect. ENT:  Ears and nose appear unremarkable.  Hearing grossly normal. Neck: Unremarkable overall appearance, trachea midline.  No visible thyroid enlargement. Eyes: Conjunctivae and lids appear unremarkable.  Pupils equal and round. Skin: Warm and dry, no rashes noted.  Cardiovascular: Pulses palpable, no extremity edema. Left ankle: Swelling laterally Range of motion is full in all directions. Strength is 5/5 in all directions. Stable lateral and medial ligaments; squeeze test and kleiger test unremarkable; Talar dome  nontender; No pain at base of 5th MT; No tenderness over cuboid; No tenderness over N spot or navicular prominence Tenderness over the tip of the lateral malleolus No sign of peroneal tendon subluxations; Negative tarsal tunnel tinel's Able to walk 4 steps.  ASO applied  Impression and Recommendations:   This case required medical decision making of moderate complexity.  First degree ankle sprain, left, initial encounter X-rays, ASO, rehab exercises, minimal weightbearing with crutches, she needs to wear tennis shoes. Ibuprofen 800 for pain. ___________________________________________ Gwen Her. Dianah Field, M.D., ABFM., CAQSM. Primary Care and [place] Instructor of [place] of [facility] of Medicine

## 2018-04-18 ENCOUNTER — Encounter: Payer: Self-pay | Admitting: Sports Medicine

## 2018-04-19 ENCOUNTER — Ambulatory Visit (INDEPENDENT_AMBULATORY_CARE_PROVIDER_SITE_OTHER): Payer: BLUE CROSS/BLUE SHIELD | Admitting: Sports Medicine

## 2018-04-19 ENCOUNTER — Telehealth: Payer: Self-pay | Admitting: Sports Medicine

## 2018-04-19 VITALS — BP 130/80 | HR 85 | Temp 98.1°F | Wt 190.7 lb

## 2018-04-19 DIAGNOSIS — IMO0001 Reserved for inherently not codable concepts without codable children: Secondary | ICD-10-CM

## 2018-04-19 DIAGNOSIS — S93402A Sprain of unspecified ligament of left ankle, initial encounter: Secondary | ICD-10-CM | POA: Diagnosis not present

## 2018-04-19 MED ORDER — AMBULATORY NON FORMULARY MEDICATION: 0 refills | Status: DC

## 2018-04-19 NOTE — Telephone Encounter (Signed)
This has already been done.

## 2018-04-19 NOTE — Progress Notes (Signed)
Pt presented today for Left boot fitting. Per pt, still continues to have pain, unable to put weight on her toes. Pt tolerated medium left boot well. Pt informed to use boot when she is active & to removed boot at night when sleeping. Pt will continue to use ankle brace when sleeping. Pt was given a rx for Knee Scooter. Pt has agreed to keep 2 week appt with Dr. Darene Lamer for follow up.

## 2018-04-30 ENCOUNTER — Encounter: Payer: Self-pay | Admitting: Sports Medicine

## 2018-04-30 ENCOUNTER — Ambulatory Visit: Payer: BLUE CROSS/BLUE SHIELD | Admitting: Sports Medicine

## 2018-04-30 DIAGNOSIS — IMO0001 Reserved for inherently not codable concepts without codable children: Secondary | ICD-10-CM

## 2018-04-30 DIAGNOSIS — S93402A Sprain of unspecified ligament of left ankle, initial encounter: Secondary | ICD-10-CM

## 2018-04-30 NOTE — Progress Notes (Signed)
Subjective:    CC: Recheck ankle  HPI: This is a pleasant 47 year old female, I saw her a couple of weeks ago for a inversion injury of the left ankle, she was placed in a boot recently, overall doing better.  Only has a bit of discomfort, and has not needed NSAIDs.  I reviewed the past medical history, family history, social history, surgical history, and allergies today and no changes were needed.  Please see the problem list section below in epic for further details.  Past Medical History: Past Medical History:  Diagnosis Date  . Abnormal Pap smear   . AMA (advanced maternal age) multigravida 69+   . GERD (gastroesophageal reflux disease)   . Gestational diabetes   . Prediabetes 01/23/2018  . Vaginal delivery 08/19/2011   Past Surgical History: Past Surgical History:  Procedure Laterality Date  . APPENDECTOMY    . KNEE SURGERY    . LEEP    . TUMOR REMOVAL     from buttocks   Social History: Social History   Socioeconomic History  . Marital status: Single    Spouse name: Not on file  . Number of children: Not on file  . Years of education: Not on file  . Highest education level: Not on file  Occupational History  . Not on file  Social Needs  . Financial resource strain: Not on file  . Food insecurity:    Worry: Not on file    Inability: Not on file  . Transportation needs:    Medical: Not on file    Non-medical: Not on file  Tobacco Use  . Smoking status: Former Smoker    Years: 2.00    Types: Cigarettes    Last attempt to quit: 01/23/2016    Years since quitting: 2.2  . Smokeless tobacco: Never Used  . Tobacco comment: 1-3 cig/day  Substance and Sexual Activity  . Alcohol use: No  . Drug use: No  . Sexual activity: Never  Lifestyle  . Physical activity:    Days per week: Not on file    Minutes per session: Not on file  . Stress: Not on file  Relationships  . Social connections:    Talks on phone: Not on file    Gets together: Not on file   Attends religious service: Not on file    Active member of club or organization: Not on file    Attends meetings of clubs or organizations: Not on file    Relationship status: Not on file  Other Topics Concern  . Not on file  Social History Narrative  . Not on file   Family History: Family History  Problem Relation Age of Onset  . Stroke Father   . Hypertension Father   . Hyperlipidemia Father   . Heart disease Father   . Diabetes Maternal Uncle   . Stroke Paternal Grandmother    Allergies: Allergies  Allergen Reactions  . Hydrocortisone   . Meloxicam     Paralysis  . Prednisone     Bilateral legs swollen.  . Pseudoephedrine     REACTION: numbness to fingers/tongue   Medications: See med rec.  Review of Systems: No fevers, chills, night sweats, weight loss, chest pain, or shortness of breath.   Objective:    General: Well Developed, well nourished, and in no acute distress.  Neuro: Alert and oriented x3, extra-ocular muscles intact, sensation grossly intact.  HEENT: Normocephalic, atraumatic, pupils equal round reactive to light, neck supple, no masses,  no lymphadenopathy, thyroid nonpalpable.  Skin: Warm and dry, no rashes. Cardiac: Regular rate and rhythm, no murmurs rubs or gallops, no lower extremity edema.  Respiratory: Clear to auscultation bilaterally. Not using accessory muscles, speaking in full sentences. Left ankle: No visible erythema or swelling. Range of motion is full in all directions. Strength is 5/5 in all directions. Stable lateral and medial ligaments; squeeze test and kleiger test unremarkable; Talar dome nontender; Only minimal tenderness over the lateral ligaments No pain at base of 5th MT; No tenderness over cuboid; No tenderness over N spot or navicular prominence No tenderness on posterior aspects of lateral and medial malleolus No sign of peroneal tendon subluxations; Negative tarsal tunnel tinel's Able to walk 4 steps.  Impression  and Recommendations:    First degree ankle sprain, left, initial encounter Continues to improve, off of NSAIDs, has only been in the boot for 1 week now. X-rays were negative. Continue boot for another week, then return to see me if pain does not continue to improve. Open-ended follow-up for now. ___________________________________________ Gwen Her. Dianah Field, M.D., ABFM., CAQSM. Primary Care and [place] Instructor of [place] of [facility] of Medicine

## 2018-04-30 NOTE — Assessment & Plan Note (Signed)
Continues to improve, off of NSAIDs, has only been in the boot for 1 week now. X-rays were negative. Continue boot for another week, then return to see me if pain does not continue to improve. Open-ended follow-up for now.

## 2018-05-09 DIAGNOSIS — Z713 Dietary counseling and surveillance: Secondary | ICD-10-CM | POA: Diagnosis not present

## 2018-05-11 ENCOUNTER — Telehealth: Payer: Self-pay

## 2018-05-11 NOTE — Telephone Encounter (Signed)
Ok to take the ibuprofen Mucinex and Afrin.  She just cannot use the Afrin for more than 3 consecutive days or she can get a rebound phenomenon with her congestion.

## 2018-05-11 NOTE — Telephone Encounter (Signed)
Pt called because she is having some sinus congestions, green mucous, and not feeling well.   I offered pt an appt to be evaluated for possible sinus infection today and she declined.  Pt states she is currently taking Ibuprofen 800mg , Afrin, and Mucinex. She wants to make sure none of these medications interact and see if there is anything else she should be taking.   Please advise

## 2018-05-11 NOTE — Telephone Encounter (Signed)
Pt advised. She understands Afrin directions. Advised pt to come be evaluated if not better by Monday, she is agreeable and will call if not improving. No further needs at this time

## 2018-05-14 ENCOUNTER — Ambulatory Visit (INDEPENDENT_AMBULATORY_CARE_PROVIDER_SITE_OTHER): Payer: BLUE CROSS/BLUE SHIELD | Admitting: Family Medicine

## 2018-05-14 ENCOUNTER — Encounter: Payer: Self-pay | Admitting: Family Medicine

## 2018-05-14 VITALS — BP 107/72 | HR 88 | Temp 98.3°F | Ht 64.0 in | Wt 189.0 lb

## 2018-05-14 DIAGNOSIS — J0100 Acute maxillary sinusitis, unspecified: Secondary | ICD-10-CM | POA: Diagnosis not present

## 2018-05-14 MED ORDER — BENZONATATE 200 MG PO CAPS: 200.0000 mg | ORAL_CAPSULE | Freq: Two times a day (BID) | ORAL | 0 refills | Status: DC | PRN

## 2018-05-14 MED ORDER — AZITHROMYCIN 250 MG PO TABS: ORAL_TABLET | ORAL | 0 refills | Status: AC

## 2018-05-14 NOTE — Addendum Note (Signed)
Addended by: Beatrice Lecher D on: 05/14/2018 12:25 PM   Modules accepted: Orders

## 2018-05-14 NOTE — Progress Notes (Signed)
   Subjective:    Patient ID: Laura Johnston, female    DOB: 10/25/1971, 47 y.o.   MRN: [id number]  HPI 47 yo female is here today for cough and nasal congestion that started last week.   She says about 9 days ago she started feeling congested and had severe nasal congestion.  No fever, sweats or chills.  She started some sudafed but says it cuased her to have a severe HA. She then switched to Afrin and Mucinex. Only used the Afrin for 2 days and it did help. Some has dark grene mucous with some cough.     Review of Systems     Objective:   Physical Exam  Constitutional: She is oriented to person, place, and time. She appears well-developed and well-nourished.  HENT:  Head: Normocephalic and atraumatic.  Right Ear: External ear normal.  Left Ear: External ear normal.  Nose: Nose normal.  Mouth/Throat: Oropharynx is clear and moist.  TMs and canals are clear.   Eyes: Pupils are equal, round, and reactive to light. Conjunctivae and EOM are normal.  Neck: Neck supple. No thyromegaly present.  Cardiovascular: Normal rate, regular rhythm and normal heart sounds.  Pulmonary/Chest: Effort normal and breath sounds normal. She has no wheezes.  Lymphadenopathy:    She has no cervical adenopathy.  Neurological: She is alert and oriented to person, place, and time.  Skin: Skin is warm and dry.  Psychiatric: She has a normal mood and affect.       Assessment & Plan:  Acute sinusitis - will tx with zpack today. Call if not better in one week. Ok to use the mucinex.  Can use Affrin for a couple of more days.

## 2018-05-21 DIAGNOSIS — Z13820 Encounter for screening for osteoporosis: Secondary | ICD-10-CM | POA: Diagnosis not present

## 2018-05-21 DIAGNOSIS — Z01419 Encounter for gynecological examination (general) (routine) without abnormal findings: Secondary | ICD-10-CM | POA: Diagnosis not present

## 2018-05-21 DIAGNOSIS — Z1231 Encounter for screening mammogram for malignant neoplasm of breast: Secondary | ICD-10-CM | POA: Diagnosis not present

## 2018-05-21 DIAGNOSIS — N958 Other specified menopausal and perimenopausal disorders: Secondary | ICD-10-CM | POA: Diagnosis not present

## 2018-05-21 DIAGNOSIS — M8588 Other specified disorders of bone density and structure, other site: Secondary | ICD-10-CM | POA: Diagnosis not present

## 2018-05-21 DIAGNOSIS — Z6832 Body mass index (BMI) 32.0-32.9, adult: Secondary | ICD-10-CM | POA: Diagnosis not present

## 2018-07-11 DIAGNOSIS — H5203 Hypermetropia, bilateral: Secondary | ICD-10-CM | POA: Diagnosis not present

## 2018-10-10 DIAGNOSIS — J0100 Acute maxillary sinusitis, unspecified: Secondary | ICD-10-CM | POA: Diagnosis not present

## 2018-10-10 DIAGNOSIS — H10021 Other mucopurulent conjunctivitis, right eye: Secondary | ICD-10-CM | POA: Diagnosis not present

## 2018-11-06 ENCOUNTER — Telehealth: Payer: Self-pay

## 2018-11-06 NOTE — Telephone Encounter (Signed)
OK to send a new rx but needs to only test once every other day. Needs to stop testing multiple times a day.

## 2018-11-06 NOTE — Telephone Encounter (Signed)
Pt walked in stating she is prediabetic and her glucose meter has recently quit working. She reports she has been testing up to six times daily, before and after every meal. She received this machine when she was pregnant so it is very old.   Wants to know if Dr Madilyn Fireman can send in new script for her for meter, strips, and lancets to [facility] on [address]  Please advise.Marland Kitchen

## 2018-11-07 MED ORDER — BLOOD GLUCOSE METER KIT: PACK | 0 refills | Status: DC

## 2018-11-07 NOTE — Telephone Encounter (Signed)
Called and advised pt that RX will be sent but she should only test once QOD. Pt agreeable.   Advised her she is due for office visit and if she feels that she should be testing more, she should schedule appt to discuss.  No further needs at this time.

## 2018-12-06 ENCOUNTER — Other Ambulatory Visit: Payer: Self-pay | Admitting: Family Medicine

## 2018-12-06 ENCOUNTER — Other Ambulatory Visit: Payer: Self-pay | Admitting: *Deleted

## 2018-12-06 DIAGNOSIS — R7303 Prediabetes: Secondary | ICD-10-CM

## 2019-01-11 ENCOUNTER — Encounter: Payer: Self-pay | Admitting: Sports Medicine

## 2019-01-11 ENCOUNTER — Ambulatory Visit: Payer: BLUE CROSS/BLUE SHIELD | Admitting: Sports Medicine

## 2019-01-11 DIAGNOSIS — M1711 Unilateral primary osteoarthritis, right knee: Secondary | ICD-10-CM | POA: Diagnosis not present

## 2019-01-11 MED ORDER — CELECOXIB 200 MG PO CAPS: ORAL_CAPSULE | ORAL | 2 refills | Status: DC

## 2019-01-11 NOTE — Progress Notes (Signed)
Subjective:    CC: Right knee pain  HPI: This is a very pleasant 48 year old female, in December 2018 we did a Monovisc injection.  She responded extremely well and is only now starting to have recurrence of discomfort.  She has not tried any NSAIDs, no mechanical symptoms, no trauma.  Localized to the lateral joint line without radiation, moderate gelling.  I reviewed the past medical history, family history, social history, surgical history, and allergies today and no changes were needed.  Please see the problem list section below in epic for further details.  Past Medical History: Past Medical History:  Diagnosis Date  . Abnormal Pap smear   . AMA (advanced maternal age) multigravida 13+   . GERD (gastroesophageal reflux disease)   . Gestational diabetes   . Prediabetes 01/23/2018  . Vaginal delivery 08/19/2011   Past Surgical History: Past Surgical History:  Procedure Laterality Date  . APPENDECTOMY    . KNEE SURGERY    . LEEP    . TUMOR REMOVAL     from buttocks   Social History: Social History   Socioeconomic History  . Marital status: Single    Spouse name: Not on file  . Number of children: Not on file  . Years of education: Not on file  . Highest education level: Not on file  Occupational History  . Not on file  Social Needs  . Financial resource strain: Not on file  . Food insecurity:    Worry: Not on file    Inability: Not on file  . Transportation needs:    Medical: Not on file    Non-medical: Not on file  Tobacco Use  . Smoking status: Former Smoker    Years: 2.00    Types: Cigarettes    Last attempt to quit: 01/23/2016    Years since quitting: 2.9  . Smokeless tobacco: Never Used  . Tobacco comment: 1-3 cig/day  Substance and Sexual Activity  . Alcohol use: No  . Drug use: No  . Sexual activity: Never  Lifestyle  . Physical activity:    Days per week: Not on file    Minutes per session: Not on file  . Stress: Not on file  Relationships  .  Social connections:    Talks on phone: Not on file    Gets together: Not on file    Attends religious service: Not on file    Active member of club or organization: Not on file    Attends meetings of clubs or organizations: Not on file    Relationship status: Not on file  Other Topics Concern  . Not on file  Social History Narrative  . Not on file   Family History: Family History  Problem Relation Age of Onset  . Stroke Father   . Hypertension Father   . Hyperlipidemia Father   . Heart disease Father   . Diabetes Maternal Uncle   . Stroke Paternal Grandmother    Allergies: Allergies  Allergen Reactions  . Hydrocortisone   . Meloxicam     Paralysis  . Prednisone     Bilateral legs swollen.  . Pseudoephedrine     REACTION: numbness to fingers/tongue   Medications: See med rec.  Review of Systems: No fevers, chills, night sweats, weight loss, chest pain, or shortness of breath.   Objective:    General: Well Developed, well nourished, and in no acute distress.  Neuro: Alert and oriented x3, extra-ocular muscles intact, sensation grossly intact.  HEENT:  Normocephalic, atraumatic, pupils equal round reactive to light, neck supple, no masses, no lymphadenopathy, thyroid nonpalpable.  Skin: Warm and dry, no rashes. Cardiac: Regular rate and rhythm, no murmurs rubs or gallops, no lower extremity edema.  Respiratory: Clear to auscultation bilaterally. Not using accessory muscles, speaking in full sentences. Right knee: Normal to inspection with no erythema or effusion or obvious bony abnormalities. Tender to palpation at the lateral joint line, posterior aspect ROM normal in flexion and extension and lower leg rotation. Ligaments with solid consistent endpoints including ACL, PCL, LCL, MCL. Negative Mcmurray's and provocative meniscal tests. Non painful patellar compression. Patellar and quadriceps tendons unremarkable. Hamstring and quadriceps strength is  normal.  Impression and Recommendations:    Primary osteoarthritis of right knee Did extremely well after Monovisc injection into the right knee in December 2018. Has only had a recurrence of mild pain, lateral joint line for a few weeks now. Adding Celebrex, rehab exercises, return if no better in a few weeks or a month and will need to a steroid injection.  ___________________________________________ Gwen Her. Dianah Field, M.D., ABFM., CAQSM. Primary Care and Sports Medicine [facility]  Adjunct Professor of [place] of [facility] of Medicine

## 2019-01-11 NOTE — Assessment & Plan Note (Signed)
Did extremely well after Monovisc injection into the right knee in December 2018. Has only had a recurrence of mild pain, lateral joint line for a few weeks now. Adding Celebrex, rehab exercises, return if no better in a few weeks or a month and will need to a steroid injection.

## 2019-03-06 ENCOUNTER — Encounter: Payer: Self-pay | Admitting: Family Medicine

## 2019-03-07 ENCOUNTER — Telehealth (INDEPENDENT_AMBULATORY_CARE_PROVIDER_SITE_OTHER): Payer: BLUE CROSS/BLUE SHIELD | Admitting: Family Medicine

## 2019-03-07 ENCOUNTER — Encounter: Payer: Self-pay | Admitting: Family Medicine

## 2019-03-07 VITALS — BP 123/89 | HR 97 | Temp 98.4°F | Ht 64.0 in | Wt 193.6 lb

## 2019-03-07 DIAGNOSIS — R7301 Impaired fasting glucose: Secondary | ICD-10-CM

## 2019-03-07 DIAGNOSIS — R7303 Prediabetes: Secondary | ICD-10-CM

## 2019-03-07 DIAGNOSIS — K644 Residual hemorrhoidal skin tags: Secondary | ICD-10-CM

## 2019-03-07 MED ORDER — HYDROCORTISONE (PERIANAL) 2.5 % EX CREA: 1.0000 "application " | TOPICAL_CREAM | Freq: Two times a day (BID) | CUTANEOUS | 0 refills | Status: DC

## 2019-03-07 MED ORDER — LIDOCAINE HCL 2 % EX GEL: 1.0000 "application " | Freq: Three times a day (TID) | CUTANEOUS | 0 refills | Status: DC | PRN

## 2019-03-07 NOTE — Telephone Encounter (Signed)
Patient scheduled.

## 2019-03-07 NOTE — Progress Notes (Signed)
sxs began Saturday. She reports that she had she diarrhea all day. When she wiped she noticed blood. On Sunday she had discomfort and itchy. She wears a panty liner and notied light brown spots. On Monday it was hard to sit. On Tuesday she checked this in the mirror and believes that the area is the size of a penny but it is flat. The burning is still present and it feels wet. She has not had anymore diarrhea since Saturday. She has not used anything on the area to help with the pain,or burning. The last time she has had this issue was when she gave birth to her son. She feels that the area has gone down.Elouise Munroe, [place]

## 2019-03-07 NOTE — Progress Notes (Signed)
Virtual Visit via Video Note  I connected with My Noemi-Marquez on 03/07/19 at  8:30 AM EDT by a video enabled telemedicine application and verified that I am speaking with the correct person using two identifiers.   I discussed the limitations of evaluation and management by telemedicine and the availability of in person appointments. The patient expressed understanding and agreed to proceed.  Subjective:    CC: hemorrhoids  HPI:  Has had an external hemorrhoids since 2012 since birth of her son.  Says on Sunday went to the restroom and had a BM and doesn't remember straining and when cleaned herself saw bright red blood on the paper. After that had diarrhea that started about an hour later and then the rest of the day (4 times total) .  No abd pain with the diarrhea.  It was burning at that point.  Then went to work on Monday had another BM but not a hard stool. No straining.  Says feels constantly "wet" in the rectal area. Since then has been bleeding and leaving spot on her panty linger.  Light brown color. She has some discomfort with sitting but sometimes has to stand up bc of the discomfort.  She is starting to get a rash.   Impaired fasting glucose-no increased thirst or urination. No symptoms consistent with hypoglycemia.  She says she has not really been checking her blood sugars.   Past medical history, Surgical history, Family history not pertinant except as noted below, Social history, Allergies, and medications have been entered into the medical record, reviewed, and corrections made.   Review of Systems: No fevers, chills, night sweats, weight loss, chest pain, or shortness of breath.   Objective:    General: Speaking clearly in complete sentences without any shortness of breath.  Alert and oriented x3.  Normal judgment. No apparent acute distress.    Impression and Recommendations:    Hemorrhoids, acute inflammation - will treat with topical lidocaine and steroid.  Recommend Sitz baths once or twice a day.  Avoid pressure on the area.  Soften the stools with stool softener. If not better in 5 days please call bac,   IFG - due for A1C. Will order today and hopefully can go in June.    I discussed the assessment and treatment plan with the patient. The patient was provided an opportunity to ask questions and all were answered. The patient agreed with the plan and demonstrated an understanding of the instructions.   The patient was advised to call back or seek an in-person evaluation if the symptoms worsen or if the condition fails to improve as anticipated.    Beatrice Lecher, MD

## 2019-04-03 DIAGNOSIS — N760 Acute vaginitis: Secondary | ICD-10-CM | POA: Diagnosis not present

## 2019-04-03 DIAGNOSIS — N952 Postmenopausal atrophic vaginitis: Secondary | ICD-10-CM | POA: Diagnosis not present

## 2019-05-17 ENCOUNTER — Ambulatory Visit: Payer: BC Managed Care – PPO | Admitting: Osteopathic Medicine

## 2019-05-17 ENCOUNTER — Telehealth: Payer: Self-pay | Admitting: *Deleted

## 2019-05-17 ENCOUNTER — Encounter: Payer: Self-pay | Admitting: Osteopathic Medicine

## 2019-05-17 ENCOUNTER — Ambulatory Visit (INDEPENDENT_AMBULATORY_CARE_PROVIDER_SITE_OTHER): Payer: BC Managed Care – PPO | Admitting: Osteopathic Medicine

## 2019-05-17 ENCOUNTER — Telehealth: Payer: Self-pay | Admitting: Osteopathic Medicine

## 2019-05-17 ENCOUNTER — Other Ambulatory Visit: Payer: BC Managed Care – PPO

## 2019-05-17 VITALS — BP 121/85 | HR 83 | Temp 98.1°F | Wt 196.9 lb

## 2019-05-17 VITALS — BP 122/92 | HR 84 | Temp 98.3°F | Wt 194.7 lb

## 2019-05-17 DIAGNOSIS — Z20822 Contact with and (suspected) exposure to covid-19: Secondary | ICD-10-CM

## 2019-05-17 DIAGNOSIS — R002 Palpitations: Secondary | ICD-10-CM | POA: Diagnosis not present

## 2019-05-17 DIAGNOSIS — R6889 Other general symptoms and signs: Secondary | ICD-10-CM | POA: Diagnosis not present

## 2019-05-17 DIAGNOSIS — R42 Dizziness and giddiness: Secondary | ICD-10-CM

## 2019-05-17 LAB — POCT URINALYSIS DIPSTICK
Bilirubin, UA: NEGATIVE
Blood, UA: NEGATIVE
Glucose, UA: NEGATIVE
Ketones, UA: NEGATIVE
Leukocytes, UA: NEGATIVE
Nitrite, UA: NEGATIVE
Protein, UA: NEGATIVE
Spec Grav, UA: >=1.03 — AB (ref 1.010–1.025)
Urobilinogen, UA: 0.2 E.U./dL
pH, UA: 5.5 (ref 5.0–8.0)

## 2019-05-17 MED ORDER — BUTALBITAL-APAP-CAFFEINE 50-325-40 MG PO TABS: 1.0000 | ORAL_TABLET | Freq: Four times a day (QID) | ORAL | 0 refills | Status: AC | PRN

## 2019-05-17 MED ORDER — METOPROLOL SUCCINATE ER 25 MG PO TB24: 25.0000 mg | ORAL_TABLET | Freq: Every day | ORAL | 0 refills | Status: DC

## 2019-05-17 NOTE — Telephone Encounter (Signed)
Pt scheduled for testing at [facility] site on 05/17/19.

## 2019-05-17 NOTE — Patient Instructions (Addendum)
Plan:  We will get blood work today to see if we can figure out anything that might be causing you the dizziness/headache.  We will get you set up for drive-through COVID testing, though I do not have a strong suspicion for coronavirus illness it would be potentially helpful to rule this out.   Blood pressure is not particularly high, at least not high enough for me to worry about starting medications before I have all the other information back from the blood work and the other testing.    I sent a medication to the pharmacy to help with headache.  If this helps headache and your other symptoms resolved, nothing else to do.  I also sent a prescription that may help with palpitations and should also lower the blood pressure slightly.  Dizziness gets worse on this medication, stop it.  I'd like you to follow-up with Dr. Suzi Roots sometime next week. I will certainly let you know as soon as I have some more information from the labs.   If after hours or over the weekend you experience severe dizziness, especially if chest pain, shortness of breath, vision changes, unusual weakness, or other concerns please seek emergency medical attention!

## 2019-05-17 NOTE — Telephone Encounter (Signed)
Patient is having some headache, lightheadedness, body aches.  I do not have a strong suspicion for COVID infection but patient would like to get tested and I think this is reasonable given that we do not have any other clear explanation for her symptoms, can we get her set up for drive-through testing?  Thank you.

## 2019-05-17 NOTE — Progress Notes (Signed)
appt moved see other note from 05/17/19

## 2019-05-17 NOTE — Progress Notes (Signed)
HPI: Laura Johnston is a 48 y.o. female who  has a past medical history of Abnormal Pap smear, AMA (advanced maternal age) multigravida 74+, GERD (gastroesophageal reflux disease), Gestational diabetes, Prediabetes (01/23/2018), and Vaginal delivery (08/19/2011).  she presents to [facility] today, 05/17/19,  for chief complaint of:  Lightheaded, concern for blood pressure   Initial discussion this morning over virtual visit, was decided to come into the clinic for physical exam and orthostatic vitals, confirm home blood pressure monitor.  Patient reports about 4 days of intermittent dizziness described as lightheadedness upon position change especially with sitting to standing or walking around, resolves with sitting.  No chest pain or trouble breathing.  Does report headache, bilateral temporal area, described as throbbing.  Does not typically get headaches.  Patient's home blood pressure machine appears to be accurate, hers was 127/89 and ours was 121/85.  Patient's heart rate does get higher on orthostatic vital signs but no significant decrease in blood pressure.  On cardiac auscultation, questionable skipping beats/PVC.  Patient states that she does feel her heart racing on occasion, did not mention this until I asked after listening to her heart.  She states she does not get heart palpitations when she feels dizzy.     At today's visit 05/17/19 ... PMH, PSH, FH reviewed and updated as needed.  Current medication list and allergy/intolerance hx reviewed and updated as needed. (See remainder of HPI, ROS, Phys Exam below)   No results found.  Results for orders placed or performed in visit on 05/17/19 (from the past 72 hour(s))  POCT Urinalysis Dipstick     Status: Abnormal   Collection Time: 05/17/19 11:34 AM  Result Value Ref Range   Color, UA YELLOW    Clarity, UA CLEAR    Glucose, UA Negative Negative   Bilirubin, UA NEGATIVE    Ketones, UA NEGATIVE    Spec Grav, UA >=1.030 (A) 1.010 - 1.025   Blood, UA NEGATIVE    pH, UA 5.5 5.0 - 8.0   Protein, UA Negative Negative   Urobilinogen, UA 0.2 0.2 or 1.0 E.U./dL   Nitrite, UA NEGATIVE    Leukocytes, UA Negative Negative   Appearance     Odor       EKG interpretation: Rate: 81 Rhythm: sinus No ST/T changes concerning for acute ischemia/infarct  Previous EKG stablet IRBBB       ASSESSMENT/PLAN: The primary encounter diagnosis was Dizziness. A diagnosis of Palpitations was also pertinent to this visit.   Orders Placed This Encounter  Procedures  . COMPLETE METABOLIC PANEL WITH GFR  . TSH  . Magnesium  . CBC with Differential/Platelet  . POCT Urinalysis Dipstick  . EKG 12-Lead     Meds ordered this encounter  Medications  . metoprolol succinate (TOPROL-XL) 25 MG 24 hr tablet    Sig: Take 1 tablet (25 mg total) by mouth daily.    Dispense:  90 tablet    Refill:  0  . butalbital-acetaminophen-caffeine (FIORICET) 50-325-40 MG tablet    Sig: Take 1-2 tablets by mouth every 6 (six) hours as needed for headache.    Dispense:  5 tablet    Refill:  0    Patient Instructions  Plan:  We will get blood work today to see if we can figure out anything that might be causing you the dizziness/headache.  We will get you set up for drive-through COVID testing, though I do not have a strong suspicion for coronavirus illness  it would be potentially helpful to rule this out.   Blood pressure is not particularly high, at least not high enough for me to worry about starting medications before I have all the other information back from the blood work and the other testing.    I sent a medication to the pharmacy to help with headache.  If this helps headache and your other symptoms resolved, nothing else to do.  I also sent a prescription that may help with palpitations and should also lower the blood pressure slightly.  Dizziness gets worse on this medication, stop  it.  I'd like you to follow-up with Dr. Suzi Roots sometime next week. I will certainly let you know as soon as I have some more information from the labs.   If after hours or over the weekend you experience severe dizziness, especially if chest pain, shortness of breath, vision changes, unusual weakness, or other concerns please seek emergency medical attention!        Follow-up plan: Return for recheck w/ Dr Madilyn Fireman next week / depending on lab results. .                                                 ################################################# ################################################# ################################################# #################################################    Current Meds  Medication Sig  . blood glucose meter kit and supplies Dispense based on patient and insurance preference. Use once daily as directed. (FOR ICD- R73.03).  Marland Kitchen glucose blood test strip For testing blood sugars once a day. DX:R73.03  . loratadine (CLARITIN) 10 MG tablet Take 10 mg by mouth daily.    Allergies  Allergen Reactions  . Cortisone   . Hydrocortisone   . Meloxicam   . Meloxicam     Paralysis  . Prednisone     Bilateral legs swollen.  . Pseudoephedrine     REACTION: numbness to fingers/tongue       Review of Systems:  Constitutional: No recent illness  HEENT: +headache, no vision change  Cardiac: No  chest pain, No  pressure, +palpitations  Respiratory:  No  shortness of breath. No  Cough  Gastrointestinal: No  abdominal pain, no change on bowel habits  Musculoskeletal: No new myalgia/arthralgia  Skin: No  Rash  Neurologic: +weakness, +Dizziness  Psychiatric: No  concerns with depression, No  concerns with anxiety  Exam:  BP 121/85 (BP Location: Left Arm, Patient Position: Sitting, Cuff Size: Normal)   Pulse 83   Temp 98.1 F (36.7 C) (Oral)   Wt 196 lb 14.4 oz (89.3 kg)   LMP  10/25/2011   BMI 33.80 kg/m   Constitutional: VS see above. General Appearance: alert, well-developed, well-nourished, NAD  Eyes: Normal lids and conjunctive, non-icteric sclera  Ears, Nose, Mouth, Throat: MMM, Normal external inspection ears/nares/mouth/lips/gums. TM normal bilaterally   Neck: No masses, trachea midline. No lymphadenopathy.   Respiratory: Normal respiratory effort. no wheeze, no rhonchi, no rales  Cardiovascular: S1/S2 normal, no murmur, no rub/gallop auscultated. RRR.   Musculoskeletal: Gait normal. Symmetric and independent movement of all extremities  Neurological: Normal balance/coordination. No tremor.  Skin: warm, dry, intact.   Psychiatric: Normal judgment/insight. Normal mood and affect. Oriented x3.       Visit summary with medication list and pertinent instructions was printed for patient to review, patient was advised to alert [place] if any updates are needed. All questions at  time of visit were answered - patient instructed to contact office with any additional concerns. ER/RTC precautions were reviewed with the patient and understanding verbalized.   Note: Total time spent 40 minutes, greater than 50% of the visit was spent face-to-face counseling and coordinating care for the following: The primary encounter diagnosis was Dizziness. A diagnosis of Palpitations was also pertinent to this visit.Marland Kitchen  Please note: voice recognition software was used to produce this document, and typos may escape review. Please contact Dr. Sheppard Coil for any needed clarifications.    Follow up plan: Return for recheck w/ Dr Madilyn Fireman next week / depending on lab results. Marland Kitchen

## 2019-05-17 NOTE — Telephone Encounter (Signed)
Pt called and scheduled for testing at [facility] site on 05/17/19. Pt advised to wear a mask and to remain in car at appt time. Pt verbalized understanding.

## 2019-05-17 NOTE — Telephone Encounter (Signed)
Emeterio Reeve, DO 1 hour ago (12:04 PM)     Patient is having some headache, lightheadedness, body aches.  I do not have a strong suspicion for COVID infection but patient would like to get tested and I think this is reasonable given that we do not have any other clear explanation for her symptoms, can we get her set up for drive-through testing?  Thank you.

## 2019-05-18 LAB — COMPLETE METABOLIC PANEL WITH GFR
AG Ratio: 1.5 (calc) (ref 1.0–2.5)
ALT: 80 U/L — ABNORMAL HIGH (ref 6–29)
AST: 49 U/L — ABNORMAL HIGH (ref 10–35)
Albumin: 4.5 g/dL (ref 3.6–5.1)
Alkaline phosphatase (APISO): 68 U/L (ref 31–125)
BUN: 11 mg/dL (ref 7–25)
CO2: 29 mmol/L (ref 20–32)
Calcium: 9.7 mg/dL (ref 8.6–10.2)
Chloride: 103 mmol/L (ref 98–110)
Creat: 0.53 mg/dL (ref 0.50–1.10)
GFR, Est African American: 129 mL/min/{1.73_m2} (ref >=60)
GFR, Est Non African American: 111 mL/min/{1.73_m2} (ref >=60)
Globulin: 3.1 g/dL (calc) (ref 1.9–3.7)
Glucose, Bld: 94 mg/dL (ref 65–99)
Potassium: 4.6 mmol/L (ref 3.5–5.3)
Sodium: 139 mmol/L (ref 135–146)
Total Bilirubin: 0.5 mg/dL (ref 0.2–1.2)
Total Protein: 7.6 g/dL (ref 6.1–8.1)

## 2019-05-18 LAB — CBC WITH DIFFERENTIAL/PLATELET
Absolute Monocytes: 383 cells/uL (ref 200–950)
Basophils Absolute: 61 cells/uL (ref 0–200)
Basophils Relative: 1.2 %
Eosinophils Absolute: 199 cells/uL (ref 15–500)
Eosinophils Relative: 3.9 %
HCT: 41.4 % (ref 35.0–45.0)
Hemoglobin: 13.8 g/dL (ref 11.7–15.5)
Lymphs Abs: 2183 cells/uL (ref 850–3900)
MCH: 30.3 pg (ref 27.0–33.0)
MCHC: 33.3 g/dL (ref 32.0–36.0)
MCV: 90.8 fL (ref 80.0–100.0)
MPV: 11.2 fL (ref 7.5–12.5)
Monocytes Relative: 7.5 %
Neutro Abs: 2275 cells/uL (ref 1500–7800)
Neutrophils Relative %: 44.6 %
Platelets: 182 10*3/uL (ref 140–400)
RBC: 4.56 10*6/uL (ref 3.80–5.10)
RDW: 12.3 % (ref 11.0–15.0)
Total Lymphocyte: 42.8 %
WBC: 5.1 10*3/uL (ref 3.8–10.8)

## 2019-05-18 LAB — MAGNESIUM: Magnesium: 2.1 mg/dL (ref 1.5–2.5)

## 2019-05-18 LAB — TSH: TSH: 0.81 mIU/L

## 2019-05-22 ENCOUNTER — Other Ambulatory Visit: Payer: Self-pay | Admitting: Family Medicine

## 2019-05-22 ENCOUNTER — Ambulatory Visit: Payer: BC Managed Care – PPO | Admitting: Family Medicine

## 2019-05-22 ENCOUNTER — Encounter: Payer: Self-pay | Admitting: Family Medicine

## 2019-05-22 VITALS — BP 123/72 | HR 66 | Ht 64.0 in | Wt 195.0 lb

## 2019-05-22 DIAGNOSIS — R208 Other disturbances of skin sensation: Secondary | ICD-10-CM | POA: Diagnosis not present

## 2019-05-22 DIAGNOSIS — R42 Dizziness and giddiness: Secondary | ICD-10-CM

## 2019-05-22 DIAGNOSIS — R002 Palpitations: Secondary | ICD-10-CM | POA: Diagnosis not present

## 2019-05-22 DIAGNOSIS — L918 Other hypertrophic disorders of the skin: Secondary | ICD-10-CM | POA: Diagnosis not present

## 2019-05-22 DIAGNOSIS — K760 Fatty (change of) liver, not elsewhere classified: Secondary | ICD-10-CM

## 2019-05-22 DIAGNOSIS — R748 Abnormal levels of other serum enzymes: Secondary | ICD-10-CM

## 2019-05-22 LAB — NOVEL CORONAVIRUS, NAA: SARS-CoV-2, NAA: NOT DETECTED

## 2019-05-22 NOTE — Progress Notes (Signed)
New Patient Office Visit  Subjective:  Patient ID: Laura Johnston, female    DOB: 03-29-1971  Age: 48 y.o. MRN: [id number]  CC:  Chief Complaint  Patient presents with  . skin tags    HPI Laura Johnston presents for removal of several moles that she has around her neck.  She says her necklace catches on them.  She also has a couple on her upper anterior chest that her bra catches on and pinches and she would like those removed as well.  She was also seen by 1 of my partners last week complaining of intermittent dizziness/lightheadedness that occurred more with position change-.  She was also reporting headache which she described as throbbing.  And some palpitations.  Reported that her home blood pressures look great.  The provider that saw her thought she might be having some PVCs and decided to that encouraged her to try a low-dose beta-blocker.  She said she did pick it up but never actually took it.  Says she is been paying more attention to the episodes and feels like they seem to occur after she eats usually about half an hour afterwards.  Or if she forgets to eat and then it gets late and it is been a long time since she was initially started to feel that way.  Past Medical History:  Diagnosis Date  . Abnormal Pap smear   . AMA (advanced maternal age) multigravida 16+   . GERD (gastroesophageal reflux disease)   . Gestational diabetes   . Prediabetes 01/23/2018  . Vaginal delivery 08/19/2011    Past Surgical History:  Procedure Laterality Date  . APPENDECTOMY    . KNEE SURGERY    . LEEP    . TUMOR REMOVAL     from buttocks    Family History  Problem Relation Age of Onset  . Stroke Father   . Hypertension Father   . Hyperlipidemia Father   . Heart disease Father   . Diabetes Maternal Uncle   . Stroke Paternal Grandmother     Social History   Socioeconomic History  . Marital status: Single    Spouse name: Not on file  . Number of children: Not on  file  . Years of education: Not on file  . Highest education level: Not on file  Occupational History  . Not on file  Social Needs  . Financial resource strain: Not on file  . Food insecurity    Worry: Not on file    Inability: Not on file  . Transportation needs    Medical: Not on file    Non-medical: Not on file  Tobacco Use  . Smoking status: Former Smoker    Years: 2.00    Types: Cigarettes    Quit date: 01/23/2016    Years since quitting: 3.3  . Smokeless tobacco: Never Used  . Tobacco comment: 1-3 cig/day  Substance and Sexual Activity  . Alcohol use: No  . Drug use: No  . Sexual activity: Never  Lifestyle  . Physical activity    Days per week: Not on file    Minutes per session: Not on file  . Stress: Not on file  Relationships  . Social Herbalist on phone: Not on file    Gets together: Not on file    Attends religious service: Not on file    Active member of club or organization: Not on file    Attends meetings of clubs or organizations: Not  on file    Relationship status: Not on file  . Intimate partner violence    Fear of current or ex partner: Not on file    Emotionally abused: Not on file    Physically abused: Not on file    Forced sexual activity: Not on file  Other Topics Concern  . Not on file  Social History Narrative  . Not on file    ROS Review of Systems  Objective:   Today's Vitals: BP 123/72   Pulse 66   Ht '5\' 4"'  (1.626 m)   Wt 195 lb (88.5 kg)   LMP 10/25/2011   SpO2 98%   BMI 33.47 kg/m   Physical Exam  Assessment & Plan:   Problem List Items Addressed This Visit    None    Visit Diagnoses    Multiple acquired skin tags    -  Primary   Dizziness       Palpitations       Fatty liver       Relevant Orders   Amb ref to Medical Nutrition Therapy-MNT   US ABDOMEN COMPLETE W/ELASTOGRAPHY   Elevated liver enzymes       Relevant Orders   US ABDOMEN COMPLETE W/ELASTOGRAPHY     Elevated liver enzymes-most likely  from fatty liver she does have a prior diagnosis.  I do think working with a nutritionist would be really helpful for her.  She says she is been trying to lose weight but just has not been very successful so we also discussed possible weight loss medication as an option.  Also will plan to schedule for an ultrasound for evaluation of the liver.  Dizziness/palpitations-unclear etiology.  We discussed checking blood pressure daily.  Also encouraged her to start checking her glucose because she notices it happens usually right after she eats.  Urged her to consider keeping a food diary to see if she might find if there are specific triggers.  Also encouraged her to avoid all artificial sugars.  Subjective:     Tyliyah Johnston is a 48 y.o. female who complains of skin tags. The patient wishes skin tag removed as the lesion is getting caught on clothing and/or jewelry and is recurrently irritated.   The following portions of the patient's history were reviewed and updated as appropriate: allergies, current medications, past family history, past medical history, past social history, past surgical history and problem list.  Review of Systems A comprehensive review of systems was negative.    Objective:    Skin:  15 skin tag in the neck and thorax region, were removed using scissors and forceps after alcohol prep; hemostasis is obtained with drysol and discarded.    Assessment:    Chronically irritated skin tag, removed.    Plan:     1. The patient is instructed to watch for signs of infection including erythema, pain,      purulent discharge, or crusting.  2. Verbal patient instruction given. 3. Follow up as needed for acute illness.   Outpatient Encounter Medications as of 05/22/2019  Medication Sig  . butalbital-acetaminophen-caffeine (FIORICET) 50-325-40 MG tablet Take 1-2 tablets by mouth every 6 (six) hours as needed for headache.  . blood glucose meter kit and supplies Dispense  based on patient and insurance preference. Use once daily as directed. (FOR ICD- R73.03).  Marland Kitchen glucose blood test strip For testing blood sugars once a day. DX:R73.03  . metoprolol succinate (TOPROL-XL) 25 MG 24 hr tablet Take 1 tablet (  25 mg total) by mouth daily. (Patient not taking: Reported on 05/22/2019)  . [DISCONTINUED] loratadine (CLARITIN) 10 MG tablet Take 10 mg by mouth daily.   No facility-administered encounter medications on file as of 05/22/2019.     Follow-up: Return if symptoms worsen or fail to improve.   Beatrice Lecher, MD

## 2019-05-22 NOTE — Patient Instructions (Signed)
Check your BP daily. Check your glucose in the morning fasting and then after one meal a day when you feel bad.   Consider keeping a food diary to find out if certain foods are triggering this.   Also recommend avoid all artificial sugar.

## 2019-05-28 ENCOUNTER — Ambulatory Visit: Payer: BC Managed Care – PPO | Admitting: Family Medicine

## 2019-05-29 DIAGNOSIS — Z6834 Body mass index (BMI) 34.0-34.9, adult: Secondary | ICD-10-CM | POA: Diagnosis not present

## 2019-05-29 DIAGNOSIS — Z1231 Encounter for screening mammogram for malignant neoplasm of breast: Secondary | ICD-10-CM | POA: Diagnosis not present

## 2019-05-29 DIAGNOSIS — Z01419 Encounter for gynecological examination (general) (routine) without abnormal findings: Secondary | ICD-10-CM | POA: Diagnosis not present

## 2019-05-29 LAB — HM PAP SMEAR: HM Pap smear: NEGATIVE

## 2019-05-30 ENCOUNTER — Other Ambulatory Visit: Payer: Self-pay | Admitting: Obstetrics and Gynecology

## 2019-05-30 DIAGNOSIS — N644 Mastodynia: Secondary | ICD-10-CM

## 2019-06-05 ENCOUNTER — Other Ambulatory Visit: Payer: Self-pay | Admitting: Obstetrics and Gynecology

## 2019-06-05 ENCOUNTER — Ambulatory Visit
Admission: RE | Admit: 2019-06-05 | Discharge: 2019-06-05 | Disposition: A | Discharge status: Home / Self Care | Payer: BC Managed Care – PPO | Source: Ambulatory Visit | Attending: Obstetrics and Gynecology | Admitting: Obstetrics and Gynecology

## 2019-06-05 ENCOUNTER — Other Ambulatory Visit: Payer: Self-pay

## 2019-06-05 DIAGNOSIS — N644 Mastodynia: Secondary | ICD-10-CM

## 2019-06-05 DIAGNOSIS — R928 Other abnormal and inconclusive findings on diagnostic imaging of breast: Secondary | ICD-10-CM | POA: Diagnosis not present

## 2019-06-18 DIAGNOSIS — K760 Fatty (change of) liver, not elsewhere classified: Secondary | ICD-10-CM | POA: Diagnosis not present

## 2019-06-21 ENCOUNTER — Other Ambulatory Visit: Payer: Self-pay

## 2019-06-21 ENCOUNTER — Ambulatory Visit
Admission: RE | Admit: 2019-06-21 | Discharge: 2019-06-21 | Disposition: A | Discharge status: Home / Self Care | Payer: BC Managed Care – PPO | Source: Ambulatory Visit | Attending: Family Medicine | Admitting: Family Medicine

## 2019-06-21 DIAGNOSIS — K760 Fatty (change of) liver, not elsewhere classified: Secondary | ICD-10-CM | POA: Diagnosis not present

## 2019-06-21 DIAGNOSIS — R748 Abnormal levels of other serum enzymes: Secondary | ICD-10-CM

## 2019-06-25 ENCOUNTER — Encounter: Payer: Self-pay | Admitting: Family Medicine

## 2019-06-25 ENCOUNTER — Other Ambulatory Visit: Payer: Self-pay

## 2019-06-25 ENCOUNTER — Ambulatory Visit (INDEPENDENT_AMBULATORY_CARE_PROVIDER_SITE_OTHER): Payer: BC Managed Care – PPO | Admitting: Family Medicine

## 2019-06-25 VITALS — BP 139/84 | HR 92 | Ht 64.0 in | Wt 199.0 lb

## 2019-06-25 DIAGNOSIS — H938X3 Other specified disorders of ear, bilateral: Secondary | ICD-10-CM

## 2019-06-25 DIAGNOSIS — R9431 Abnormal electrocardiogram [ECG] [EKG]: Secondary | ICD-10-CM

## 2019-06-25 DIAGNOSIS — M542 Cervicalgia: Secondary | ICD-10-CM

## 2019-06-25 DIAGNOSIS — M549 Dorsalgia, unspecified: Secondary | ICD-10-CM

## 2019-06-25 DIAGNOSIS — R0789 Other chest pain: Secondary | ICD-10-CM | POA: Diagnosis not present

## 2019-06-25 LAB — CK: Total CK: 77 U/L (ref 29–143)

## 2019-06-25 LAB — COMPLETE METABOLIC PANEL WITH GFR
AG Ratio: 1.6 (calc) (ref 1.0–2.5)
ALT: 62 U/L — ABNORMAL HIGH (ref 6–29)
AST: 41 U/L — ABNORMAL HIGH (ref 10–35)
Albumin: 4.5 g/dL (ref 3.6–5.1)
Alkaline phosphatase (APISO): 62 U/L (ref 31–125)
BUN: 16 mg/dL (ref 7–25)
CO2: 26 mmol/L (ref 20–32)
Calcium: 9.5 mg/dL (ref 8.6–10.2)
Chloride: 104 mmol/L (ref 98–110)
Creat: 0.62 mg/dL (ref 0.50–1.10)
GFR, Est African American: 123 mL/min/{1.73_m2} (ref >=60)
GFR, Est Non African American: 106 mL/min/{1.73_m2} (ref >=60)
Globulin: 2.8 g/dL (calc) (ref 1.9–3.7)
Glucose, Bld: 115 mg/dL — ABNORMAL HIGH (ref 65–99)
Potassium: 3.7 mmol/L (ref 3.5–5.3)
Sodium: 136 mmol/L (ref 135–146)
Total Bilirubin: 0.4 mg/dL (ref 0.2–1.2)
Total Protein: 7.3 g/dL (ref 6.1–8.1)

## 2019-06-25 LAB — CBC
HCT: 40.2 % (ref 35.0–45.0)
Hemoglobin: 13.3 g/dL (ref 11.7–15.5)
MCH: 29.7 pg (ref 27.0–33.0)
MCHC: 33.1 g/dL (ref 32.0–36.0)
MCV: 89.7 fL (ref 80.0–100.0)
MPV: 11.4 fL (ref 7.5–12.5)
Platelets: 185 10*3/uL (ref 140–400)
RBC: 4.48 10*6/uL (ref 3.80–5.10)
RDW: 12.3 % (ref 11.0–15.0)
WBC: 5.9 10*3/uL (ref 3.8–10.8)

## 2019-06-25 LAB — TROPONIN I: Troponin I: <0.01 ng/mL (ref <=0.0)

## 2019-06-25 LAB — D-DIMER, QUANTITATIVE: D-Dimer, Quant: <0.19 mcg/mL FEU (ref <0.50)

## 2019-06-25 MED ORDER — PREDNISONE 20 MG PO TABS: 40.0000 mg | ORAL_TABLET | Freq: Every day | ORAL | 0 refills | Status: DC

## 2019-06-25 MED ORDER — CYCLOBENZAPRINE HCL 10 MG PO TABS: 10.0000 mg | ORAL_TABLET | Freq: Three times a day (TID) | ORAL | 0 refills | Status: DC | PRN

## 2019-06-25 MED ORDER — METHYLPREDNISOLONE ACETATE 40 MG/ML IJ SUSP
40.0000 mg | Freq: Once | INTRAMUSCULAR | Status: AC
  Administered 2019-06-25: 16:00:00 40 mg via INTRAMUSCULAR

## 2019-06-25 NOTE — Progress Notes (Signed)
Acute Office Visit  Subjective:    Patient ID: Laura Johnston, female    DOB: 03-Oct-1971, 48 y.o.   MRN: [id number]  Chief Complaint  Patient presents with  . Neck Pain    pt reports that her sxs began this morning she felt the pain when she woke up. she stated that she was reaching for a shirt and felt the pain on the L side of her neck/shoulder area. denies any sob or cp.  . Ear Pain    HPI Patient is in today for upper back pain.  She says yesterday she noticed some left mid back pain.  She said she was up cooking and was in some discomfort.  She went to bed and then this morning when she woke up she felt better but then she was reaching up to get a shirt and started to feel some pain across her left upper back.  She says as the day has gone on it is gotten worse now she has pain over the upper back bilaterally and also into her upper chest bilaterally.  She gets the pain going up into the left side of her neck that she describes as a stiffness of is going into her ear and behind the ear.  She was feels like her ears are full.  She denies any cough or shortness of breath.  She denies any fevers or chills or recent upper respiratory symptoms.  She has been burping a lot today.  NO URI sxs.  She also reports some pain in her left hand.    Past Medical History:  Diagnosis Date  . Abnormal Pap smear   . AMA (advanced maternal age) multigravida 50+   . GERD (gastroesophageal reflux disease)   . Gestational diabetes   . Prediabetes 01/23/2018  . Vaginal delivery 08/19/2011    Past Surgical History:  Procedure Laterality Date  . APPENDECTOMY    . KNEE SURGERY    . LEEP    . TUMOR REMOVAL     from buttocks    Family History  Problem Relation Age of Onset  . Stroke Father   . Hypertension Father   . Hyperlipidemia Father   . Heart disease Father   . Diabetes Maternal Uncle   . Stroke Paternal Grandmother     Social History   Socioeconomic History  . Marital status:  Single    Spouse name: Not on file  . Number of children: Not on file  . Years of education: Not on file  . Highest education level: Not on file  Occupational History  . Not on file  Social Needs  . Financial resource strain: Not on file  . Food insecurity    Worry: Not on file    Inability: Not on file  . Transportation needs    Medical: Not on file    Non-medical: Not on file  Tobacco Use  . Smoking status: Former Smoker    Years: 2.00    Types: Cigarettes    Quit date: 01/23/2016    Years since quitting: 3.4  . Smokeless tobacco: Never Used  . Tobacco comment: 1-3 cig/day  Substance and Sexual Activity  . Alcohol use: No  . Drug use: No  . Sexual activity: Never  Lifestyle  . Physical activity    Days per week: Not on file    Minutes per session: Not on file  . Stress: Not on file  Relationships  . Social Herbalist on  phone: Not on file    Gets together: Not on file    Attends religious service: Not on file    Active member of club or organization: Not on file    Attends meetings of clubs or organizations: Not on file    Relationship status: Not on file  . Intimate partner violence    Fear of current or ex partner: Not on file    Emotionally abused: Not on file    Physically abused: Not on file    Forced sexual activity: Not on file  Other Topics Concern  . Not on file  Social History Narrative  . Not on file    Outpatient Medications Prior to Visit  Medication Sig Dispense Refill  . blood glucose meter kit and supplies Dispense based on patient and insurance preference. Use once daily as directed. (FOR ICD- R73.03). 1 each 0  . butalbital-acetaminophen-caffeine (FIORICET) 50-325-40 MG tablet Take 1-2 tablets by mouth every 6 (six) hours as needed for headache. 5 tablet 0  . glucose blood test strip For testing blood sugars once a day. DX:R73.03 100 each 11  . Lancets (ONETOUCH DELICA PLUS [id number]) MISC USE AS DIRECTED 100 each 11  . metoprolol  succinate (TOPROL-XL) 25 MG 24 hr tablet Take 1 tablet (25 mg total) by mouth daily. 90 tablet 0  . glucose blood test strip OneTouch Ultra Blue Test Strip  FOR TESTING BLOOD SUGARS ONCE A DAY. DX R73.03     No facility-administered medications prior to visit.     Allergies  Allergen Reactions  . Cortisone   . Hydrocortisone   . Meloxicam   . Meloxicam     Paralysis  . Prednisone     Bilateral legs swollen.  . Pseudoephedrine     REACTION: numbness to fingers/tongue    ROS     Objective:    Physical Exam  Constitutional: She is oriented to person, place, and time. She appears well-developed and well-nourished.  HENT:  Head: Normocephalic and atraumatic.  Right Ear: External ear normal.  Left Ear: External ear normal.  TMs and canals are clear.   Cardiovascular: Normal rate, regular rhythm and normal heart sounds.  Pulmonary/Chest: Effort normal and breath sounds normal.  Musculoskeletal:     Comments: Neck with normal cervical flexion extension rotation right and left and side bending.  Shoulders with normal range of motion as well.  Negative empty can test.  Though she did have pain radiating up into her neck with that particular maneuver.  She had some tenderness over the anterior chest wall bilaterally.  Also tenderness between the left scapula and the cervical spine.  She was also tender over the left paraspinous muscles.  Neurological: She is alert and oriented to person, place, and time.  Skin: Skin is warm and dry.  Psychiatric: She has a normal mood and affect. Her behavior is normal.    BP 139/84   Pulse 92   Ht 5' 4" (1.626 m)   Wt 199 lb (90.3 kg)   LMP 10/25/2011   SpO2 98%   BMI 34.16 kg/m  Wt Readings from Last 3 Encounters:  06/25/19 199 lb (90.3 kg)  05/22/19 195 lb (88.5 kg)  05/17/19 196 lb 14.4 oz (89.3 kg)    There are no preventive care reminders to display for this patient.  There are no preventive care reminders to display for this  patient.   Lab Results  Component Value Date   TSH 0.81 05/17/2019  Lab Results  Component Value Date   WBC 5.1 05/17/2019   HGB 13.8 05/17/2019   HCT 41.4 05/17/2019   MCV 90.8 05/17/2019   PLT 182 05/17/2019   Lab Results  Component Value Date   NA 139 05/17/2019   K 4.6 05/17/2019   CO2 29 05/17/2019   GLUCOSE 94 05/17/2019   BUN 11 05/17/2019   CREATININE 0.53 05/17/2019   BILITOT 0.5 05/17/2019   ALKPHOS 54 05/10/2017   AST 49 (H) 05/17/2019   ALT 80 (H) 05/17/2019   PROT 7.6 05/17/2019   ALBUMIN 4.2 05/10/2017   CALCIUM 9.7 05/17/2019   Lab Results  Component Value Date   CHOL 221 (A) 03/31/2015   Lab Results  Component Value Date   HDL 47 03/31/2015   Lab Results  Component Value Date   LDLCALC 129 03/31/2015   Lab Results  Component Value Date   TRIG 225 (A) 03/31/2015   Lab Results  Component Value Date   CHOLHDL 4.4 Ratio 12/22/2009   Lab Results  Component Value Date   HGBA1C 5.7 (H) 01/22/2018       Assessment & Plan:   Problem List Items Addressed This Visit    None    Visit Diagnoses    Atypical chest pain    -  Primary   Relevant Orders   EKG 12-Lead   CBC   COMPLETE METABOLIC PANEL WITH GFR   Troponin I   CK (Creatine Kinase)   D-dimer, quantitative (not at [facility])   Mid back pain on left side       Relevant Medications   methylPREDNISolone acetate (DEPO-MEDROL) injection 40 mg (Completed)   cyclobenzaprine (FLEXERIL) 10 MG tablet   Other Relevant Orders   CBC   COMPLETE METABOLIC PANEL WITH GFR   Troponin I   CK (Creatine Kinase)   D-dimer, quantitative (not at [facility])   Ear pressure, bilateral       Neck pain on left side       Relevant Medications   methylPREDNISolone acetate (DEPO-MEDROL) injection 40 mg (Completed)   Other Relevant Orders   CBC   COMPLETE METABOLIC PANEL WITH GFR   Troponin I   CK (Creatine Kinase)   D-dimer, quantitative (not at [facility])   Abnormal EKG         Left upper back pain/neck  pain-may be musculoskeletal but this just strange to me that it started with just reaching out for her shirt and it seems odd that she is having pain all the way across her chest.  We will treat her with prednisone and muscle relaxer.  She says she does not tolerate oral prednisone well so we gave her Depo-Medrol.  Atypical chest pain-it is mostly discomfort in the upper half of the chest not in the lower half or midsternal area.  We did do an EKG today.  EKG shows rate of 88 bpm, normal sinus rhythm with incomplete right bundle branch block.  I did not see the bundle branch block in previous EKG done earlier this year.  Thus we will get cardiac enzymes including troponin and a CBC.  Call with results once available.  If at any point she gets any increase in chest pain and I want her to go the emergency department.  We will also check for elevated d-dimer though she denies any shortness of breath.  Abnormal EKG-we will do further work-up and consider cardiology consultation.  Meds ordered this encounter  Medications  . DISCONTD: predniSONE (  DELTASONE) 20 MG tablet    Sig: Take 2 tablets (40 mg total) by mouth daily with breakfast.    Dispense:  10 tablet    Refill:  0  . methylPREDNISolone acetate (DEPO-MEDROL) injection 40 mg  . cyclobenzaprine (FLEXERIL) 10 MG tablet    Sig: Take 1 tablet (10 mg total) by mouth 3 (three) times daily as needed for muscle spasms.    Dispense:  30 tablet    Refill:  0     Beatrice Lecher, MD

## 2019-07-24 DIAGNOSIS — K760 Fatty (change of) liver, not elsewhere classified: Secondary | ICD-10-CM | POA: Diagnosis not present

## 2019-07-30 DIAGNOSIS — H5203 Hypermetropia, bilateral: Secondary | ICD-10-CM | POA: Diagnosis not present

## 2019-09-02 NOTE — Addendum Note (Signed)
Addended by: Alena Bills R on: 09/02/2019 01:40 PM   Modules accepted: Orders

## 2019-11-05 ENCOUNTER — Telehealth: Payer: Self-pay | Admitting: Family Medicine

## 2019-11-05 NOTE — Telephone Encounter (Signed)
Patient has an appt 11/06/19 with Dr.T for left knee pain and swelling and she wanted me to ask what she can take over the counter for this until she comes tomorrow morning at 0830. She would like for someone clinical to MyChart message her back with that advice. Thanks

## 2019-11-05 NOTE — Telephone Encounter (Signed)
Yes, she can use over-the-counter analgesics.  Arthritis strength Tylenol 650 mg 3 times daily to start out with.

## 2019-11-05 NOTE — Telephone Encounter (Signed)
Sent pt MyChart with recommendations

## 2019-11-05 NOTE — Telephone Encounter (Signed)
Just as FYI:  Patient has x-ray of right knee from 01/06/2017 and it revealed metal in the knee. No MRI was done.   Preventatively called imaging today to see if she would be safe for MRI, since HX of right knee pain and current c/o left knee pain.  Bonnita Nasuti is looking into this and will let me know.

## 2019-11-06 ENCOUNTER — Encounter: Payer: Self-pay | Admitting: Sports Medicine

## 2019-11-06 ENCOUNTER — Ambulatory Visit (INDEPENDENT_AMBULATORY_CARE_PROVIDER_SITE_OTHER): Payer: BC Managed Care – PPO

## 2019-11-06 ENCOUNTER — Other Ambulatory Visit: Payer: Self-pay

## 2019-11-06 ENCOUNTER — Ambulatory Visit (INDEPENDENT_AMBULATORY_CARE_PROVIDER_SITE_OTHER): Payer: BC Managed Care – PPO | Admitting: Sports Medicine

## 2019-11-06 DIAGNOSIS — M25761 Osteophyte, right knee: Secondary | ICD-10-CM | POA: Diagnosis not present

## 2019-11-06 DIAGNOSIS — M25562 Pain in left knee: Secondary | ICD-10-CM | POA: Diagnosis not present

## 2019-11-06 DIAGNOSIS — M170 Bilateral primary osteoarthritis of knee: Secondary | ICD-10-CM

## 2019-11-06 MED ORDER — CELECOXIB 200 MG PO CAPS: ORAL_CAPSULE | ORAL | 2 refills | Status: DC

## 2019-11-06 NOTE — Telephone Encounter (Signed)
Spoke with imaging- if needed it IS ok for patient to  Have MRI on left knee even tho she does have metal in right knee.   Just FYI for provider

## 2019-11-06 NOTE — Progress Notes (Signed)
Subjective:    CC: Left knee pain  HPI: This is a pleasant 48 year old female, after putting up Christmas decorations she noted some stiffness in her knee, subsequently it swelled up, she had immediate pain, a limp, gelling.  She has mechanical symptoms on the lateral aspect which have improved considerably, overall things are doing much better today.  I reviewed the past medical history, family history, social history, surgical history, and allergies today and no changes were needed.  Please see the problem list section below in epic for further details.  Past Medical History: Past Medical History:  Diagnosis Date  . Abnormal Pap smear   . AMA (advanced maternal age) multigravida 73+   . GERD (gastroesophageal reflux disease)   . Gestational diabetes   . Prediabetes 01/23/2018  . Vaginal delivery 08/19/2011   Past Surgical History: Past Surgical History:  Procedure Laterality Date  . APPENDECTOMY    . KNEE SURGERY    . LEEP    . TUMOR REMOVAL     from buttocks   Social History: Social History   Socioeconomic History  . Marital status: Single    Spouse name: Not on file  . Number of children: Not on file  . Years of education: Not on file  . Highest education level: Not on file  Occupational History  . Not on file  Social Needs  . Financial resource strain: Not on file  . Food insecurity    Worry: Not on file    Inability: Not on file  . Transportation needs    Medical: Not on file    Non-medical: Not on file  Tobacco Use  . Smoking status: Former Smoker    Years: 2.00    Types: Cigarettes    Quit date: 01/23/2016    Years since quitting: 3.7  . Smokeless tobacco: Never Used  . Tobacco comment: 1-3 cig/day  Substance and Sexual Activity  . Alcohol use: No  . Drug use: No  . Sexual activity: Never  Lifestyle  . Physical activity    Days per week: Not on file    Minutes per session: Not on file  . Stress: Not on file  Relationships  . Social Product manager on phone: Not on file    Gets together: Not on file    Attends religious service: Not on file    Active member of club or organization: Not on file    Attends meetings of clubs or organizations: Not on file    Relationship status: Not on file  Other Topics Concern  . Not on file  Social History Narrative  . Not on file   Family History: Family History  Problem Relation Age of Onset  . Stroke Father   . Hypertension Father   . Hyperlipidemia Father   . Heart disease Father   . Diabetes Maternal Uncle   . Stroke Paternal Grandmother    Allergies: Allergies  Allergen Reactions  . Cortisone   . Hydrocortisone   . Meloxicam   . Meloxicam     Paralysis  . Prednisone     Bilateral legs swollen.  . Pseudoephedrine     REACTION: numbness to fingers/tongue   Medications: See med rec.  Review of Systems: No fevers, chills, night sweats, weight loss, chest pain, or shortness of breath.   Objective:    General: Well Developed, well nourished, and in no acute distress.  Neuro: Alert and oriented x3, extra-ocular muscles intact, sensation grossly intact.  HEENT: Normocephalic, atraumatic, pupils equal round reactive to light, neck supple, no masses, no lymphadenopathy, thyroid nonpalpable.  Skin: Warm and dry, no rashes. Cardiac: Regular rate and rhythm, no murmurs rubs or gallops, no lower extremity edema.  Respiratory: Clear to auscultation bilaterally. Not using accessory muscles, speaking in full sentences. Left knee: Only trace swelling, tenderness at the medial joint line. ROM normal in flexion and extension and lower leg rotation. Ligaments with solid consistent endpoints including ACL, PCL, LCL, MCL. Negative Mcmurray's and provocative meniscal tests. Non painful patellar compression. Patellar and quadriceps tendons unremarkable. Hamstring and quadriceps strength is normal.  Impression and Recommendations:    Primary osteoarthritis of both knees Right knee  is continuing to do well after Monovisc injection in December 2018. Left knee is hurting now, medial joint line, mild swelling. X-rays, restart Celebrex, rehab exercises given, ice 20 minutes 3-4 times a day. Return to see me in 1 month, injection if no better.   ___________________________________________ Gwen Her. Dianah Field, M.D., ABFM., CAQSM. Primary Care and Sports Medicine [place] [facility]  Adjunct Professor of [place] [employer] of Medicine

## 2019-11-06 NOTE — Assessment & Plan Note (Signed)
Right knee is continuing to do well after Monovisc injection in December 2018. Left knee is hurting now, medial joint line, mild swelling. X-rays, restart Celebrex, rehab exercises given, ice 20 minutes 3-4 times a day. Return to see me in 1 month, injection if no better.

## 2019-12-04 ENCOUNTER — Ambulatory Visit: Payer: BC Managed Care – PPO | Admitting: Sports Medicine

## 2019-12-04 ENCOUNTER — Other Ambulatory Visit: Payer: Self-pay

## 2019-12-04 DIAGNOSIS — M170 Bilateral primary osteoarthritis of knee: Secondary | ICD-10-CM | POA: Diagnosis not present

## 2019-12-04 DIAGNOSIS — G4762 Sleep related leg cramps: Secondary | ICD-10-CM

## 2019-12-04 MED ORDER — MAGNESIUM OXIDE 400 MG PO TABS: 800.0000 mg | ORAL_TABLET | Freq: Every day | ORAL | 3 refills | Status: DC

## 2019-12-04 NOTE — Assessment & Plan Note (Signed)
Significant cramping in both legs at night. Starting magnesium oxide at bedtime. If insufficient improvement we will further image her lumbar spine looking for spinal stenosis and neurogenic claudication, she does have a strong family history of vascular claudication so at that time we would probably also pull the trigger for ABIs.

## 2019-12-04 NOTE — Assessment & Plan Note (Signed)
Right knee continues to do well after Monovisc injection in December 2018. Unfortunately she continues to have pain in her left knee, at the last visit we added Celebrex, rehab exercises, today she continues to have pain so we injected and aspirated her left knee. Return to see me in a month.

## 2019-12-04 NOTE — Progress Notes (Signed)
    Procedures performed today:    Procedure: Real-time Ultrasound Guided  aspiration/injection of left knee Device: Samsung [id number]  Verbal informed consent obtained.  Time-out conducted.  Noted no overlying erythema, induration, or other signs of local infection.  Skin prepped in a sterile fashion.  Local anesthesia: Topical Ethyl chloride.  With sterile technique and under real time ultrasound guidance:  Using an 18-gauge needle aspirated 70 cc of clear, straw-colored fluid, syringe switched and 1 cc Kenalog 40, 2 cc lidocaine, 2 cc bupivacaine injected easily Completed without difficulty  Pain immediately resolved suggesting accurate placement of the medication.  Advised to call if fevers/chills, erythema, induration, drainage, or persistent bleeding.  Images permanently stored and available for review in the ultrasound unit.  Impression: Technically successful ultrasound guided injection.  Independent interpretation of tests performed by another provider:   None.  Impression and Recommendations:    Primary osteoarthritis of both knees Right knee continues to do well after Monovisc injection in December 2018. Unfortunately she continues to have pain in her left knee, at the last visit we added Celebrex, rehab exercises, today she continues to have pain so we injected and aspirated her left knee. Return to see me in a month.   Cramp in lower extremity associated with sleep Significant cramping in both legs at night. Starting magnesium oxide at bedtime. If insufficient improvement we will further image her lumbar spine looking for spinal stenosis and neurogenic claudication, she does have a strong family history of vascular claudication so at that time we would probably also pull the trigger for ABIs.    ___________________________________________ Gwen Her. Dianah Field, M.D., ABFM., CAQSM. Primary Care and [place]  Instructor of [place] [employer] [facility] of Medicine

## 2019-12-24 ENCOUNTER — Encounter: Payer: Self-pay | Admitting: Family Medicine

## 2019-12-25 NOTE — Telephone Encounter (Signed)
HI Laura Johnston can you look at this and see if charges were denied?

## 2020-01-01 ENCOUNTER — Telehealth: Payer: Self-pay | Admitting: Sports Medicine

## 2020-01-01 NOTE — Telephone Encounter (Signed)
Medication was approved  Case ID [id number] Valid 12/02/19 - 12/31/20

## 2020-01-01 NOTE — Telephone Encounter (Signed)
Received fax for prior authorization on Celexicob sent through cover my meds waiting on determination. - CF

## 2020-01-03 ENCOUNTER — Other Ambulatory Visit: Payer: Self-pay

## 2020-01-03 ENCOUNTER — Ambulatory Visit: Payer: BC Managed Care – PPO | Admitting: Sports Medicine

## 2020-01-03 ENCOUNTER — Ambulatory Visit (INDEPENDENT_AMBULATORY_CARE_PROVIDER_SITE_OTHER): Payer: BC Managed Care – PPO

## 2020-01-03 DIAGNOSIS — M170 Bilateral primary osteoarthritis of knee: Secondary | ICD-10-CM

## 2020-01-03 DIAGNOSIS — M48062 Spinal stenosis, lumbar region with neurogenic claudication: Secondary | ICD-10-CM

## 2020-01-03 DIAGNOSIS — M4807 Spinal stenosis, lumbosacral region: Secondary | ICD-10-CM | POA: Diagnosis not present

## 2020-01-03 DIAGNOSIS — R200 Anesthesia of skin: Secondary | ICD-10-CM | POA: Diagnosis not present

## 2020-01-03 DIAGNOSIS — M48061 Spinal stenosis, lumbar region without neurogenic claudication: Secondary | ICD-10-CM | POA: Insufficient documentation

## 2020-01-03 DIAGNOSIS — G9520 Unspecified cord compression: Secondary | ICD-10-CM | POA: Insufficient documentation

## 2020-01-03 DIAGNOSIS — M47816 Spondylosis without myelopathy or radiculopathy, lumbar region: Secondary | ICD-10-CM | POA: Diagnosis not present

## 2020-01-03 DIAGNOSIS — G4762 Sleep related leg cramps: Secondary | ICD-10-CM | POA: Diagnosis not present

## 2020-01-03 MED ORDER — HYDROCODONE-ACETAMINOPHEN 5-325 MG PO TABS: 1.0000 | ORAL_TABLET | Freq: Three times a day (TID) | ORAL | 0 refills | Status: DC | PRN

## 2020-01-03 NOTE — Assessment & Plan Note (Signed)
Nocturnal cramping has improved considerably with magnesium oxide but she continues to have right L4 distribution radiculitis, see below for further details.

## 2020-01-03 NOTE — Progress Notes (Signed)
    Procedures performed today:    None.  Independent interpretation of tests performed by another provider:   None.  Impression and Recommendations:    Lumbar spinal stenosis Laura Johnston returns, she is a pleasant 49 year old female with low back pain radiating down the right leg in an L4 distribution. She does get cramping in her legs when walking and at night consistent with neurogenic claudication. Cramping improved considerably with nighttime magnesium oxide. Because she has at this point failed greater than 6 weeks of conservative measures we are going to proceed with an MRI for epidural planning. Adding a short course of hydrocodone to use in the meantime. We do need to go ahead and get some x-rays today.  Cramp in lower extremity associated with sleep Nocturnal cramping has improved considerably with magnesium oxide but she continues to have right L4 distribution radiculitis, see below for further details.  Primary osteoarthritis of both knees Right knee continues to do well after a Monovisc injection in December 2018. At the last visit I injected events left knee, added Celebrex, her knee is pain-free now. Return as needed.    ___________________________________________ Gwen Her. Dianah Field, M.D., ABFM., CAQSM. Primary Care and [place] Instructor of [place] [employer] [facility] of Medicine

## 2020-01-03 NOTE — Assessment & Plan Note (Signed)
Right knee continues to do well after a Monovisc injection in December 2018. At the last visit I injected events left knee, added Celebrex, her knee is pain-free now. Return as needed.

## 2020-01-03 NOTE — Assessment & Plan Note (Signed)
Laura Johnston returns, she is a pleasant 49 year old female with low back pain radiating down the right leg in an L4 distribution. She does get cramping in her legs when walking and at night consistent with neurogenic claudication. Cramping improved considerably with nighttime magnesium oxide. Because she has at this point failed greater than 6 weeks of conservative measures we are going to proceed with an MRI for epidural planning. Adding a short course of hydrocodone to use in the meantime. We do need to go ahead and get some x-rays today.

## 2020-01-04 ENCOUNTER — Ambulatory Visit (INDEPENDENT_AMBULATORY_CARE_PROVIDER_SITE_OTHER): Payer: BC Managed Care – PPO

## 2020-01-04 DIAGNOSIS — M48062 Spinal stenosis, lumbar region with neurogenic claudication: Secondary | ICD-10-CM | POA: Diagnosis not present

## 2020-01-04 DIAGNOSIS — M545 Low back pain: Secondary | ICD-10-CM | POA: Diagnosis not present

## 2020-01-04 DIAGNOSIS — M4807 Spinal stenosis, lumbosacral region: Secondary | ICD-10-CM | POA: Diagnosis not present

## 2020-01-28 ENCOUNTER — Encounter: Payer: Self-pay | Admitting: Family Medicine

## 2020-01-28 NOTE — Telephone Encounter (Signed)
Patient said this is ridiculous to get a "yes" or "no" answer if she is a high risk and she doesn't want to pay the co-pay. She states she will find the answer somewhere else. No further questions a this time.

## 2020-01-28 NOTE — Telephone Encounter (Signed)
OK, I am confused.  It sounded like she was actually requesting to speak with me and we reach out to her to schedule a appointment which could be in person or virtual whichever she preferred.  So I am not quite sure what she is wanting.

## 2020-02-06 ENCOUNTER — Encounter: Payer: Self-pay | Admitting: Family Medicine

## 2020-04-28 ENCOUNTER — Encounter: Payer: Self-pay | Admitting: Medical-Surgical

## 2020-04-28 ENCOUNTER — Telehealth (INDEPENDENT_AMBULATORY_CARE_PROVIDER_SITE_OTHER): Payer: BC Managed Care – PPO | Admitting: Medical-Surgical

## 2020-04-28 VITALS — BP 126/88 | HR 103 | Temp 98.8°F

## 2020-04-28 DIAGNOSIS — B349 Viral infection, unspecified: Secondary | ICD-10-CM

## 2020-04-28 MED ORDER — BENZONATATE 100 MG PO CAPS: 100.0000 mg | ORAL_CAPSULE | Freq: Three times a day (TID) | ORAL | 0 refills | Status: AC | PRN

## 2020-04-28 MED ORDER — GUAIFENESIN-CODEINE 100-10 MG/5ML PO SYRP: 5.0000 mL | ORAL_SOLUTION | Freq: Three times a day (TID) | ORAL | 0 refills | Status: DC | PRN

## 2020-04-28 MED ORDER — BENZONATATE 100 MG PO CAPS: 100.0000 mg | ORAL_CAPSULE | Freq: Three times a day (TID) | ORAL | 0 refills | Status: DC | PRN

## 2020-04-28 MED ORDER — IPRATROPIUM BROMIDE 0.03 % NA SOLN: 2.0000 | Freq: Two times a day (BID) | NASAL | 0 refills | Status: DC

## 2020-04-28 NOTE — Progress Notes (Signed)
Virtual Visit via Video Note  I connected with Laura Johnston on 04/28/20 at 10:10 AM EDT by a video enabled telemedicine application and verified that I am speaking with the correct person using two identifiers.   I discussed the limitations of evaluation and management by telemedicine and the availability of in person appointments. The patient expressed understanding and agreed to proceed.  Patient location: home Provider locations: office  Subjective:    CC: upper respiratory symptoms  HPI: Pleasant 49 year old female presenting via MyChart video visit for upper respiratory symptoms starting 3-4 days ago. Reports experiencing HA, sinus congestion, facial pain/pressure, bilateral ear fullness, rhinorrhea, cough, PND, and sore throat. Cough mostly nonproductive and dry but has seen a small amount of green mucus. Had some intermittent chills yesterday. Denies fever, nausea/vomiting, and diarrhea.Recent family exposure to a young child that was coughing, followed by her son and her husband getting sick. Has tried OTC Mucinex Cold & Flu with minimal relief of symptoms.  Past medical history, Surgical history, Family history not pertinant except as noted below, Social history, Allergies, and medications have been entered into the medical record, reviewed, and corrections made.   Review of Systems: See HPI for pertinent positives and negatives.   Objective:    General: Speaking clearly in complete sentences without any shortness of breath.  Alert and oriented x3.  Normal judgment. No apparent acute distress.  Impression and Recommendations:    1. Acute viral syndrome Recommend supportive treatment. URI OTC recommendations sent to patient via [place]. Atrovent nasal spray BID as needed for rhinorrhea. Tessalon perls TID prn cough. Also sending in  Guaifenesin-codeine cough syrup TID prn cough. Advised that cough syrup with cause drowsiness and is best to take at night. Work note for out  of work today and work from home the rest of the week sent via [place] at patient request.  - ipratropium (ATROVENT) 0.03 % nasal spray; Place 2 sprays into both nostrils every 12 (twelve) hours.  Dispense: 30 mL; Refill: 0 - benzonatate (TESSALON) 100 MG capsule; Take 1 capsule (100 mg total) by mouth 3 (three) times daily as needed for up to 7 days for cough.  Dispense: 21 capsule; Refill: 0 - guaiFENesin-codeine (ROBITUSSIN AC) 100-10 MG/5ML syrup; Take 5 mLs by mouth 3 (three) times daily as needed for cough.  Dispense: 75 mL; Refill: 0  Return if symptoms worsen or fail to improve.  20 minutes of non-face-to-face time was provided during this encounter.   I discussed the assessment and treatment plan with the patient. The patient was provided an opportunity to ask questions and all were answered. The patient agreed with the plan and demonstrated an understanding of the instructions.   The patient was advised to call back or seek an in-person evaluation if the symptoms worsen or if the condition fails to improve as anticipated.   Clearnce Sorrel, DNP, APRN, FNP-BC [facility]  Medications & Home Remedies for Upper Respiratory Illness   Note: the following list assumes no pregnancy, normal liver & kidney function and no other drug interactions.    Aches/Pains, Fever, Headache OTC Acetaminophen (Tylenol) 500 mg tablets - take max 2 tablets (1000 mg) every 6 hours (4 times per day)  OTC Ibuprofen (Motrin) 200 mg tablets - take max 4 tablets (800 mg) every 6 hours*   Sinus Congestion Prescription Atrovent as directed OTC Nasal Saline if desired to rinse OTC Oxymetolazone (Afrin, others) sparing use due to rebound  congestion, NEVER use in kids OTC Diphenhydramine (Benadryl) 25 mg tablets - take max 2 tablets every 4 hours   Cough & Sore Throat Prescription cough pills or syrups as directed OTC Dextromethorphan (Robitussin,  others) - cough suppressant OTC Guaifenesin (Robitussin, Mucinex, others) - expectorant (helps cough up mucus) (Dextromethorphan and Guaifenesin also come in a combination tablet/syrup) OTC Lozenges w/ Benzocaine + Menthol (Cepacol) Honey - as much as you want! Teas which "coat the throat" - look for ingredients Elm Bark, Licorice Root, Marshmallow Root   Other OTC Zinc Lozenges within 24 hours of symptoms onset - mixed evidence this shortens the duration of the common cold Don't waste your money on Vitamin C or Echinacea in acute illness - it's already too late!    *Caution in patients with high blood pressure

## 2020-05-01 ENCOUNTER — Other Ambulatory Visit: Payer: Self-pay | Admitting: Medical-Surgical

## 2020-05-01 DIAGNOSIS — J0100 Acute maxillary sinusitis, unspecified: Secondary | ICD-10-CM

## 2020-05-01 MED ORDER — AMOXICILLIN-POT CLAVULANATE 875-125 MG PO TABS: 1.0000 | ORAL_TABLET | Freq: Two times a day (BID) | ORAL | 0 refills | Status: AC

## 2020-05-01 MED ORDER — IBUPROFEN 600 MG PO TABS
600.0000 mg | ORAL_TABLET | Freq: Three times a day (TID) | ORAL | 0 refills | Status: DC | PRN
Start: 2020-05-01 — End: 2021-08-31

## 2020-05-01 NOTE — Progress Notes (Deleted)
ibupro

## 2020-05-01 NOTE — Progress Notes (Signed)
Upper respiratory symptoms continuing for 7 days without improvement. Sending in Augmentin BID x 10 days.

## 2020-05-14 ENCOUNTER — Emergency Department
Admission: EM | Admit: 2020-05-14 | Discharge: 2020-05-14 | Disposition: A | Discharge status: Home / Self Care | Payer: BC Managed Care – PPO | Source: Home / Self Care

## 2020-05-14 ENCOUNTER — Other Ambulatory Visit: Payer: Self-pay

## 2020-05-14 DIAGNOSIS — L509 Urticaria, unspecified: Secondary | ICD-10-CM | POA: Diagnosis not present

## 2020-05-14 MED ORDER — CETIRIZINE HCL 10 MG PO TABS: 10.0000 mg | ORAL_TABLET | Freq: Every day | ORAL | 0 refills | Status: DC

## 2020-05-14 MED ORDER — TRIAMCINOLONE ACETONIDE 0.1 % EX CREA
1.0000 | TOPICAL_CREAM | Freq: Two times a day (BID) | CUTANEOUS | 0 refills | Status: DC
Start: 2020-05-14 — End: 2020-08-20

## 2020-05-14 MED ORDER — DEXAMETHASONE SODIUM PHOSPHATE 10 MG/ML IJ SOLN
10.0000 mg | Freq: Once | INTRAMUSCULAR | Status: AC
  Administered 2020-05-14: 10 mg via INTRAMUSCULAR

## 2020-05-14 MED ORDER — FAMOTIDINE 40 MG PO TABS
40.0000 mg | ORAL_TABLET | Freq: Once | ORAL | Status: AC
  Administered 2020-05-14: 40 mg via ORAL

## 2020-05-14 NOTE — ED Provider Notes (Signed)
[name]    CSN: [id number] Arrival date & time: 05/14/20  1626      History   Chief Complaint Chief Complaint  Patient presents with  . Urticaria    HPI Laura Johnston is a 49 y.o. female.   HPI  Laura Johnston is a 49 y.o. female presenting to UC with c/o itchy burning rash that started on her Right hand last night, has since developed hives on her Right elbow, and smaller red itchy bumps on her back, Left hand and Left lower leg. She took benadryl this morning with mild relief.  She reports using hand sanitizer at a bank but otherwise, no new soaps, lotions or medications. She did have 3 episodes of loose stool this morning but none since 12PM. Denies fever, chills, nausea or vomiting. No contact with others with similar rash. No recent travel.   Pt reports possible adverse reaction to prednisone a few years ago. She was prescribed a tapered dose pack. Wound up forgetting her last two days of medication when she traveled to [place] and then to [place]. It was unknown at the time if the bilateral leg swelling was due to missing her last 2 days of prednisone or from her travel.    Past Medical History:  Diagnosis Date  . Abnormal Pap smear   . AMA (advanced maternal age) multigravida 27+   . GERD (gastroesophageal reflux disease)   . Gestational diabetes   . Prediabetes 01/23/2018  . Vaginal delivery 08/19/2011    Patient Active Problem List   Diagnosis Date Noted  . Lumbar spinal stenosis 01/03/2020  . Cramp in lower extremity associated with sleep 12/04/2019  . First degree ankle sprain, left, initial encounter 04/13/2018  . 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 Class 1 obesity due to excess calories with serious comorbidity in adult 01/31/2018  . Prediabetes 01/23/2018  . Eye muscle twitches 01/22/2018  . Recurrent upper respiratory tract infection 01/22/2018  . Heart palpitations 05/17/2017  . Primary osteoarthritis of both knees 03/02/2017  .  Fatty liver disease, nonalcoholic 65/78/4696  . Vitamin D deficiency 07/20/2016  . HYPERTRIGLYCERIDEMIA 03/08/2010  . DIZZINESS 02/11/2010    Past Surgical History:  Procedure Laterality Date  . APPENDECTOMY    . KNEE SURGERY    . LEEP    . TUMOR REMOVAL     from buttocks    OB History    Gravida  2   Para  1   Term  1   Preterm  0   AB  1   Living  1     SAB  1   TAB  0   Ectopic  0   Multiple  0   Live Births  1            Home Medications    Prior to Admission medications   Medication Sig Start Date End Date Taking? Authorizing Provider  blood glucose meter kit and supplies Dispense based on patient and insurance preference. Use once daily as directed. (FOR ICD- R73.03). 11/07/18   Hali Marry, MD  butalbital-acetaminophen-caffeine (FIORICET) [phone] MG tablet Take 1-2 tablets by mouth every 6 (six) hours as needed for headache. Patient not taking: Reported on 04/28/2020 05/17/19 05/16/20  Emeterio Reeve, DO  celecoxib (CELEBREX) 200 MG capsule One to 2 tablets by mouth daily as needed for pain. Patient not taking: Reported on 04/28/2020 11/06/19   Silverio Decamp, MD  cetirizine (ZYRTEC) 10 MG tablet Take 1 tablet (10 mg  total) by mouth daily. 05/14/20   Noe Gens, PA-C  cyclobenzaprine (FLEXERIL) 10 MG tablet Take 1 tablet (10 mg total) by mouth 3 (three) times daily as needed for muscle spasms. Patient not taking: Reported on 04/28/2020 06/25/19   Hali Marry, MD  glucose blood test strip For testing blood sugars once a day. DX:R73.03 Patient not taking: Reported on 04/28/2020 12/06/18   Hali Marry, MD  guaiFENesin-codeine [facility]) 100-10 MG/5ML syrup Take 5 mLs by mouth 3 (three) times daily as needed for cough. 04/28/20   Samuel Bouche, NP  HYDROcodone-acetaminophen (NORCO/VICODIN) 5-325 MG tablet Take 1 tablet by mouth every 8 (eight) hours as needed for moderate pain. Patient not taking: Reported on 04/28/2020  01/03/20   Silverio Decamp, MD  ibuprofen (ADVIL) 600 MG tablet Take 1 tablet (600 mg total) by mouth every 8 (eight) hours as needed. 05/01/20   Samuel Bouche, NP  ipratropium (ATROVENT) 0.03 % nasal spray Place 2 sprays into both nostrils every 12 (twelve) hours. 04/28/20   Samuel Bouche, NP  Lancets [facility] DELICA PLUS ENIDPO24M) MISC USE AS DIRECTED 05/22/19   Hali Marry, MD  magnesium oxide (MAG-OX) 400 MG tablet Take 2 tablets (800 mg total) by mouth at bedtime. 12/04/19   Silverio Decamp, MD  metoprolol succinate (TOPROL-XL) 25 MG 24 hr tablet Take 1 tablet (25 mg total) by mouth daily. Patient not taking: Reported on 04/28/2020 05/17/19   Emeterio Reeve, DO  triamcinolone cream (KENALOG) 0.1 % Apply 1 application topically 2 (two) times daily. 05/14/20   Noe Gens, PA-C    Family History Family History  Problem Relation Age of Onset  . Stroke Father   . Hypertension Father   . Hyperlipidemia Father   . Heart disease Father   . Diabetes Maternal Uncle   . Stroke Paternal Grandmother     Social History Social History   Tobacco Use  . Smoking status: Former Smoker    Years: 2.00    Types: Cigarettes    Quit date: 01/23/2016    Years since quitting: 4.3  . Smokeless tobacco: Never Used  . Tobacco comment: 1-3 cig/day  Vaping Use  . Vaping Use: Never used  Substance Use Topics  . Alcohol use: No  . Drug use: No     Allergies   Cortisone, Hydrocortisone, Meloxicam, Meloxicam, Prednisone, and Pseudoephedrine   Review of Systems Review of Systems  Constitutional: Negative for chills and fever.  HENT: Negative for facial swelling, trouble swallowing and voice change.   Respiratory: Negative for shortness of breath, wheezing and stridor.   Gastrointestinal: Positive for diarrhea. Negative for nausea and vomiting.  Skin: Positive for rash. Negative for wound.     Physical Exam Triage Vital Signs ED Triage Vitals  Enc Vitals Group     BP  05/14/20 1634 126/85     Pulse Rate 05/14/20 1634 98     Resp 05/14/20 1634 18     Temp 05/14/20 1634 98.1 F (36.7 C)     Temp Source 05/14/20 1634 Oral     SpO2 05/14/20 1634 98 %     Weight --      Height --      Head Circumference --      Peak Flow --      Pain Score 05/14/20 1637 0     Pain Loc --      Pain Edu? --      Excl. in [place]? --  No data found.  Updated Vital Signs BP 126/85 (BP Location: Left Arm)   Pulse 98   Temp 98.1 F (36.7 C) (Oral)   Resp 18   LMP 10/25/2011   SpO2 98%   Visual Acuity Right Eye Distance:   Left Eye Distance:   Bilateral Distance:    Right Eye Near:   Left Eye Near:    Bilateral Near:     Physical Exam Vitals and nursing note reviewed.  Constitutional:      Appearance: Normal appearance. She is well-developed.  HENT:     Head: Normocephalic and atraumatic.  Cardiovascular:     Rate and Rhythm: Normal rate and regular rhythm.  Pulmonary:     Effort: Pulmonary effort is normal. No respiratory distress.     Breath sounds: Normal breath sounds. No stridor. No wheezing, rhonchi or rales.  Musculoskeletal:        General: Normal range of motion.     Cervical back: Normal range of motion.  Skin:    General: Skin is warm and dry.     Findings: Erythema and rash ( diffuse erythematous papules on hands, back, and left lower leg) present. Rash is papular and urticarial (Right elbow, smaller lesions on Left arm).  Neurological:     Mental Status: She is alert and oriented to person, place, and time.  Psychiatric:        Behavior: Behavior normal.      UC Treatments / Results  Labs (all labs ordered are listed, but only abnormal results are displayed) Labs Reviewed - No data to display  EKG   Radiology No results found.  Procedures Procedures (including critical care time)  Medications Ordered in UC Medications  dexamethasone (DECADRON) injection 10 mg (10 mg Intramuscular Given 05/14/20 1705)  famotidine (PEPCID)  tablet 40 mg (40 mg Oral Given 05/14/20 1705)    Initial Impression / Assessment and Plan / UC Course  I have reviewed the triage vital signs and the nursing notes.  Pertinent labs & imaging results that were available during my care of the patient were reviewed by me and considered in my medical decision making (see chart for details).    Redness improved significantly after given decadron and pepcid in UC Encouraged f/u with PCP as needed Discussed symptoms that warrant emergent care in the ED. AVS provided  Final Clinical Impressions(s) / UC Diagnoses   Final diagnoses:  Urticaria     Discharge Instructions      You may take the cetirizine when you pick it up this evening to help with itching and inflammation.  You may take this medication the same time every day to help until your symptoms resolve.  Follow up with your primary care provider if not improving in 2-3 days, sooner if worsening.  Call 911 or go to the hospital if you develop swelling of your mouth, tongue, or throat, trouble breathing or swallowing, or other new concerning symptoms develop.     ED Prescriptions    Medication Sig Dispense Auth. Provider   cetirizine (ZYRTEC) 10 MG tablet Take 1 tablet (10 mg total) by mouth daily. 30 tablet Leeroy Cha O, PA-C   triamcinolone cream (KENALOG) 0.1 % Apply 1 application topically 2 (two) times daily. 30 g Noe Gens, [place]     PDMP not reviewed this encounter.   Noe Gens, [place] 05/14/20 1804

## 2020-05-14 NOTE — ED Triage Notes (Signed)
Pt c/o itchy, burning rash that started on RT hand. Now has spots all over arms, back of head and legs. Benedryl this am at 940am.

## 2020-05-14 NOTE — Discharge Instructions (Signed)
  You may take the cetirizine when you pick it up this evening to help with itching and inflammation.  You may take this medication the same time every day to help until your symptoms resolve.  Follow up with your primary care provider if not improving in 2-3 days, sooner if worsening.  Call 911 or go to the hospital if you develop swelling of your mouth, tongue, or throat, trouble breathing or swallowing, or other new concerning symptoms develop.

## 2020-07-02 DIAGNOSIS — E559 Vitamin D deficiency, unspecified: Secondary | ICD-10-CM | POA: Diagnosis not present

## 2020-07-02 DIAGNOSIS — Z6831 Body mass index (BMI) 31.0-31.9, adult: Secondary | ICD-10-CM | POA: Diagnosis not present

## 2020-07-02 DIAGNOSIS — Z13820 Encounter for screening for osteoporosis: Secondary | ICD-10-CM | POA: Diagnosis not present

## 2020-07-02 DIAGNOSIS — N952 Postmenopausal atrophic vaginitis: Secondary | ICD-10-CM | POA: Diagnosis not present

## 2020-07-02 DIAGNOSIS — Z01419 Encounter for gynecological examination (general) (routine) without abnormal findings: Secondary | ICD-10-CM | POA: Diagnosis not present

## 2020-07-02 DIAGNOSIS — Z1231 Encounter for screening mammogram for malignant neoplasm of breast: Secondary | ICD-10-CM | POA: Diagnosis not present

## 2020-07-06 ENCOUNTER — Other Ambulatory Visit: Payer: Self-pay | Admitting: Obstetrics and Gynecology

## 2020-07-06 ENCOUNTER — Encounter: Payer: Self-pay | Admitting: Gastroenterology

## 2020-07-06 DIAGNOSIS — R928 Other abnormal and inconclusive findings on diagnostic imaging of breast: Secondary | ICD-10-CM

## 2020-07-20 ENCOUNTER — Ambulatory Visit
Admission: RE | Admit: 2020-07-20 | Discharge: 2020-07-20 | Disposition: A | Discharge status: Home / Self Care | Payer: BC Managed Care – PPO | Source: Ambulatory Visit | Attending: Obstetrics and Gynecology | Admitting: Obstetrics and Gynecology

## 2020-07-20 ENCOUNTER — Other Ambulatory Visit: Payer: Self-pay

## 2020-07-20 DIAGNOSIS — R928 Other abnormal and inconclusive findings on diagnostic imaging of breast: Secondary | ICD-10-CM

## 2020-07-20 LAB — HM DEXA SCAN

## 2020-07-20 LAB — HM MAMMOGRAPHY

## 2020-07-30 DIAGNOSIS — M8580 Other specified disorders of bone density and structure, unspecified site: Secondary | ICD-10-CM | POA: Diagnosis not present

## 2020-08-20 ENCOUNTER — Ambulatory Visit (AMBULATORY_SURGERY_CENTER): Payer: Self-pay | Admitting: *Deleted

## 2020-08-20 ENCOUNTER — Other Ambulatory Visit: Payer: Self-pay | Admitting: Gastroenterology

## 2020-08-20 ENCOUNTER — Other Ambulatory Visit: Payer: Self-pay

## 2020-08-20 VITALS — Ht 64.0 in | Wt 185.0 lb

## 2020-08-20 DIAGNOSIS — Z01818 Encounter for other preprocedural examination: Secondary | ICD-10-CM

## 2020-08-20 DIAGNOSIS — Z1211 Encounter for screening for malignant neoplasm of colon: Secondary | ICD-10-CM

## 2020-08-20 MED ORDER — SUTAB 1479-225-188 MG PO TABS: 24.0000 | ORAL_TABLET | ORAL | 0 refills | Status: DC

## 2020-08-20 NOTE — Progress Notes (Signed)
10-5  cov test 3 pm   No egg or soy allergy known to patient  No issues with past sedation with any surgeries or procedures no intubation problems in the past  No FH of Malignant Hyperthermia No diet pills per patient No home 02 use per patient  No blood thinners per patient  Pt denies issues with constipation  No A fib or A flutter  EMMI video to pt or via [place] 19 guidelines implemented in PV today with Pt and RN   [employer] given to pt in PV today , Code to Pharmacy   Due to the COVID-19 pandemic we are asking patients to follow these guidelines. Please only bring one care partner. Please be aware that your care partner may wait in the car in the parking lot or if they feel like they will be too hot to wait in the car, they may wait in the lobby on the 4th floor. All care partners are required to wear a mask the entire time (we do not have any that we can provide them), they need to practice social distancing, and we will do a Covid check for all patient's and care partners when you arrive. Also we will check their temperature and your temperature. If the care partner waits in their car they need to stay in the parking lot the entire time and we will call them on their cell phone when the patient is ready for discharge so they can bring the car to the front of the building. Also all patient's will need to wear a mask into building.

## 2020-08-21 ENCOUNTER — Encounter: Payer: Self-pay | Admitting: Gastroenterology

## 2020-08-31 ENCOUNTER — Telehealth: Payer: Self-pay | Admitting: Gastroenterology

## 2020-08-31 NOTE — Telephone Encounter (Signed)
Pt states she was to call back regarding her covid test prior to procedure. States she was to call back to previsit. Lelan Pons looks like you may have worked with her.

## 2020-08-31 NOTE — Telephone Encounter (Signed)
Pt was told to call back from a nurse to confirm a covid test she is supposed to have tomorrow.

## 2020-09-01 ENCOUNTER — Other Ambulatory Visit: Payer: Self-pay | Admitting: Gastroenterology

## 2020-09-01 DIAGNOSIS — Z1159 Encounter for screening for other viral diseases: Secondary | ICD-10-CM | POA: Diagnosis not present

## 2020-09-01 LAB — SARS CORONAVIRUS 2 (TAT 6-24 HRS): SARS Coronavirus 2: NEGATIVE

## 2020-09-02 NOTE — Telephone Encounter (Signed)
Pt called to verify covid test as she states she thought [place] would call her- she had covid test yesterday, its negative, continue as instructed for prep instructions   Laura Johnston pV

## 2020-09-04 ENCOUNTER — Encounter: Payer: Self-pay | Admitting: Gastroenterology

## 2020-09-04 ENCOUNTER — Other Ambulatory Visit: Payer: Self-pay

## 2020-09-04 ENCOUNTER — Encounter: Payer: BC Managed Care – PPO | Admitting: Gastroenterology

## 2020-09-04 ENCOUNTER — Ambulatory Visit (AMBULATORY_SURGERY_CENTER): Payer: BC Managed Care – PPO | Admitting: Gastroenterology

## 2020-09-04 VITALS — BP 114/68 | HR 67 | Temp 97.0°F | Resp 14 | Ht 64.0 in | Wt 185.0 lb

## 2020-09-04 DIAGNOSIS — Z1211 Encounter for screening for malignant neoplasm of colon: Secondary | ICD-10-CM

## 2020-09-04 DIAGNOSIS — D122 Benign neoplasm of ascending colon: Secondary | ICD-10-CM | POA: Diagnosis not present

## 2020-09-04 HISTORY — PX: COLONOSCOPY: SHX174

## 2020-09-04 MED ORDER — SODIUM CHLORIDE 0.9 % IV SOLN: 500.0000 mL | Freq: Once | INTRAVENOUS | Status: DC

## 2020-09-04 NOTE — Progress Notes (Signed)
Per Samantha Crimes, RN, pt wanted her to ask Dr. Tarri Glenn why she has a sometimes has a difficult time pushing out stool from her rectum.  Pt said she has BM either once or twice a day. But sometimes the stool feels like it get stuck in her rectum and she has to push with her finger around her anus to expel the stool.  Samantha Crimes, RN spoke with Dr. Tarri Glenn over the phone, and Dr. Tarri Glenn said she did not see any abnormalities in pt's rectum.  MD recommended pt call to set and set up an appointment to see Dr. Tarri Glenn in the office.  Pt was advised of what Dr. Tarri Glenn recommended by Samantha Crimes, RN.  I also went over that pt needs to call to schedule an appointment with Dr. Tarri Glenn and this was also on pt's AVS.  No other issues noted in the recovery room. Maw

## 2020-09-04 NOTE — Progress Notes (Signed)
A/ox3, pleased with MAC, report to RN 

## 2020-09-04 NOTE — Progress Notes (Signed)
Called to room to assist during endoscopic procedure.  Patient ID and intended procedure confirmed with present staff. Received instructions for my participation in the procedure from the performing physician.  

## 2020-09-04 NOTE — Patient Instructions (Addendum)
HANDOUTS PROVIDED ON: POLYPS  The polyp removed today have been sent for pathology.  The results can take 1-3 weeks to receive.  When your next colonoscopy should occur will be based on the pathology results.    You may resume your previous diet and medication schedule.  Please call the office to schedule and appointment with Dr. Tarri Glenn to discuss having issues with stool in your rectum. [phone]  Thank you for allowing [place] to care for you today!!!   YOU HAD AN ENDOSCOPIC PROCEDURE TODAY AT [facility]:   Refer to the procedure report that was given to you for any specific questions about what was found during the examination.  If the procedure report does not answer your questions, please call your gastroenterologist to clarify.  If you requested that your care partner not be given the details of your procedure findings, then the procedure report has been included in a sealed envelope for you to review at your convenience later.  YOU SHOULD EXPECT: Some feelings of bloating in the abdomen. Passage of more gas than usual.  Walking can help get rid of the air that was put into your GI tract during the procedure and reduce the bloating. If you had a lower endoscopy (such as a colonoscopy or flexible sigmoidoscopy) you may notice spotting of blood in your stool or on the toilet paper. If you underwent a bowel prep for your procedure, you may not have a normal bowel movement for a few days.  Please Note:  You might notice some irritation and congestion in your nose or some drainage.  This is from the oxygen used during your procedure.  There is no need for concern and it should clear up in a day or so.  SYMPTOMS TO REPORT IMMEDIATELY:   Following lower endoscopy (colonoscopy or flexible sigmoidoscopy):  Excessive amounts of blood in the stool  Significant tenderness or worsening of abdominal pains  Swelling of the abdomen that is new, acute  Fever of 100F or higher  For  urgent or emergent issues, a gastroenterologist can be reached at any hour by calling [phone]. Do not use MyChart messaging for urgent concerns.    DIET:  We do recommend a small meal at first, but then you may proceed to your regular diet.  Drink plenty of fluids but you should avoid alcoholic beverages for 24 hours.  ACTIVITY:  You should plan to take it easy for the rest of today and you should NOT DRIVE or use heavy machinery until tomorrow (because of the sedation medicines used during the test).    FOLLOW UP: Our staff will call the number listed on your records 48-72 hours following your procedure to check on you and address any questions or concerns that you may have regarding the information given to you following your procedure. If we do not reach you, we will leave a message.  We will attempt to reach you two times.  During this call, we will ask if you have developed any symptoms of COVID 19. If you develop any symptoms (ie: fever, flu-like symptoms, shortness of breath, cough etc.) before then, please call [phone].  If you test positive for Covid 19 in the 2 weeks post procedure, please call and report this information to [place].    If any biopsies were taken you will be contacted by phone or by letter within the next 1-3 weeks.  Please call us at [phone] if you have not heard about  the biopsies in 3 weeks.    SIGNATURES/CONFIDENTIALITY: You and/or your care partner have signed paperwork which will be entered into your electronic medical record.  These signatures attest to the fact that that the information above on your After Visit Summary has been reviewed and is understood.  Full responsibility of the confidentiality of this discharge information lies with you and/or your care-partner.

## 2020-09-04 NOTE — Progress Notes (Signed)
Pt's states no medical or surgical changes since previsit or office visit.  ° °Vitals CW °

## 2020-09-04 NOTE — Op Note (Addendum)
[place] Patient Name: Zamari Bonsall Procedure Date: 09/04/2020 11:26 AM MRN: [id number] Endoscopist: Thornton Park MD, MD Age: 49 Referring MD:  Date of Birth: 03-30-1971 Gender: Female Account #: [id number] Procedure:                Colonoscopy Indications:              Screening for colorectal malignant neoplasm, This                            is the patient's first colonoscopy                           No known family history of colon cancer or polyps Medicines:                Monitored Anesthesia Care Procedure:                Pre-Anesthesia Assessment:                           - Prior to the procedure, a History and Physical                            was performed, and patient medications and                            allergies were reviewed. The patient's tolerance of                            previous anesthesia was also reviewed. The risks                            and benefits of the procedure and the sedation                            options and risks were discussed with the patient.                            All questions were answered, and informed consent                            was obtained. Prior Anticoagulants: The patient has                            taken no previous anticoagulant or antiplatelet                            agents. ASA Grade Assessment: II - A patient with                            mild systemic disease. After reviewing the risks                            and benefits, the patient was deemed in  satisfactory condition to undergo the procedure.                           After obtaining informed consent, the colonoscope                            was passed under direct vision. Throughout the                            procedure, the patient's blood pressure, pulse, and                            oxygen saturations were monitored continuously. The                            Colonoscope  was introduced through the anus and                            advanced to the 3 cm into the ileum. The                            colonoscopy was performed without difficulty. The                            patient tolerated the procedure well. The quality                            of the bowel preparation was good. The terminal                            ileum, ileocecal valve, appendiceal orifice, and                            rectum were photographed. Scope In: 11:37:52 AM Scope Out: 11:48:27 AM Scope Withdrawal Time: 0 hours 8 minutes 34 seconds  Total Procedure Duration: 0 hours 10 minutes 35 seconds  Findings:                 The perianal and digital rectal examinations were                            normal except for hemorrhoids.                           A 2 mm polyp was found in the ascending colon. The                            polyp was sessile. The polyp was removed with a                            cold snare. Resection and retrieval were complete.                            Estimated blood loss was minimal.  The exam was otherwise without abnormality on                            direct and retroflexion views. Complications:            No immediate complications. Estimated blood loss:                            Minimal. Estimated Blood Loss:     Estimated blood loss was minimal. Impression:               - One 2 mm polyp in the ascending colon, removed                            with a cold snare. Resected and retrieved.                           - The examination was otherwise normal on direct                            and retroflexion views. Recommendation:           - Patient has a contact number available for                            emergencies. The signs and symptoms of potential                            delayed complications were discussed with the                            patient. Return to normal activities tomorrow.                             Written discharge instructions were provided to the                            patient.                           - Resume previous diet.                           - Continue present medications.                           - Await pathology results.                           - Repeat colonoscopy date to be determined after                            pending pathology results are reviewed for                            surveillance.                           -  Emerging evidence supports eating a diet of                            fruits, vegetables, grains, calcium, and yogurt                            while reducing red meat and alcohol may reduce the                            risk of colon cancer.                           - Thank you for allowing me to be involved in your                            colon cancer prevention. Thornton Park MD, MD 09/04/2020 11:52:05 AM This report has been signed electronically.

## 2020-09-08 ENCOUNTER — Telehealth: Payer: Self-pay

## 2020-09-08 NOTE — Telephone Encounter (Signed)
°  Follow up Call-  Call back number 09/04/2020  Post procedure Call Back phone  # [phone]  Permission to leave phone message Yes  Some recent data might be hidden     Patient questions:  Do you have a fever, pain , or abdominal swelling? No. Pain Score  0 *  Have you tolerated food without any problems? Yes.    Have you been able to return to your normal activities? Yes.    Do you have any questions about your discharge instructions: Diet   No. Medications  No. Follow up visit  No.  Do you have questions or concerns about your Care? No.  Actions: * If pain score is 4 or above: No action needed, pain <4.  1. Have you developed a fever since your procedure? no  2.   Have you had an respiratory symptoms (SOB or cough) since your procedure? no  3.   Have you tested positive for COVID 19 since your procedure no  4.   Have you had any family members/close contacts diagnosed with the COVID 19 since your procedure?  no   If yes to any of these questions please route to Joylene John, RN and Joella Prince, RN

## 2020-09-09 ENCOUNTER — Encounter: Payer: Self-pay | Admitting: Gastroenterology

## 2020-09-14 DIAGNOSIS — H5203 Hypermetropia, bilateral: Secondary | ICD-10-CM | POA: Diagnosis not present

## 2020-10-28 ENCOUNTER — Ambulatory Visit (INDEPENDENT_AMBULATORY_CARE_PROVIDER_SITE_OTHER): Payer: BC Managed Care – PPO

## 2020-10-28 ENCOUNTER — Ambulatory Visit (INDEPENDENT_AMBULATORY_CARE_PROVIDER_SITE_OTHER): Payer: BC Managed Care – PPO | Admitting: Sports Medicine

## 2020-10-28 ENCOUNTER — Other Ambulatory Visit: Payer: Self-pay

## 2020-10-28 DIAGNOSIS — G9520 Unspecified cord compression: Secondary | ICD-10-CM

## 2020-10-28 DIAGNOSIS — M5134 Other intervertebral disc degeneration, thoracic region: Secondary | ICD-10-CM | POA: Diagnosis not present

## 2020-10-28 DIAGNOSIS — G959 Disease of spinal cord, unspecified: Secondary | ICD-10-CM | POA: Diagnosis not present

## 2020-10-28 DIAGNOSIS — M545 Low back pain, unspecified: Secondary | ICD-10-CM | POA: Diagnosis not present

## 2020-10-28 MED ORDER — PREDNISONE 50 MG PO TABS: ORAL_TABLET | ORAL | 0 refills | Status: DC

## 2020-10-28 MED ORDER — GABAPENTIN 300 MG PO CAPS: ORAL_CAPSULE | ORAL | 3 refills | Status: DC

## 2020-10-28 NOTE — Assessment & Plan Note (Signed)
This is a pleasant 49 year old female, she has chronic low back pain, with numbness and tingling down both legs, we had been treating her conservatively. 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 At the point of initial visit she did not have any progressive weakness, or incontinence. Unfortunately she continues to have numbness and tingling going down her legs with weakness upon flexion of her cervical spine. Due to these findings I am highly concerned that this is a significant spinal cord compressive lesion and she likely needs decompression. I am going to start gabapentin for symptom relief however I would like a new MRI as well as a consultation from orthopedic spine surgery or neurosurgery.

## 2020-10-28 NOTE — Progress Notes (Signed)
    Procedures performed today:    None.  Independent interpretation of notes and tests performed by another provider:   None.  Brief History, Exam, Impression, and Recommendations:    Spinal cord compression, thoracic This is a pleasant 49 year old female, she has chronic low back pain, with numbness and tingling down both legs, we had been treating her conservatively. 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 At the point of initial visit she did not have any progressive weakness, or incontinence. Unfortunately she continues to have numbness and tingling going down her legs with weakness upon flexion of her cervical spine. Due to these findings I am highly concerned that this is a significant spinal cord compressive lesion and she likely needs decompression. I am going to start gabapentin for symptom relief however I would like a new MRI as well as a consultation from orthopedic spine surgery or neurosurgery.    ___________________________________________ Gwen Her. Dianah Field, M.D., ABFM., CAQSM. Primary Care and [place] Instructor of [employer] [facility]

## 2020-10-30 ENCOUNTER — Ambulatory Visit: Payer: BC Managed Care – PPO | Admitting: Family Medicine

## 2020-11-01 ENCOUNTER — Other Ambulatory Visit: Payer: Self-pay

## 2020-11-01 ENCOUNTER — Ambulatory Visit (INDEPENDENT_AMBULATORY_CARE_PROVIDER_SITE_OTHER): Payer: BC Managed Care – PPO

## 2020-11-01 DIAGNOSIS — M4804 Spinal stenosis, thoracic region: Secondary | ICD-10-CM | POA: Diagnosis not present

## 2020-11-01 DIAGNOSIS — M48061 Spinal stenosis, lumbar region without neurogenic claudication: Secondary | ICD-10-CM | POA: Diagnosis not present

## 2020-11-01 DIAGNOSIS — R200 Anesthesia of skin: Secondary | ICD-10-CM | POA: Diagnosis not present

## 2020-11-01 DIAGNOSIS — M5126 Other intervertebral disc displacement, lumbar region: Secondary | ICD-10-CM | POA: Diagnosis not present

## 2020-11-01 DIAGNOSIS — R202 Paresthesia of skin: Secondary | ICD-10-CM | POA: Diagnosis not present

## 2020-11-03 DIAGNOSIS — M5104 Intervertebral disc disorders with myelopathy, thoracic region: Secondary | ICD-10-CM | POA: Diagnosis not present

## 2020-11-24 ENCOUNTER — Other Ambulatory Visit: Payer: Self-pay

## 2020-11-24 ENCOUNTER — Emergency Department (INDEPENDENT_AMBULATORY_CARE_PROVIDER_SITE_OTHER)
Admission: EM | Admit: 2020-11-24 | Discharge: 2020-11-24 | Disposition: A | Discharge status: Home / Self Care | Payer: BC Managed Care – PPO | Source: Home / Self Care

## 2020-11-24 DIAGNOSIS — J069 Acute upper respiratory infection, unspecified: Secondary | ICD-10-CM

## 2020-11-24 DIAGNOSIS — Z20822 Contact with and (suspected) exposure to covid-19: Secondary | ICD-10-CM | POA: Diagnosis not present

## 2020-11-24 DIAGNOSIS — U071 COVID-19: Secondary | ICD-10-CM | POA: Diagnosis not present

## 2020-11-24 LAB — POC SARS CORONAVIRUS 2 AG -  ED: SARS Coronavirus 2 Ag: POSITIVE — AB

## 2020-11-24 MED ORDER — HYDROCOD POLST-CPM POLST ER 10-8 MG/5ML PO SUER: 5.0000 mL | Freq: Two times a day (BID) | ORAL | 0 refills | Status: DC | PRN

## 2020-11-24 MED ORDER — ALBUTEROL SULFATE HFA 108 (90 BASE) MCG/ACT IN AERS: 1.0000 | INHALATION_SPRAY | Freq: Four times a day (QID) | RESPIRATORY_TRACT | 2 refills | Status: DC | PRN

## 2020-11-24 MED ORDER — DEXAMETHASONE 4 MG PO TABS
4.0000 mg | ORAL_TABLET | Freq: Two times a day (BID) | ORAL | 0 refills | Status: DC
Start: 2020-11-24 — End: 2021-08-31

## 2020-11-24 MED ORDER — ALBUTEROL SULFATE (2.5 MG/3ML) 0.083% IN NEBU
2.5000 mg | INHALATION_SOLUTION | Freq: Four times a day (QID) | RESPIRATORY_TRACT | 12 refills | Status: DC | PRN
Start: 2020-11-24 — End: 2021-08-31

## 2020-11-24 NOTE — ED Triage Notes (Signed)
Patient presents to Urgent Care with complaints of cough and fever since 3 days ago. Patient reports she was exposed to covid 2 days before her sx started.  Pt has not been vaccinated for covid.

## 2020-11-24 NOTE — ED Provider Notes (Signed)
[facility]    CSN: [id number] Arrival date & time: 11/24/20  1714      History   Chief Complaint Chief Complaint  Patient presents with  . Cough  . Fever    HPI Laura Johnston is a 49 y.o. female.   This is an established [place] urgent care patient, 49 years old, who comes in with symptoms of Covid along with 2 other family members.  Patient presents to Urgent Care with complaints of cough and fever since 3 days ago. Patient reports she was exposed to covid 2 days before her sx started.  Pt has not been vaccinated for covid.      Past Medical History:  Diagnosis Date  . Abnormal Pap smear   . Allergy    mild   . AMA (advanced maternal age) multigravida 35+   . Arthritis    knees, back   . GERD (gastroesophageal reflux disease)   . Gestational diabetes    no meds - had gestational diabetes ONLY   . Osteopenia   . Prediabetes 01/23/2018  . Vaginal delivery 08/19/2011    Patient Active Problem List   Diagnosis Date Noted  . Spinal cord compression, thoracic 01/03/2020  . Cramp in lower extremity associated with sleep 12/04/2019  . First degree ankle sprain, left, initial encounter 04/13/2018  . 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 Class 1 obesity due to excess calories with serious comorbidity in adult 01/31/2018  . Prediabetes 01/23/2018  . Eye muscle twitches 01/22/2018  . Recurrent upper respiratory tract infection 01/22/2018  . Heart palpitations 05/17/2017  . Primary osteoarthritis of both knees 03/02/2017  . Fatty liver disease, nonalcoholic 08/31/2016  . Vitamin D deficiency 07/20/2016  . HYPERTRIGLYCERIDEMIA 03/08/2010  . DIZZINESS 02/11/2010    Past Surgical History:  Procedure Laterality Date  . APPENDECTOMY    . COLONOSCOPY  09/04/2020  . KNEE SURGERY    . LEEP    . TUMOR REMOVAL     from buttocks    OB History    Gravida  2   Para  1   Term  1   Preterm  0   AB  1   Living  1     SAB  1   IAB  0    Ectopic  0   Multiple  0   Live Births  1            Home Medications    Prior to Admission medications   Medication Sig Start Date End Date Taking? Authorizing Provider  albuterol (PROVENTIL) (2.5 MG/3ML) 0.083% nebulizer solution Take 3 mLs (2.5 mg total) by nebulization every 6 (six) hours as needed for wheezing or shortness of breath. 11/24/20  Yes Elvina Sidle, MD  albuterol (VENTOLIN HFA) 108 (90 Base) MCG/ACT inhaler Inhale 1-2 puffs into the lungs every 6 (six) hours as needed for wheezing or shortness of breath. 11/24/20  Yes Elvina Sidle, MD  chlorpheniramine-HYDROcodone [facility] PENNKINETIC ER) 10-8 MG/5ML SUER Take 5 mLs by mouth every 12 (twelve) hours as needed for cough. 11/24/20  Yes Elvina Sidle, MD  dexamethasone (DECADRON) 4 MG tablet Take 1 tablet (4 mg total) by mouth 2 (two) times daily with a meal. 11/24/20  Yes Elvina Sidle, MD  gabapentin (NEURONTIN) 300 MG capsule One tab PO qHS for a week, then BID for a week, then TID. May double weekly to a max of 3,600mg /day 10/28/20   Monica Becton, MD  ibuprofen (ADVIL) 600 MG tablet Take  1 tablet (600 mg total) by mouth every 8 (eight) hours as needed. 05/01/20   Samuel Bouche, NP  magnesium 30 MG tablet Take 30 mg by mouth daily.    [provider]  Multiple Vitamins-Minerals (MULTIVITAMIN WOMEN 50+ PO) Take by mouth.    [provider]  Vitamin D, Ergocalciferol, (DRISDOL) 1.25 MG (50000 UNIT) CAPS capsule Take 50,000 Units by mouth once a week. 07/27/20   [provider]    Family History Family History  Problem Relation Age of Onset  . Stroke Father   . Hypertension Father   . Hyperlipidemia Father   . Heart disease Father   . Diabetes Maternal Uncle   . Stroke Paternal Grandmother   . Colon polyps Mother   . Colon cancer Neg Hx   . Esophageal cancer Neg Hx   . Rectal cancer Neg Hx   . Stomach cancer Neg Hx     Social History Social History   Tobacco Use   . Smoking status: Former Smoker    Years: 2.00    Types: Cigarettes    Quit date: 01/23/2016    Years since quitting: 4.8  . Smokeless tobacco: Never Used  . Tobacco comment: 1-3 cig/day  Vaping Use  . Vaping Use: Never used  Substance Use Topics  . Alcohol use: No  . Drug use: No     Allergies   Cortisone, Hydrocortisone, Meloxicam, Meloxicam, and Pseudoephedrine   Review of Systems Review of Systems   Physical Exam Triage Vital Signs ED Triage Vitals  Enc Vitals Group     BP 11/24/20 1736 (!) 139/94     Pulse Rate 11/24/20 1736 (!) 116     Resp 11/24/20 1736 17     Temp 11/24/20 1736 98.3 F (36.8 C)     Temp Source 11/24/20 1736 Oral     SpO2 11/24/20 1736 97 %     Weight --      Height --      Head Circumference --      Peak Flow --      Pain Score 11/24/20 1732 0     Pain Loc --      Pain Edu? --      Excl. in [place]? --    No data found.  Updated Vital Signs BP (!) 139/94 (BP Location: Right Arm)   Pulse (!) 116   Temp 98.3 F (36.8 C) (Oral)   Resp 17   LMP 10/25/2011   SpO2 97%    Physical Exam Vitals and nursing note reviewed.  Constitutional:      Appearance: Normal appearance. She is obese.  HENT:     Head: Normocephalic.     Nose: Nose normal.     Mouth/Throat:     Mouth: Mucous membranes are moist.     Pharynx: Oropharynx is clear.  Eyes:     Conjunctiva/sclera: Conjunctivae normal.  Cardiovascular:     Rate and Rhythm: Tachycardia present.     Heart sounds: Normal heart sounds.  Pulmonary:     Effort: Pulmonary effort is normal.     Breath sounds: Normal breath sounds.  Musculoskeletal:        General: Normal range of motion.     Cervical back: Normal range of motion and neck supple.  Lymphadenopathy:     Cervical: No cervical adenopathy.  Skin:    General: Skin is warm and dry.  Neurological:     General: No focal deficit present.  Mental Status: She is alert and oriented to person, place, and time.  Psychiatric:         Mood and Affect: Mood normal.        Behavior: Behavior normal.        Thought Content: Thought content normal.        Judgment: Judgment normal.      UC Treatments / Results  Labs (all labs ordered are listed, but only abnormal results are displayed) Labs Reviewed  POC SARS CORONAVIRUS 2 AG -  ED - Abnormal; Notable for the following components:      Result Value   SARS Coronavirus 2 Ag Positive (*)    All other components within normal limits    EKG   Radiology No results found.  Procedures Procedures (including critical care time)  Medications Ordered in UC Medications - No data to display  Initial Impression / Assessment and Plan / UC Course  I have reviewed the triage vital signs and the nursing notes.  Pertinent labs & imaging results that were available during my care of the patient were reviewed by me and considered in my medical decision making (see chart for details).    Final Clinical Impressions(s) / UC Diagnoses   Final diagnoses:  Encounter for laboratory testing for COVID-19 virus  Viral URI with cough  COVID-19   Discharge Instructions   None    ED Prescriptions    Medication Sig Dispense Auth. Provider   albuterol (VENTOLIN HFA) 108 (90 Base) MCG/ACT inhaler Inhale 1-2 puffs into the lungs every 6 (six) hours as needed for wheezing or shortness of breath. 18 g Robyn Haber, MD   chlorpheniramine-HYDROcodone [facility] ER) 10-8 MG/5ML SUER Take 5 mLs by mouth every 12 (twelve) hours as needed for cough. 115 mL Robyn Haber, MD   albuterol (PROVENTIL) (2.5 MG/3ML) 0.083% nebulizer solution Take 3 mLs (2.5 mg total) by nebulization every 6 (six) hours as needed for wheezing or shortness of breath. 75 mL Robyn Haber, MD   dexamethasone (DECADRON) 4 MG tablet Take 1 tablet (4 mg total) by mouth 2 (two) times daily with a meal. 10 tablet Robyn Haber, MD     PDMP not reviewed this encounter.   Robyn Haber,  MD 11/24/20 [phone]

## 2020-11-25 ENCOUNTER — Other Ambulatory Visit: Payer: Self-pay | Admitting: Nurse Practitioner

## 2020-11-25 ENCOUNTER — Telehealth (INDEPENDENT_AMBULATORY_CARE_PROVIDER_SITE_OTHER): Payer: BC Managed Care – PPO | Admitting: Family Medicine

## 2020-11-25 ENCOUNTER — Telehealth: Payer: Self-pay | Admitting: Infectious Diseases

## 2020-11-25 ENCOUNTER — Telehealth: Payer: Self-pay

## 2020-11-25 ENCOUNTER — Encounter: Payer: Self-pay | Admitting: Family Medicine

## 2020-11-25 VITALS — Temp 100.1°F

## 2020-11-25 DIAGNOSIS — R0789 Other chest pain: Secondary | ICD-10-CM

## 2020-11-25 DIAGNOSIS — U071 COVID-19: Secondary | ICD-10-CM | POA: Diagnosis not present

## 2020-11-25 NOTE — Telephone Encounter (Signed)
Phone call to pt. In response to BPA for worsening appetite on MyChart questionnaire.  Stated she has a "bad taste in her mouth".  Reported she is drinking grape flavored Pedialyte. Does not like water.  Reported fever of 101.8 at 8:00 AM. Took Advil 200 mg. Cap at 11:00. Temp. 100.5 @ 6:00 PM.  Stated she took a multivitamin this AM, and felt like it "got stuck", and vomited x one.  No further vomiting.  Reported she is burping a lot. Stated she had a video conference call today with her PCP, and told the PCPthat she was having a some blood in her phlegm. Is intermittently coughing up a "foamy white phlegm". Is scheduled for chest xray tomorrow to eval. for pneumonia.   Stated she has eaten small amt. Of vegetable soup. Encouraged to try crackers, soups, toast, applesauce, jello, popsicles.  Questions answered.  Pt. Verb. Understanding.

## 2020-11-25 NOTE — Telephone Encounter (Signed)
Pt tested COVID + on @ [facility] on 11/24/20.  States she is coughing up small amounts of blood. Questioning: does she need a xray for possible pneumonia today.   Has virtual visit tomorrow. Please advise.

## 2020-11-25 NOTE — Progress Notes (Signed)
sxs began 12/25. She had a possible exposure on 11/18/2020  Her sxs have been Sore throat, low grade fever,   She took motrin, robitussin dm,   On yesterday her husband and child had fevers they all got a rapid test that was positive. She was given an inhaler,dexamethasone and tussionex she didn't take any of the medicine yesterday.   She did start the dexamethasone and tussionex today. She said that she is has pressure in her neck,ears and chest. After brushing her teeth she spit out blood.   She has a concern about having PNE due to her sxs. No CXR was done at [facility].  She was asking about an antibody infusion told that her pcp would need to take care of this.

## 2020-11-25 NOTE — Progress Notes (Signed)
Virtual Visit via Video Note  I connected with Laura Johnston on 11/25/20 at  1:30 PM EST by a video enabled telemedicine application and verified that I am speaking with the correct person using two identifiers.   I discussed the limitations of evaluation and management by telemedicine and the availability of in person appointments. The patient expressed understanding and agreed to proceed.  Patient location: Provider location: in office  Subjective:    CC: Cough   HPI:  sxs began 12/25. She had a possible exposure on 11/18/2020  Her sxs have been Sore throat, low grade fever,   She took motrin, robitussin dm,   On yesterday her husband and child had fevers they all got a rapid test that was positive. She was given an inhaler,dexamethasone and tussionex she didn't take any of the medicine yesterday.   She did start the dexamethasone and tussionex today. She said that she is has pressure in her neck,ears and chest. After brushing her teeth she spit out blood.   She has a concern about having PNE due to her sxs. No CXR was done at [facility]. She feels like her chest is heaviness and a tightness in her throat and chest. She was asking about an antibody infusion told that her pcp would need to take care of this. no chest pain. Mostly dry cough. Nose is runny and clear. Throat feels dry.   Fever x 4 days. Fever to 102.7.  Using IBU.  + diaphoresis.  Had some diarrhea yesterday. Lost sense of smell today.  Vomited once after felt her vitamin got stuck. She has been belching a lot.    She doesn't have the COVID vaccine.    Past medical history, Surgical history, Family history not pertinant except as noted below, Social history, Allergies, and medications have been entered into the medical record, reviewed, and corrections made.   Review of Systems: No fevers, chills, night sweats, weight loss, chest pain, or shortness of breath.   Objective:    General: Speaking clearly in  complete sentences without any shortness of breath.  Alert and oriented x3.  Normal judgment. No apparent acute distress.    Impression and Recommendations:    No problem-specific Assessment & Plan notes found for this encounter.  COVID-19-reviewed expectations for symptoms.  She is quarantining.  Her husband and son started develop symptoms in the last day or 2 as well.  She is interested in getting the antibody infusion as she is quite scared about having Covid.  She is not currently vaccinated and does have obesity.  Continue symptomatic care for now.  Did encourage her to get a pulse oximeter if she is able to.  Advil as needed for fever and pain relief.  If she develops new symptoms or worsening symptoms please let us know.  She was unable to get the albuterol nebulizer solution that did not have in stock but did get the albuterol inhaler so reviewed how to properly use an albuterol inhaler and when to use it.  She did start the dexamethasone.  All questions were answered.  Made recommendations for p.o. intake as she has had absolutely no appetite.  Encouraged her to really make sure that she stays hydrated.    Time spent in encounter 32 minutes  I discussed the assessment and treatment plan with the patient. The patient was provided an opportunity to ask questions and all were answered. The patient agreed with the plan and demonstrated an understanding of the instructions.  The patient was advised to call back or seek an in-person evaluation if the symptoms worsen or if the condition fails to improve as anticipated.   Beatrice Lecher, MD

## 2020-11-25 NOTE — Telephone Encounter (Signed)
Called to Discuss with patient about Covid symptoms and the use of the monoclonal antibody infusion for those with mild to moderate Covid symptoms and at a high risk of hospitalization.     Pt appears to qualify for this infusion due to co-morbid conditions and/or a member of an at-risk group in accordance with the FDA Emergency Use Authorization.    Unable to reach pt - LVM and sent MyChart message   Symptom onset: 12/25 Vaccinated: no Qualified for Infusion: diabetic, obese, hypertensive    Rexene Alberts, MSN, NP-C [facility]  [email or web address] Pager: [phone] Office: [phone] [facility] Main Line: [phone]

## 2020-11-26 ENCOUNTER — Ambulatory Visit (HOSPITAL_COMMUNITY)
Admission: RE | Admit: 2020-11-26 | Discharge: 2020-11-26 | Disposition: A | Discharge status: Home / Self Care | Payer: BC Managed Care – PPO | Source: Ambulatory Visit | Attending: Pulmonary Disease | Admitting: Pulmonary Disease

## 2020-11-26 ENCOUNTER — Other Ambulatory Visit: Payer: Self-pay

## 2020-11-26 ENCOUNTER — Telehealth: Payer: BC Managed Care – PPO | Admitting: Family Medicine

## 2020-11-26 ENCOUNTER — Ambulatory Visit (INDEPENDENT_AMBULATORY_CARE_PROVIDER_SITE_OTHER): Payer: BC Managed Care – PPO

## 2020-11-26 ENCOUNTER — Other Ambulatory Visit: Payer: Self-pay | Admitting: Infectious Diseases

## 2020-11-26 DIAGNOSIS — K760 Fatty (change of) liver, not elsewhere classified: Secondary | ICD-10-CM

## 2020-11-26 DIAGNOSIS — E6609 Other obesity due to excess calories: Secondary | ICD-10-CM | POA: Diagnosis not present

## 2020-11-26 DIAGNOSIS — U071 COVID-19: Secondary | ICD-10-CM

## 2020-11-26 DIAGNOSIS — E860 Dehydration: Secondary | ICD-10-CM

## 2020-11-26 DIAGNOSIS — R7303 Prediabetes: Secondary | ICD-10-CM

## 2020-11-26 DIAGNOSIS — R0789 Other chest pain: Secondary | ICD-10-CM | POA: Diagnosis not present

## 2020-11-26 DIAGNOSIS — R059 Cough, unspecified: Secondary | ICD-10-CM | POA: Diagnosis not present

## 2020-11-26 MED ORDER — DIPHENHYDRAMINE HCL 50 MG/ML IJ SOLN: 50.0000 mg | Freq: Once | INTRAMUSCULAR | Status: DC | PRN

## 2020-11-26 MED ORDER — SODIUM CHLORIDE 0.9 % IV SOLN: INTRAVENOUS | Status: DC | PRN

## 2020-11-26 MED ORDER — SODIUM CHLORIDE 0.9 % IV SOLN: Freq: Once | INTRAVENOUS | Status: AC

## 2020-11-26 MED ORDER — METHYLPREDNISOLONE SODIUM SUCC 125 MG IJ SOLR: 125.0000 mg | Freq: Once | INTRAMUSCULAR | Status: DC | PRN

## 2020-11-26 MED ORDER — FAMOTIDINE IN NACL 20-0.9 MG/50ML-% IV SOLN: 20.0000 mg | Freq: Once | INTRAVENOUS | Status: DC | PRN

## 2020-11-26 MED ORDER — ALBUTEROL SULFATE HFA 108 (90 BASE) MCG/ACT IN AERS: 2.0000 | INHALATION_SPRAY | Freq: Once | RESPIRATORY_TRACT | Status: DC | PRN

## 2020-11-26 MED ORDER — SODIUM CHLORIDE 0.9 % IV BOLUS
1000.0000 mL | Freq: Once | INTRAVENOUS | Status: AC
  Administered 2020-11-26: 1000 mL via INTRAVENOUS

## 2020-11-26 MED ORDER — EPINEPHRINE 0.3 MG/0.3ML IJ SOAJ: 0.3000 mg | Freq: Once | INTRAMUSCULAR | Status: DC | PRN

## 2020-11-26 NOTE — Progress Notes (Signed)
  Diagnosis: COVID-19  Physician:dr wright  Procedure: Covid Infusion Clinic Med: casirivimab\imdevimab infusion - Provided patient with casirivimab\imdevimab fact sheet for patients, parents and caregivers prior to infusion.  Complications: No immediate complications noted.  Discharge: Discharged home   Praise Dolecki S Lennin Osmond 11/26/2020   

## 2020-11-26 NOTE — Progress Notes (Signed)
Patient reviewed Fact Sheet for Patients, Parents, and Caregivers for Emergency Use Authorization (EUA) of Regen-Cov for the Treatment of Coronavirus.  Patient also reviewed and is agreeable to the estimated cost of treatment.  Patient is agreeable to proceed.   

## 2020-11-26 NOTE — Discharge Instructions (Signed)
10 Things You Can Do to Manage Your COVID-19 Symptoms at Home If you have possible or confirmed COVID-19: 1. Stay home from work and school. And stay away from other public places. If you must go out, avoid using any kind of public transportation, ridesharing, or taxis. 2. Monitor your symptoms carefully. If your symptoms get worse, call your healthcare provider immediately. 3. Get rest and stay hydrated. 4. If you have a medical appointment, call the healthcare provider ahead of time and tell them that you have or may have COVID-19. 5. For medical emergencies, call 911 and notify the dispatch personnel that you have or may have COVID-19. 6. Cover your cough and sneezes with a tissue or use the inside of your elbow. 7. Wash your hands often with soap and water for at least 20 seconds or clean your hands with an alcohol-based hand sanitizer that contains at least 60% alcohol. 8. As much as possible, stay in a specific room and away from other people in your home. Also, you should use a separate bathroom, if available. If you need to be around other people in or outside of the home, wear a mask. 9. Avoid sharing personal items with other people in your household, like dishes, towels, and bedding. 10. Clean all surfaces that are touched often, like counters, tabletops, and doorknobs. Use household cleaning sprays or wipes according to the label instructions. cdc.gov/coronavirus 05/29/2019 This information is not intended to replace advice given to you by your health care provider. Make sure you discuss any questions you have with your health care provider. Document Revised: 10/31/2019 Document Reviewed: 10/31/2019 Elsevier Patient Education  2020 Elsevier Inc. What types of side effects do monoclonal antibody drugs cause?  Common side effects  In general, the more common side effects caused by monoclonal antibody drugs include: . Allergic reactions, such as hives or itching . Flu-like signs and  symptoms, including chills, fatigue, fever, and muscle aches and pains . Nausea, vomiting . Diarrhea . Skin rashes . Low blood pressure   The CDC is recommending patients who receive monoclonal antibody treatments wait at least 90 days before being vaccinated.  Currently, there are no data on the safety and efficacy of mRNA COVID-19 vaccines in persons who received monoclonal antibodies or convalescent plasma as part of COVID-19 treatment. Based on the estimated half-life of such therapies as well as evidence suggesting that reinfection is uncommon in the 90 days after initial infection, vaccination should be deferred for at least 90 days, as a precautionary measure until additional information becomes available, to avoid interference of the antibody treatment with vaccine-induced immune responses. If you have any questions or concerns after the infusion please call the Advanced Practice Provider on call at [phone]. This number is ONLY intended for your use regarding questions or concerns about the infusion post-treatment side-effects.  Please do not provide this number to others for use. For return to work notes please contact your primary care provider.   If someone you know is interested in receiving treatment please have them call the COVID hotline at [phone].   

## 2020-11-26 NOTE — Progress Notes (Signed)
I connected by phone with Laura Johnston on 11/26/2020 at 8:40 AM to discuss the potential use of a new treatment for mild to moderate COVID-19 viral infection in non-hospitalized patients.  This patient is a 49 y.o. female that meets the FDA criteria for Emergency Use Authorization of COVID monoclonal antibody casirivimab/imdevimab, bamlanivimab/etesevimab, or sotrovimab.  Has a (+) direct SARS-CoV-2 viral test result  Has mild or moderate COVID-19   Is NOT hospitalized due to COVID-19  Is within 10 days of symptom onset  Has at least one of the high risk factor(s) for progression to severe COVID-19 and/or hospitalization as defined in EUA.  Specific high risk criteria : BMI > 25 and Other high risk medical condition per CDC:  Prediabetes, liver disease, unvaccinated   I have spoken and communicated the following to the patient or parent/caregiver regarding COVID monoclonal antibody treatment:  1. FDA has authorized the emergency use for the treatment of mild to moderate COVID-19 in adults and pediatric patients with positive results of direct SARS-CoV-2 viral testing who are 72 years of age and older weighing at least 40 kg, and who are at high risk for progressing to severe COVID-19 and/or hospitalization.  2. The significant known and potential risks and benefits of COVID monoclonal antibody, and the extent to which such potential risks and benefits are unknown.  3. Information on available alternative treatments and the risks and benefits of those alternatives, including clinical trials.  4. Patients treated with COVID monoclonal antibody should continue to self-isolate and use infection control measures (e.g., wear mask, isolate, social distance, avoid sharing personal items, clean and disinfect "high touch" surfaces, and frequent handwashing) according to CDC guidelines.   5. The patient or parent/caregiver has the option to accept or refuse COVID monoclonal antibody  treatment.  After reviewing this information with the patient, the patient has agreed to receive one of the available covid 19 monoclonal antibodies and will be provided an appropriate fact sheet prior to infusion.   Rexene Alberts, NP 11/26/2020 8:40 AM

## 2020-11-27 ENCOUNTER — Encounter (INDEPENDENT_AMBULATORY_CARE_PROVIDER_SITE_OTHER): Payer: Self-pay

## 2020-11-28 ENCOUNTER — Encounter (INDEPENDENT_AMBULATORY_CARE_PROVIDER_SITE_OTHER): Payer: Self-pay

## 2020-11-30 ENCOUNTER — Encounter: Payer: Self-pay | Admitting: Family Medicine

## 2020-12-02 ENCOUNTER — Encounter: Payer: Self-pay | Admitting: Family Medicine

## 2020-12-02 ENCOUNTER — Telehealth: Payer: Self-pay | Admitting: *Deleted

## 2020-12-02 NOTE — Telephone Encounter (Signed)
Have him schedule a virtual.

## 2020-12-02 NOTE — Telephone Encounter (Signed)
Patient called after receiving BPA for new onset cough. Patient states she was unsure if cough was related to the heat in the home or COVID. Patient states that she has been taking Robitussin with some improvement. Patient advised to use over the counter medications to treat the cough drops and to try to drink warm fluids. Patient also advised to try a humidifier to see if it helps improve the dry air in the home. Patient advised to notify PCP if symptoms become worse or do not improve with using over the counter medications. Understanding verbalized.

## 2020-12-03 NOTE — Telephone Encounter (Signed)
Patient called.

## 2020-12-04 ENCOUNTER — Other Ambulatory Visit: Payer: Self-pay | Admitting: Physician Assistant

## 2020-12-07 ENCOUNTER — Telehealth: Payer: Self-pay | Admitting: *Deleted

## 2020-12-07 NOTE — Telephone Encounter (Signed)
Patient reported she has a worsening cough since diagnosis of covid. Recommended if cough remains the same or better: continue to treat with over the counter medications. hard candy or cough drops and drinking warm fluids. Encouraged patient to contact pharmacist for recommendations for OTC cough medications.  Adults can also use honey 2 tsp at bedtime. Patient verbalized understanding .

## 2020-12-28 DIAGNOSIS — M4714 Other spondylosis with myelopathy, thoracic region: Secondary | ICD-10-CM | POA: Diagnosis not present

## 2020-12-28 DIAGNOSIS — M5104 Intervertebral disc disorders with myelopathy, thoracic region: Secondary | ICD-10-CM | POA: Diagnosis not present

## 2020-12-28 DIAGNOSIS — M47814 Spondylosis without myelopathy or radiculopathy, thoracic region: Secondary | ICD-10-CM | POA: Diagnosis not present

## 2021-01-21 DIAGNOSIS — Z01812 Encounter for preprocedural laboratory examination: Secondary | ICD-10-CM | POA: Diagnosis not present

## 2021-01-21 DIAGNOSIS — M5104 Intervertebral disc disorders with myelopathy, thoracic region: Secondary | ICD-10-CM | POA: Diagnosis not present

## 2021-01-21 DIAGNOSIS — Z01810 Encounter for preprocedural cardiovascular examination: Secondary | ICD-10-CM | POA: Diagnosis not present

## 2021-01-21 DIAGNOSIS — M4714 Other spondylosis with myelopathy, thoracic region: Secondary | ICD-10-CM | POA: Diagnosis not present

## 2021-01-21 DIAGNOSIS — Z01818 Encounter for other preprocedural examination: Secondary | ICD-10-CM | POA: Diagnosis not present

## 2021-01-27 DIAGNOSIS — M5104 Intervertebral disc disorders with myelopathy, thoracic region: Secondary | ICD-10-CM | POA: Diagnosis not present

## 2021-01-27 DIAGNOSIS — K219 Gastro-esophageal reflux disease without esophagitis: Secondary | ICD-10-CM | POA: Diagnosis not present

## 2021-01-27 DIAGNOSIS — Z87891 Personal history of nicotine dependence: Secondary | ICD-10-CM | POA: Diagnosis not present

## 2021-01-27 DIAGNOSIS — Z888 Allergy status to other drugs, medicaments and biological substances status: Secondary | ICD-10-CM | POA: Diagnosis not present

## 2021-01-27 DIAGNOSIS — M4714 Other spondylosis with myelopathy, thoracic region: Secondary | ICD-10-CM | POA: Diagnosis not present

## 2021-01-27 DIAGNOSIS — E669 Obesity, unspecified: Secondary | ICD-10-CM | POA: Diagnosis not present

## 2021-01-27 DIAGNOSIS — Z6830 Body mass index (BMI) 30.0-30.9, adult: Secondary | ICD-10-CM | POA: Diagnosis not present

## 2021-01-27 DIAGNOSIS — E278 Other specified disorders of adrenal gland: Secondary | ICD-10-CM | POA: Diagnosis not present

## 2021-01-27 DIAGNOSIS — N281 Cyst of kidney, acquired: Secondary | ICD-10-CM | POA: Diagnosis not present

## 2021-01-27 DIAGNOSIS — M5124 Other intervertebral disc displacement, thoracic region: Secondary | ICD-10-CM | POA: Diagnosis not present

## 2021-01-27 DIAGNOSIS — Z79899 Other long term (current) drug therapy: Secondary | ICD-10-CM | POA: Diagnosis not present

## 2021-01-27 DIAGNOSIS — K760 Fatty (change of) liver, not elsewhere classified: Secondary | ICD-10-CM | POA: Diagnosis not present

## 2021-01-27 DIAGNOSIS — R1031 Right lower quadrant pain: Secondary | ICD-10-CM | POA: Diagnosis not present

## 2021-01-27 DIAGNOSIS — E7800 Pure hypercholesterolemia, unspecified: Secondary | ICD-10-CM | POA: Diagnosis not present

## 2021-01-27 DIAGNOSIS — M5114 Intervertebral disc disorders with radiculopathy, thoracic region: Secondary | ICD-10-CM | POA: Diagnosis not present

## 2021-01-27 DIAGNOSIS — E6609 Other obesity due to excess calories: Secondary | ICD-10-CM | POA: Diagnosis not present

## 2021-01-27 DIAGNOSIS — R102 Pelvic and perineal pain: Secondary | ICD-10-CM | POA: Diagnosis not present

## 2021-01-27 DIAGNOSIS — G959 Disease of spinal cord, unspecified: Secondary | ICD-10-CM | POA: Diagnosis not present

## 2021-01-28 DIAGNOSIS — Z888 Allergy status to other drugs, medicaments and biological substances status: Secondary | ICD-10-CM | POA: Diagnosis not present

## 2021-01-28 DIAGNOSIS — K760 Fatty (change of) liver, not elsewhere classified: Secondary | ICD-10-CM | POA: Diagnosis not present

## 2021-01-28 DIAGNOSIS — Z6830 Body mass index (BMI) 30.0-30.9, adult: Secondary | ICD-10-CM | POA: Diagnosis not present

## 2021-01-28 DIAGNOSIS — Z79899 Other long term (current) drug therapy: Secondary | ICD-10-CM | POA: Diagnosis not present

## 2021-01-28 DIAGNOSIS — Z87891 Personal history of nicotine dependence: Secondary | ICD-10-CM | POA: Diagnosis not present

## 2021-01-28 DIAGNOSIS — M4714 Other spondylosis with myelopathy, thoracic region: Secondary | ICD-10-CM | POA: Diagnosis not present

## 2021-01-28 DIAGNOSIS — R102 Pelvic and perineal pain: Secondary | ICD-10-CM | POA: Diagnosis not present

## 2021-01-28 DIAGNOSIS — M5114 Intervertebral disc disorders with radiculopathy, thoracic region: Secondary | ICD-10-CM | POA: Diagnosis not present

## 2021-01-28 DIAGNOSIS — K219 Gastro-esophageal reflux disease without esophagitis: Secondary | ICD-10-CM | POA: Diagnosis not present

## 2021-01-28 DIAGNOSIS — E669 Obesity, unspecified: Secondary | ICD-10-CM | POA: Diagnosis not present

## 2021-01-28 DIAGNOSIS — E7800 Pure hypercholesterolemia, unspecified: Secondary | ICD-10-CM | POA: Diagnosis not present

## 2021-01-28 DIAGNOSIS — M5104 Intervertebral disc disorders with myelopathy, thoracic region: Secondary | ICD-10-CM | POA: Diagnosis not present

## 2021-01-28 DIAGNOSIS — E6609 Other obesity due to excess calories: Secondary | ICD-10-CM | POA: Diagnosis not present

## 2021-01-28 DIAGNOSIS — R1031 Right lower quadrant pain: Secondary | ICD-10-CM | POA: Diagnosis not present

## 2021-01-29 ENCOUNTER — Telehealth: Payer: Self-pay | Admitting: *Deleted

## 2021-01-29 NOTE — Telephone Encounter (Signed)
Spoke with pt and the surgeon's office actually called her back and suggested Miralax with a stool softener. She stated that she IS able to void but she just has to push a little harder and it's hard to push because she's in a lot of pain.  I voiced concern for possible cord compression and she said they did a CT after her surgery and spinal cord was not compressed at all.  I feel better now knowing that the surgeon's office followed through with her.  Just wanted to let you know.

## 2021-01-29 NOTE — Telephone Encounter (Signed)
Pt left vm stating that she had spine surgery yesterday and is now having a hard time going to the bathroom (voiding and stooling).  She stated that her surgeons office told her to call here for advice as they are closed this afternoon.  A nurse from [facility] left a vm as well letting us know the exact same information and to let us know that the pt is expecting a call from our office. Please advise on what pt needs to do next.

## 2021-02-16 DIAGNOSIS — R2689 Other abnormalities of gait and mobility: Secondary | ICD-10-CM | POA: Diagnosis not present

## 2021-02-16 DIAGNOSIS — R202 Paresthesia of skin: Secondary | ICD-10-CM | POA: Diagnosis not present

## 2021-02-16 DIAGNOSIS — M6289 Other specified disorders of muscle: Secondary | ICD-10-CM | POA: Diagnosis not present

## 2021-02-16 DIAGNOSIS — M545 Low back pain, unspecified: Secondary | ICD-10-CM | POA: Diagnosis not present

## 2021-02-24 DIAGNOSIS — M5104 Intervertebral disc disorders with myelopathy, thoracic region: Secondary | ICD-10-CM | POA: Diagnosis not present

## 2021-02-24 DIAGNOSIS — M4714 Other spondylosis with myelopathy, thoracic region: Secondary | ICD-10-CM | POA: Diagnosis not present

## 2021-02-24 DIAGNOSIS — M47814 Spondylosis without myelopathy or radiculopathy, thoracic region: Secondary | ICD-10-CM | POA: Diagnosis not present

## 2021-02-26 DIAGNOSIS — R202 Paresthesia of skin: Secondary | ICD-10-CM | POA: Diagnosis not present

## 2021-02-26 DIAGNOSIS — R200 Anesthesia of skin: Secondary | ICD-10-CM | POA: Diagnosis not present

## 2021-02-26 DIAGNOSIS — M545 Low back pain, unspecified: Secondary | ICD-10-CM | POA: Diagnosis not present

## 2021-03-03 DIAGNOSIS — M545 Low back pain, unspecified: Secondary | ICD-10-CM | POA: Diagnosis not present

## 2021-03-03 DIAGNOSIS — R202 Paresthesia of skin: Secondary | ICD-10-CM | POA: Diagnosis not present

## 2021-03-03 DIAGNOSIS — R200 Anesthesia of skin: Secondary | ICD-10-CM | POA: Diagnosis not present

## 2021-03-05 DIAGNOSIS — R200 Anesthesia of skin: Secondary | ICD-10-CM | POA: Diagnosis not present

## 2021-03-05 DIAGNOSIS — R202 Paresthesia of skin: Secondary | ICD-10-CM | POA: Diagnosis not present

## 2021-03-05 DIAGNOSIS — M545 Low back pain, unspecified: Secondary | ICD-10-CM | POA: Diagnosis not present

## 2021-03-09 DIAGNOSIS — R202 Paresthesia of skin: Secondary | ICD-10-CM | POA: Diagnosis not present

## 2021-03-09 DIAGNOSIS — M545 Low back pain, unspecified: Secondary | ICD-10-CM | POA: Diagnosis not present

## 2021-03-09 DIAGNOSIS — R200 Anesthesia of skin: Secondary | ICD-10-CM | POA: Diagnosis not present

## 2021-03-11 DIAGNOSIS — M545 Low back pain, unspecified: Secondary | ICD-10-CM | POA: Diagnosis not present

## 2021-03-11 DIAGNOSIS — R200 Anesthesia of skin: Secondary | ICD-10-CM | POA: Diagnosis not present

## 2021-03-11 DIAGNOSIS — R202 Paresthesia of skin: Secondary | ICD-10-CM | POA: Diagnosis not present

## 2021-03-17 DIAGNOSIS — R202 Paresthesia of skin: Secondary | ICD-10-CM | POA: Diagnosis not present

## 2021-03-17 DIAGNOSIS — M545 Low back pain, unspecified: Secondary | ICD-10-CM | POA: Diagnosis not present

## 2021-03-17 DIAGNOSIS — R200 Anesthesia of skin: Secondary | ICD-10-CM | POA: Diagnosis not present

## 2021-03-31 DIAGNOSIS — M5104 Intervertebral disc disorders with myelopathy, thoracic region: Secondary | ICD-10-CM | POA: Diagnosis not present

## 2021-03-31 DIAGNOSIS — M4714 Other spondylosis with myelopathy, thoracic region: Secondary | ICD-10-CM | POA: Diagnosis not present

## 2021-03-31 DIAGNOSIS — M47814 Spondylosis without myelopathy or radiculopathy, thoracic region: Secondary | ICD-10-CM | POA: Diagnosis not present

## 2021-04-07 DIAGNOSIS — M47814 Spondylosis without myelopathy or radiculopathy, thoracic region: Secondary | ICD-10-CM | POA: Diagnosis not present

## 2021-04-07 DIAGNOSIS — Z09 Encounter for follow-up examination after completed treatment for conditions other than malignant neoplasm: Secondary | ICD-10-CM | POA: Diagnosis not present

## 2021-04-07 DIAGNOSIS — M5104 Intervertebral disc disorders with myelopathy, thoracic region: Secondary | ICD-10-CM | POA: Diagnosis not present

## 2021-04-07 DIAGNOSIS — M4804 Spinal stenosis, thoracic region: Secondary | ICD-10-CM | POA: Diagnosis not present

## 2021-04-12 DIAGNOSIS — Z09 Encounter for follow-up examination after completed treatment for conditions other than malignant neoplasm: Secondary | ICD-10-CM | POA: Diagnosis not present

## 2021-04-12 DIAGNOSIS — M47814 Spondylosis without myelopathy or radiculopathy, thoracic region: Secondary | ICD-10-CM | POA: Diagnosis not present

## 2021-04-12 DIAGNOSIS — M4804 Spinal stenosis, thoracic region: Secondary | ICD-10-CM | POA: Diagnosis not present

## 2021-04-12 DIAGNOSIS — M5104 Intervertebral disc disorders with myelopathy, thoracic region: Secondary | ICD-10-CM | POA: Diagnosis not present

## 2021-04-16 DIAGNOSIS — M47814 Spondylosis without myelopathy or radiculopathy, thoracic region: Secondary | ICD-10-CM | POA: Diagnosis not present

## 2021-04-16 DIAGNOSIS — M4804 Spinal stenosis, thoracic region: Secondary | ICD-10-CM | POA: Diagnosis not present

## 2021-04-16 DIAGNOSIS — M5104 Intervertebral disc disorders with myelopathy, thoracic region: Secondary | ICD-10-CM | POA: Diagnosis not present

## 2021-04-16 DIAGNOSIS — Z09 Encounter for follow-up examination after completed treatment for conditions other than malignant neoplasm: Secondary | ICD-10-CM | POA: Diagnosis not present

## 2021-04-20 DIAGNOSIS — Z09 Encounter for follow-up examination after completed treatment for conditions other than malignant neoplasm: Secondary | ICD-10-CM | POA: Diagnosis not present

## 2021-04-20 DIAGNOSIS — M4804 Spinal stenosis, thoracic region: Secondary | ICD-10-CM | POA: Diagnosis not present

## 2021-04-20 DIAGNOSIS — M5104 Intervertebral disc disorders with myelopathy, thoracic region: Secondary | ICD-10-CM | POA: Diagnosis not present

## 2021-04-20 DIAGNOSIS — M47814 Spondylosis without myelopathy or radiculopathy, thoracic region: Secondary | ICD-10-CM | POA: Diagnosis not present

## 2021-04-22 DIAGNOSIS — M47814 Spondylosis without myelopathy or radiculopathy, thoracic region: Secondary | ICD-10-CM | POA: Diagnosis not present

## 2021-04-22 DIAGNOSIS — M5104 Intervertebral disc disorders with myelopathy, thoracic region: Secondary | ICD-10-CM | POA: Diagnosis not present

## 2021-04-22 DIAGNOSIS — M4804 Spinal stenosis, thoracic region: Secondary | ICD-10-CM | POA: Diagnosis not present

## 2021-04-22 DIAGNOSIS — Z09 Encounter for follow-up examination after completed treatment for conditions other than malignant neoplasm: Secondary | ICD-10-CM | POA: Diagnosis not present

## 2021-04-26 ENCOUNTER — Emergency Department: Admit: 2021-04-26 | Discharge status: Absent without leave | Payer: Self-pay

## 2021-04-26 ENCOUNTER — Other Ambulatory Visit: Payer: Self-pay

## 2021-04-26 ENCOUNTER — Encounter: Payer: Self-pay | Admitting: Emergency Medicine

## 2021-04-26 ENCOUNTER — Emergency Department (INDEPENDENT_AMBULATORY_CARE_PROVIDER_SITE_OTHER)
Admission: EM | Admit: 2021-04-26 | Discharge: 2021-04-26 | Disposition: A | Discharge status: Home / Self Care | Payer: BC Managed Care – PPO | Source: Home / Self Care | Attending: Family Medicine | Admitting: Family Medicine

## 2021-04-26 DIAGNOSIS — J029 Acute pharyngitis, unspecified: Secondary | ICD-10-CM

## 2021-04-26 LAB — POCT RAPID STREP A (OFFICE): Rapid Strep A Screen: NEGATIVE

## 2021-04-26 NOTE — ED Provider Notes (Signed)
[name]    CSN: [id number] Arrival date & time: 04/26/21  1004      History   Chief Complaint Chief Complaint  Patient presents with  . Sore Throat    HPI Laura Johnston is a 50 y.o. female.   Patient complains of onset of sore throat last night, without other symptoms.  She states that her son developed a sore throat three days ago, followed by nasal drainage and cough.  The history is provided by the patient.    Past Medical History:  Diagnosis Date  . Abnormal Pap smear   . Allergy    mild   . AMA (advanced maternal age) multigravida 34+   . Arthritis    knees, back   . GERD (gastroesophageal reflux disease)   . Gestational diabetes    no meds - had gestational diabetes ONLY   . Osteopenia   . Prediabetes 01/23/2018  . Vaginal delivery 08/19/2011    Patient Active Problem List   Diagnosis Date Noted  . Spinal cord compression, thoracic 01/03/2020  . Cramp in lower extremity associated with sleep 12/04/2019  . First degree ankle sprain, left, initial encounter 04/13/2018  . 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 Class 1 obesity due to excess calories with serious comorbidity in adult 01/31/2018  . Prediabetes 01/23/2018  . Eye muscle twitches 01/22/2018  . Recurrent upper respiratory tract infection 01/22/2018  . Heart palpitations 05/17/2017  . Primary osteoarthritis of both knees 03/02/2017  . Primary osteoarthritis of right knee 03/02/2017  . Fatty liver disease, nonalcoholic 98/92/1194  . Vitamin D deficiency 07/20/2016  . Hypertriglyceridemia 03/08/2010  . DIZZINESS 02/11/2010    Past Surgical History:  Procedure Laterality Date  . APPENDECTOMY    . COLONOSCOPY  09/04/2020  . KNEE SURGERY    . LEEP    . TUMOR REMOVAL     from buttocks    OB History    Gravida  2   Para  1   Term  1   Preterm  0   AB  1   Living  1     SAB  1   IAB  0   Ectopic  0   Multiple  0   Live Births  1            Home  Medications    Prior to Admission medications   Medication Sig Start Date End Date Taking? Authorizing Provider  albuterol (PROVENTIL) (2.5 MG/3ML) 0.083% nebulizer solution Take 3 mLs (2.5 mg total) by nebulization every 6 (six) hours as needed for wheezing or shortness of breath. 11/24/20  Yes Robyn Haber, MD  albuterol (VENTOLIN HFA) 108 (90 Base) MCG/ACT inhaler Inhale 1-2 puffs into the lungs every 6 (six) hours as needed for wheezing or shortness of breath. 11/24/20  Yes Robyn Haber, MD  celecoxib (CELEBREX) 200 MG capsule One to 2 tablets by mouth daily as needed for pain. 11/06/19  Yes [provider]  chlorpheniramine-HYDROcodone (TUSSIONEX PENNKINETIC ER) 10-8 MG/5ML SUER Take 5 mLs by mouth every 12 (twelve) hours as needed for cough. 11/24/20   Robyn Haber, MD  cyclobenzaprine (FLEXERIL) 10 MG tablet Take by mouth. 06/25/19   [provider]  dexamethasone (DECADRON) 4 MG tablet Take 1 tablet (4 mg total) by mouth 2 (two) times daily with a meal. 11/24/20   Robyn Haber, MD  gabapentin (NEURONTIN) 300 MG capsule One tab PO qHS for a week, then BID for a week, then TID. May double  weekly to a max of 3,600mg /day 10/28/20   Silverio Decamp, MD  guaiFENesin-codeine [facility]) 100-10 MG/5ML syrup Take by mouth. 04/28/20   [provider]  HYDROcodone-acetaminophen (NORCO/VICODIN) 5-325 MG tablet Take by mouth. 01/03/20   [provider]  ibuprofen (ADVIL) 600 MG tablet Take 1 tablet (600 mg total) by mouth every 8 (eight) hours as needed. 05/01/20   Samuel Bouche, NP  ipratropium (ATROVENT) 0.03 % nasal spray Place into the nose. 04/28/20   [provider]  magnesium 30 MG tablet Take 30 mg by mouth daily.    [provider]  magnesium oxide (MAG-OX) 400 MG tablet Take by mouth. 12/04/19   [provider]  metoprolol succinate (TOPROL-XL) 25 MG 24 hr tablet Take by mouth. 05/17/19   [provider]   Multiple Vitamins-Minerals (MULTIVITAMIN WOMEN 50+ PO) Take by mouth.    [provider]  Vitamin D, Ergocalciferol, (DRISDOL) 1.25 MG (50000 UNIT) CAPS capsule Take 50,000 Units by mouth once a week. 07/27/20   [provider]    Family History Family History  Problem Relation Age of Onset  . Stroke Father   . Hypertension Father   . Hyperlipidemia Father   . Heart disease Father   . Diabetes Maternal Uncle   . Stroke Paternal Grandmother   . Colon polyps Mother   . Colon cancer Neg Hx   . Esophageal cancer Neg Hx   . Rectal cancer Neg Hx   . Stomach cancer Neg Hx     Social History Social History   Tobacco Use  . Smoking status: Former Smoker    Years: 2.00    Types: Cigarettes    Quit date: 01/23/2016    Years since quitting: 5.2  . Smokeless tobacco: Never Used  . Tobacco comment: 1-3 cig/day  Vaping Use  . Vaping Use: Never used  Substance Use Topics  . Alcohol use: No  . Drug use: No     Allergies   Cortisone, Hydrocortisone, Meloxicam, Meloxicam, and Pseudoephedrine   Review of Systems Review of Systems + sore throat No cough No pleuritic pain No wheezing No nasal congestion No post-nasal drainage No sinus pain/pressure No itchy/red eyes No earache No hemoptysis No SOB No fever/chills No nausea No vomiting No abdominal pain No diarrhea No urinary symptoms No skin rash No fatigue No myalgias No headache   Physical Exam Triage Vital Signs ED Triage Vitals [04/26/21 1050]  Enc Vitals Group     BP 130/89     Pulse Rate 92     Resp      Temp 98.5 F (36.9 C)     Temp Source Oral     SpO2 98 %     Weight      Height      Head Circumference      Peak Flow      Pain Score 5     Pain Loc      Pain Edu?      Excl. in [place]?    No data found.  Updated Vital Signs BP 130/89 (BP Location: Left Arm)   Pulse 92   Temp 98.5 F (36.9 C) (Oral)   LMP 10/25/2011   SpO2 98%   Visual Acuity Right Eye Distance:   Left  Eye Distance:   Bilateral Distance:    Right Eye Near:   Left Eye Near:    Bilateral Near:     Physical Exam Nursing notes and Vital Signs reviewed.  Appearance:  Patient appears stated age, and in no acute distress Eyes:  Pupils are equal, round, and reactive to light and accomodation.  Extraocular movement is intact.  Conjunctivae are not inflamed  Ears:  Canals normal.  Tympanic membranes normal.  Nose:  Mildly congested turbinates.  No sinus tenderness.  Pharynx:  Normal Neck:  Supple.  Mildly enlarged lateral nodes are present, tender to palpation on the left.   Lungs:  Clear to auscultation.  Breath sounds are equal.  Moving air well. Heart:  Regular rate and rhythm without murmurs, rubs, or gallops.  Abdomen:  Nontender without masses or hepatosplenomegaly.  Bowel sounds are present.  No CVA or flank tenderness.  Extremities:  No edema.  Skin:  No rash present.   UC Treatments / Results  Labs (all labs ordered are listed, but only abnormal results are displayed) Labs Reviewed  SARS-COV-2 RNA,(COVID-19) QUALITATIVE NAAT  POCT RAPID STREP A (OFFICE) negative    EKG   Radiology No results found.  Procedures Procedures (including critical care time)  Medications Ordered in UC Medications - No data to display  Initial Impression / Assessment and Plan / UC Course  I have reviewed the triage vital signs and the nursing notes.  Pertinent labs & imaging results that were available during my care of the patient were reviewed by me and considered in my medical decision making (see chart for details).    Benign exam.  There is no evidence of bacterial infection today.  Treat symptomatically for now  COVID19 PCR pending. Followup with Family Doctor if not improved in about 10 days.   Final Clinical Impressions(s) / UC Diagnoses   Final diagnoses:  Acute pharyngitis, unspecified etiology     Discharge Instructions     Take plain guaifenesin (1200mg  extended release  tabs such as Mucinex) twice daily, with plenty of water, for cough and congestion.  Get adequate rest.   May use Afrin nasal spray (or generic oxymetazoline) each morning for about 5 days and then discontinue.  Also recommend using saline nasal spray several times daily and saline nasal irrigation (AYR is a common brand).  Use Flonase nasal spray each morning after using Afrin nasal spray and saline nasal irrigation. Try warm salt water gargles for sore throat.  Stop all antihistamines for now, and other non-prescription cough/cold preparations. May take Tylenol as needed for fever, sore throat, etc. May take Delsym Cough Suppressant ("12 Hour Cough Relief") at bedtime for nighttime cough.    If your COVID-19 test is positive, isolate yourself for five days from the time of your symptom onset.  At the end of five days you may end isolation if your symptoms have cleared or improved, and you have not had a fever for 24 hours. At this time you should wear a mask for five more days when you are around others.  If symptoms become significantly worse during the night or over the weekend, proceed to the local emergency room.             ED Prescriptions    None        Kandra Nicolas, MD 04/27/21 1857

## 2021-04-26 NOTE — ED Triage Notes (Signed)
Patient c/o sore throat since last night.  No other sx's.  Patient has received her first injection for COVID vaccination.

## 2021-04-26 NOTE — Discharge Instructions (Addendum)
Take plain guaifenesin (1200mg  extended release tabs such as Mucinex) twice daily, with plenty of water, for cough and congestion.  Get adequate rest.   May use Afrin nasal spray (or generic oxymetazoline) each morning for about 5 days and then discontinue.  Also recommend using saline nasal spray several times daily and saline nasal irrigation (AYR is a common brand).  Use Flonase nasal spray each morning after using Afrin nasal spray and saline nasal irrigation. Try warm salt water gargles for sore throat.  Stop all antihistamines for now, and other non-prescription cough/cold preparations. May take Tylenol as needed for fever, sore throat, etc. May take Delsym Cough Suppressant ("12 Hour Cough Relief") at bedtime for nighttime cough.    If your COVID-19 test is positive, isolate yourself for five days from the time of your symptom onset.  At the end of five days you may end isolation if your symptoms have cleared or improved, and you have not had a fever for 24 hours. At this time you should wear a mask for five more days when you are around others.  If symptoms become significantly worse during the night or over the weekend, proceed to the local emergency room.

## 2021-04-27 ENCOUNTER — Telehealth: Payer: BC Managed Care – PPO | Admitting: Family Medicine

## 2021-04-28 ENCOUNTER — Telehealth (INDEPENDENT_AMBULATORY_CARE_PROVIDER_SITE_OTHER): Payer: BC Managed Care – PPO | Admitting: Sports Medicine

## 2021-04-28 ENCOUNTER — Telehealth: Payer: Self-pay | Admitting: Family Medicine

## 2021-04-28 ENCOUNTER — Other Ambulatory Visit: Payer: Self-pay

## 2021-04-28 ENCOUNTER — Other Ambulatory Visit: Payer: Self-pay | Admitting: *Deleted

## 2021-04-28 ENCOUNTER — Ambulatory Visit (INDEPENDENT_AMBULATORY_CARE_PROVIDER_SITE_OTHER): Payer: BC Managed Care – PPO

## 2021-04-28 DIAGNOSIS — R059 Cough, unspecified: Secondary | ICD-10-CM

## 2021-04-28 DIAGNOSIS — R0989 Other specified symptoms and signs involving the circulatory and respiratory systems: Secondary | ICD-10-CM

## 2021-04-28 DIAGNOSIS — R0602 Shortness of breath: Secondary | ICD-10-CM

## 2021-04-28 LAB — SARS-COV-2 RNA,(COVID-19) QUALITATIVE NAAT: SARS CoV2 RNA: NOT DETECTED

## 2021-04-28 MED ORDER — BENZONATATE 200 MG PO CAPS: 200.0000 mg | ORAL_CAPSULE | Freq: Three times a day (TID) | ORAL | 0 refills | Status: DC | PRN

## 2021-04-28 MED ORDER — PAXLOVID 20 X 150 MG & 10 X 100MG PO TBPK: 3.0000 | ORAL_TABLET | Freq: Two times a day (BID) | ORAL | 0 refills | Status: AC

## 2021-04-28 NOTE — Telephone Encounter (Signed)
Chest x-ray ordered for worsening shortness of breath.

## 2021-04-28 NOTE — Progress Notes (Signed)
   Virtual Visit via Telephone   I connected with  Laura Johnston  on 04/28/21 by telephone/telehealth and verified that I am speaking with the correct person using two identifiers.   I discussed the limitations, risks, security and privacy concerns of performing an evaluation and management service by telephone, including the higher likelihood of inaccurate diagnosis and treatment, and the availability of in person appointments.  We also discussed the likely need of an additional face to face encounter for complete and high quality delivery of care.  I also discussed with the patient that there may be a patient responsible charge related to this service. The patient expressed understanding and wishes to proceed.  Provider location is in medical facility. Patient location is at their home, different from provider location. People involved in care of the patient during this telehealth encounter were myself, my nurse/medical assistant, and my front office/scheduling team member.  Review of Systems: No fevers, chills, night sweats, weight loss, chest pain, or shortness of breath.   Objective Findings:    General: Speaking full sentences, no audible heavy breathing.  Sounds alert and appropriately interactive.    Independent interpretation of tests performed by another provider:   None.  Brief History, Exam, Impression, and Recommendations:    Cough This is a 50 year old female, for the past couple days she has had increasing cough, fevers, headaches, muscle aches, body aches, Johnston son was sick and she got the same thing. She was seen in urgent care on 30 May, a COVID test was obtained, the results are not available yet. She has had COVID in the past, back in December of last year, she also had Johnston first COVID-vaccine about 2 and half weeks ago. She probably does have COVID-19, we will add Paxlovid, Tessalon Perles, and she will come in for a chest x-ray ordered by Johnston PCP. She will also  get a rapid COVID test from [facility] pharmacy to get an answer today. She was speaking full sentences on the phone, no obvious respiratory distress. She can use over-the-counter cold and flu medications for symptomatic relief in the meantime.   I discussed the above assessment and treatment plan with the patient. The patient was provided an opportunity to ask questions and all were answered. The patient agreed with the plan and demonstrated an understanding of the instructions.   The patient was advised to call back or seek an in-person evaluation if the symptoms worsen or if the condition fails to improve as anticipated.   I provided 30 minutes of verbal and non-verbal time during this encounter date, time was needed to gather information, review chart, records, communicate/coordinate with staff remotely, as well as complete documentation.   ___________________________________________ Laura Johnston. Dianah Field, M.D., ABFM., CAQSM. Primary Care and Sports Medicine [facility] [address]  Adjunct Professor of [employer]

## 2021-04-28 NOTE — Assessment & Plan Note (Addendum)
This is a 50 year old female, for the past couple days she has had increasing cough, fevers, headaches, muscle aches, body aches, her son was sick and she got the same thing. She was seen in urgent care on 30 May, a COVID test was obtained, the results are not available yet. She has had COVID in the past, back in December of last year, she also had her first COVID-vaccine about 2 and half weeks ago. She probably does have COVID-19, we will add Paxlovid, Tessalon Perles, and she will come in for a chest x-ray ordered by her PCP. She will also get a rapid COVID test from her pharmacy to get an answer today. She was speaking full sentences on the phone, no obvious respiratory distress. She can use over-the-counter cold and flu medications for symptomatic relief in the meantime.

## 2021-04-29 ENCOUNTER — Encounter: Payer: Self-pay | Admitting: Family Medicine

## 2021-04-29 MED ORDER — HYDROCODONE BIT-HOMATROP MBR 5-1.5 MG/5ML PO SOLN: 5.0000 mL | Freq: Every evening | ORAL | 0 refills | Status: DC | PRN

## 2021-04-29 NOTE — Telephone Encounter (Signed)
Cough med sent. [place] for work note.  If she has additional concerns it probably needs to be sent to Dr. Darene Lamer as he saw her yesterday.

## 2021-05-04 DIAGNOSIS — M4714 Other spondylosis with myelopathy, thoracic region: Secondary | ICD-10-CM | POA: Diagnosis not present

## 2021-05-04 DIAGNOSIS — M545 Low back pain, unspecified: Secondary | ICD-10-CM | POA: Diagnosis not present

## 2021-05-04 DIAGNOSIS — M5104 Intervertebral disc disorders with myelopathy, thoracic region: Secondary | ICD-10-CM | POA: Diagnosis not present

## 2021-05-04 DIAGNOSIS — R29898 Other symptoms and signs involving the musculoskeletal system: Secondary | ICD-10-CM | POA: Diagnosis not present

## 2021-05-06 DIAGNOSIS — R29898 Other symptoms and signs involving the musculoskeletal system: Secondary | ICD-10-CM | POA: Diagnosis not present

## 2021-05-06 DIAGNOSIS — M545 Low back pain, unspecified: Secondary | ICD-10-CM | POA: Diagnosis not present

## 2021-05-06 DIAGNOSIS — M4714 Other spondylosis with myelopathy, thoracic region: Secondary | ICD-10-CM | POA: Diagnosis not present

## 2021-05-06 DIAGNOSIS — M5104 Intervertebral disc disorders with myelopathy, thoracic region: Secondary | ICD-10-CM | POA: Diagnosis not present

## 2021-05-11 DIAGNOSIS — R29898 Other symptoms and signs involving the musculoskeletal system: Secondary | ICD-10-CM | POA: Diagnosis not present

## 2021-05-11 DIAGNOSIS — M5104 Intervertebral disc disorders with myelopathy, thoracic region: Secondary | ICD-10-CM | POA: Diagnosis not present

## 2021-05-11 DIAGNOSIS — M4714 Other spondylosis with myelopathy, thoracic region: Secondary | ICD-10-CM | POA: Diagnosis not present

## 2021-05-11 DIAGNOSIS — M545 Low back pain, unspecified: Secondary | ICD-10-CM | POA: Diagnosis not present

## 2021-05-14 DIAGNOSIS — M4714 Other spondylosis with myelopathy, thoracic region: Secondary | ICD-10-CM | POA: Diagnosis not present

## 2021-05-14 DIAGNOSIS — M5104 Intervertebral disc disorders with myelopathy, thoracic region: Secondary | ICD-10-CM | POA: Diagnosis not present

## 2021-05-14 DIAGNOSIS — R29898 Other symptoms and signs involving the musculoskeletal system: Secondary | ICD-10-CM | POA: Diagnosis not present

## 2021-05-14 DIAGNOSIS — M545 Low back pain, unspecified: Secondary | ICD-10-CM | POA: Diagnosis not present

## 2021-05-18 DIAGNOSIS — R29898 Other symptoms and signs involving the musculoskeletal system: Secondary | ICD-10-CM | POA: Diagnosis not present

## 2021-05-18 DIAGNOSIS — M545 Low back pain, unspecified: Secondary | ICD-10-CM | POA: Diagnosis not present

## 2021-05-20 DIAGNOSIS — M545 Low back pain, unspecified: Secondary | ICD-10-CM | POA: Diagnosis not present

## 2021-05-20 DIAGNOSIS — R29898 Other symptoms and signs involving the musculoskeletal system: Secondary | ICD-10-CM | POA: Diagnosis not present

## 2021-05-25 DIAGNOSIS — R29898 Other symptoms and signs involving the musculoskeletal system: Secondary | ICD-10-CM | POA: Diagnosis not present

## 2021-05-25 DIAGNOSIS — M545 Low back pain, unspecified: Secondary | ICD-10-CM | POA: Diagnosis not present

## 2021-05-27 DIAGNOSIS — R29898 Other symptoms and signs involving the musculoskeletal system: Secondary | ICD-10-CM | POA: Diagnosis not present

## 2021-05-27 DIAGNOSIS — M545 Low back pain, unspecified: Secondary | ICD-10-CM | POA: Diagnosis not present

## 2021-06-01 DIAGNOSIS — M545 Low back pain, unspecified: Secondary | ICD-10-CM | POA: Diagnosis not present

## 2021-06-03 DIAGNOSIS — M545 Low back pain, unspecified: Secondary | ICD-10-CM | POA: Diagnosis not present

## 2021-06-08 DIAGNOSIS — M545 Low back pain, unspecified: Secondary | ICD-10-CM | POA: Diagnosis not present

## 2021-06-16 ENCOUNTER — Encounter: Payer: Self-pay | Admitting: Family Medicine

## 2021-06-30 DIAGNOSIS — M545 Low back pain, unspecified: Secondary | ICD-10-CM | POA: Diagnosis not present

## 2021-08-06 IMAGING — MR MR LUMBAR SPINE W/O CM
4 of 5 series · 24 of 48 positions shown · non-contrast
Comparison: Prior radiographs from 10/28/2020.

CLINICAL DATA: Initial evaluation for myelopathy, thoracolumbar
spinal cord compression, surgical planning. Patient with history of
bilateral leg numbness and tingling for 1 year.

EXAM:
MRI THORACIC AND LUMBAR SPINE WITHOUT CONTRAST
TECHNIQUE: Multiplanar and multiecho pulse sequences of the thoracic and lumbar
spine were obtained without intravenous contrast.

[Series 2: T2 · sagittal · 4.0mm · 0.81mm/px · 6 of 15 slices shown (1 of 2)]
[im 1/15]
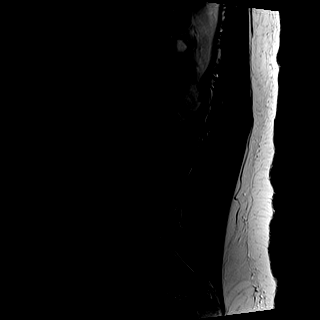
[im 3/15]
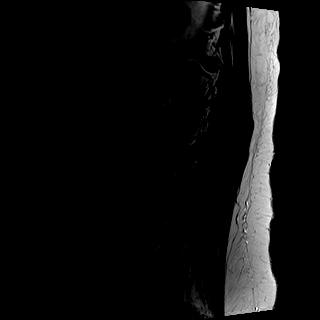
[im 6/15]
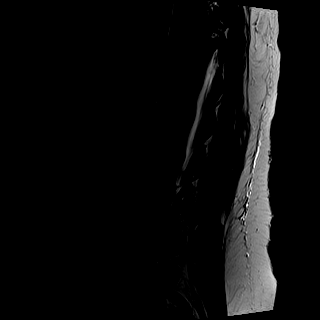
[im 9/15]
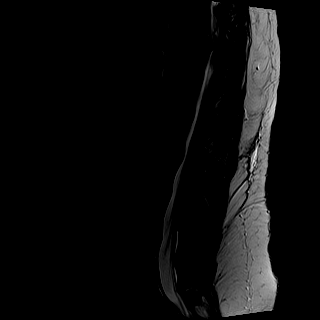
[im 12/15]
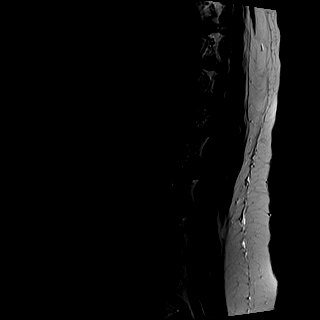
[im 15/15]
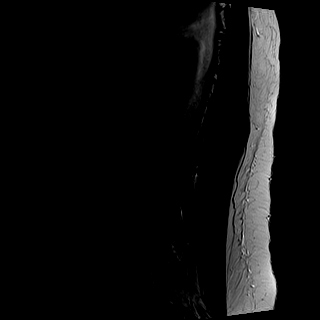

[Series 3: T1 · sagittal · 4.0mm · 0.41mm/px · 5 of 15 slices shown (1 of 2)]
[im 1/15]
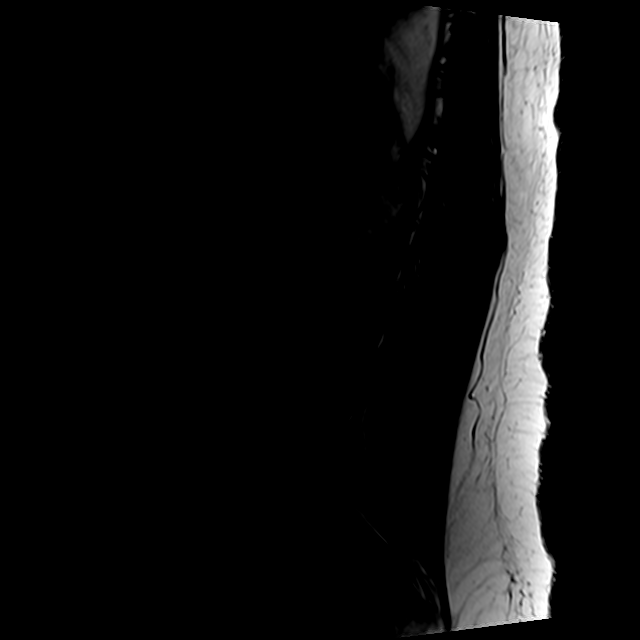
[im 4/15]
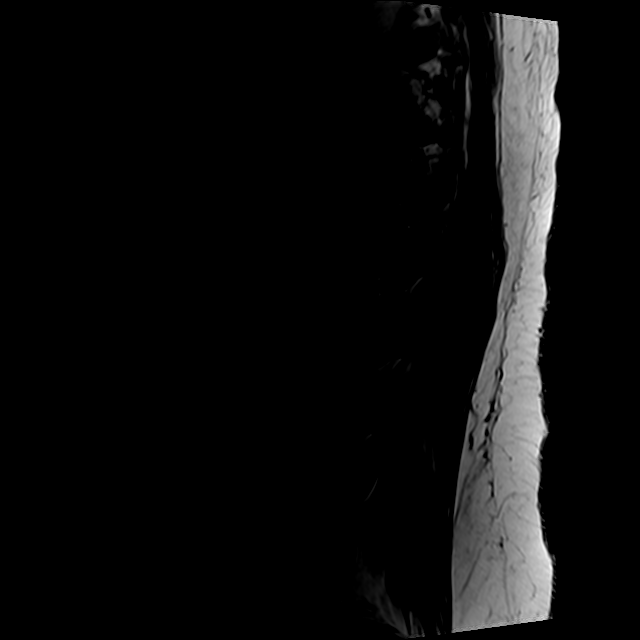
[im 8/15]
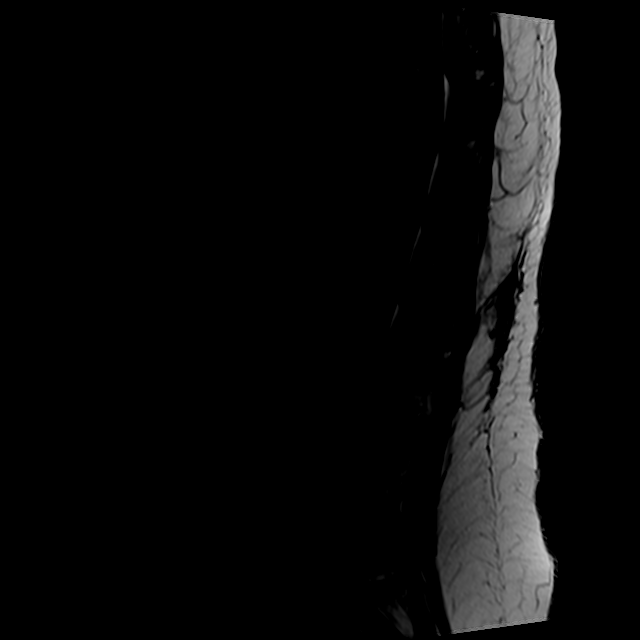
[im 11/15]
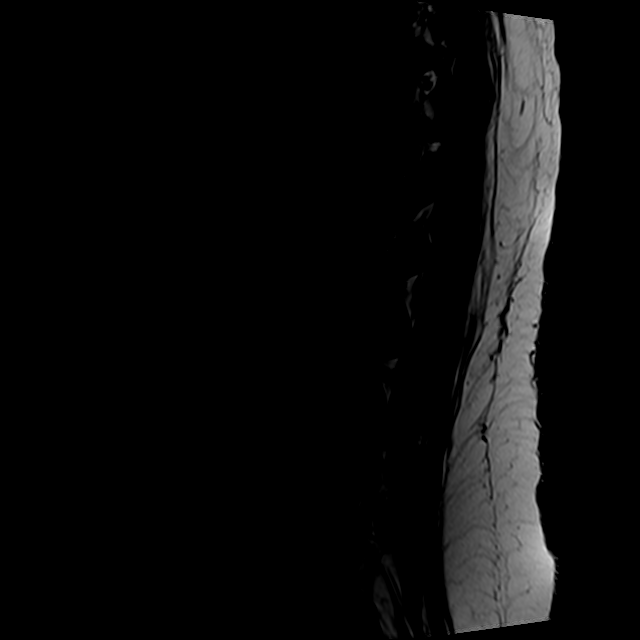
[im 15/15]
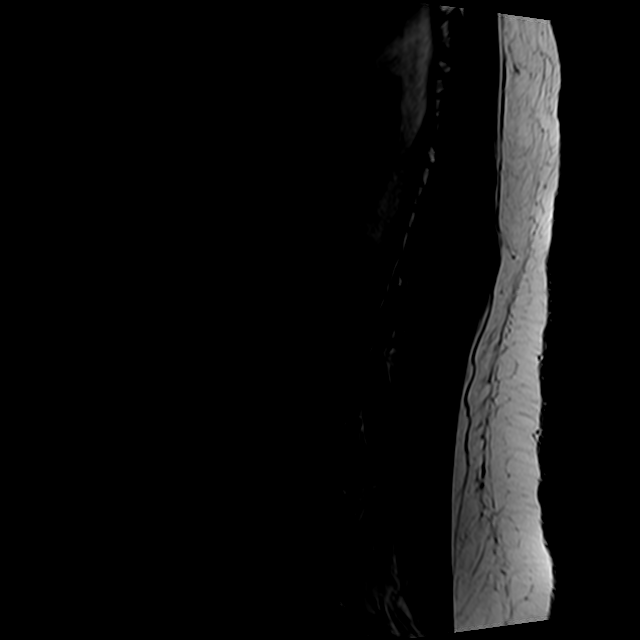

[Series 5: T2 · axial · 4.0mm · 0.78mm/px · z∈[-455,-237]mm · 10 of 45 slices shown (2 of 2)]
[im 3/45]
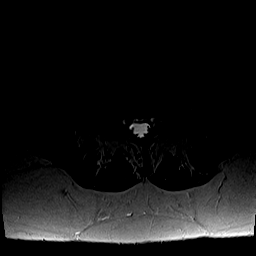
[im 6/45]
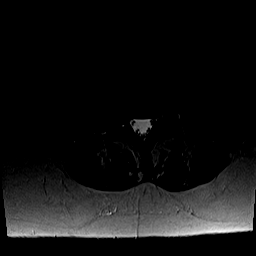
[im 9/45]
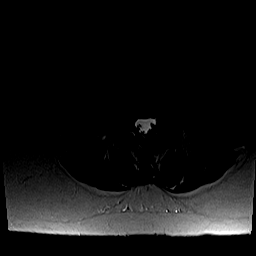
[im 15/45]
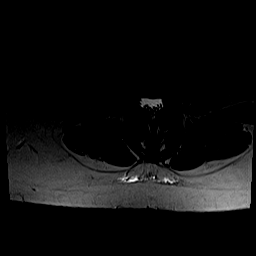
[im 21/45]
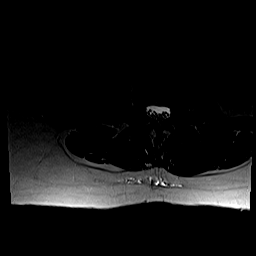
[im 24/45]
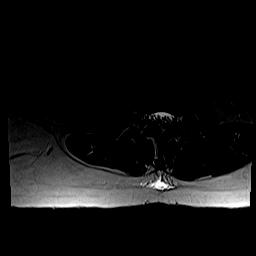
[im 27/45]
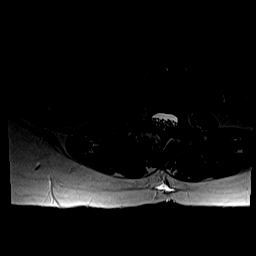
[im 33/45]
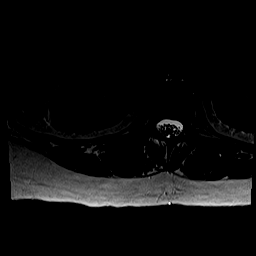
[im 39/45]
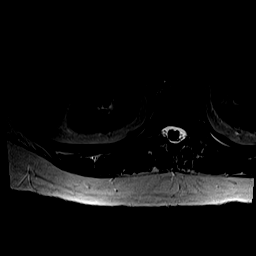
[im 45/45]
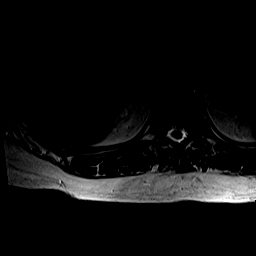

[Series 6: T1 · axial · 4.0mm · 0.39mm/px · z∈[-440,-267]mm · 3 of 45 slices shown (2 of 2)]
[im 6/45]
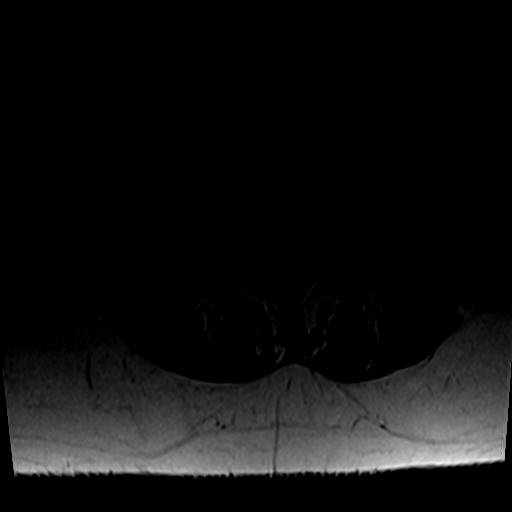
[im 24/45]
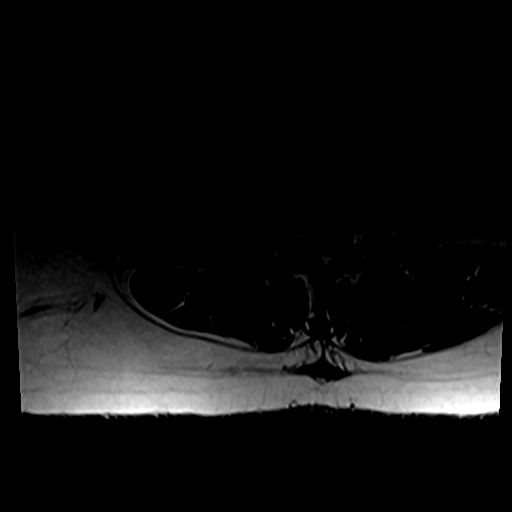
[im 39/45]
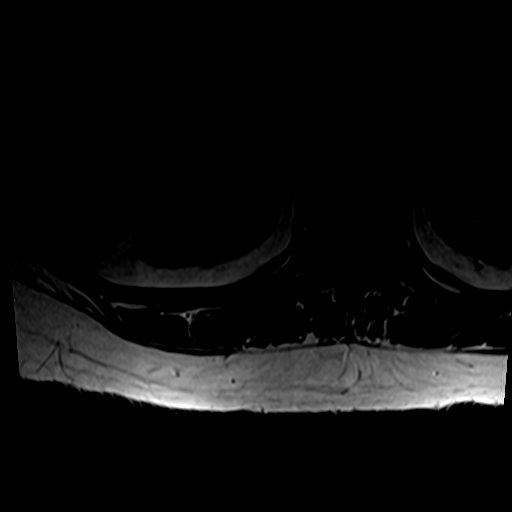

[24 of 48 positions shown; findings below may reference images not displayed]

FINDINGS: MRI THORACIC SPINE FINDINGS

Alignment: Vertebral bodies are normally aligned with preservation
of the normal thoracic kyphosis. No listhesis or static subluxation.

Vertebrae: Vertebral body height maintained without acute or chronic
fracture. Bone marrow signal intensity within normal limits. Few
scattered benign hemangiomata noted. No worrisome osseous lesions.
Discogenic reactive endplate change present about the T11-12
interspace. No other abnormal marrow edema.

Cord: Patchy T2/STIR signal abnormality seen within the distal
thoracic spinal cord at the level of T11-12, most likely reflecting
chronic compressive myelomalacia (series 7, image 7). Signal
intensity within the thoracic spinal cord is otherwise within normal
limits.

Paraspinal and other soft tissues: Paraspinous soft tissues within
normal limits. 12 mm simple cyst present at the upper pole the left
kidney. 2.3 cm nodule seen arising from the left adrenal gland
(series 8, image 33), indeterminate.

Disc levels:

T6-7: Small central disc protrusion mildly indents the ventral
thecal sac (series 8, image 20). No significant spinal stenosis or
cord deformity. Foramina remain patent.

T11-12: Diffuse degenerative disc osteophyte complex with
intervertebral disc space narrowing and reactive endplate spurring.
Broad posterior component flattens and effaces the ventral thecal
sac, eccentric to the left (series 8, image 35). Superimposed
bilateral facet hypertrophy. Secondary compression of the distal
thoracic spinal cord with associated cord signal changes, likely
myelomalacia. Thecal sac measures approximately 4-5 mm in AP
diameter at its most narrow point. Associated mild left neural
foraminal narrowing. Right neural foramina remains patent.

No other significant disc pathology seen within the thoracic spine.
No other stenosis or neural impingement.

MRI LUMBAR SPINE FINDINGS

Segmentation: Standard. Lowest well-formed disc space labeled the
L5-S1 level.

Alignment: Straightening of the normal lumbar lordosis. No listhesis
or static subluxation.

Vertebrae: Vertebral body height well maintained without acute or
chronic fracture. Bone marrow signal intensity within normal limits.
No discrete or worrisome osseous lesions. No abnormal marrow edema.

Conus medullaris and cauda equina: Conus extends to the L1 level.
Patchy cord signal abnormality at the level of T11-12 again noted
related to compressive myelopathy. Conus medullaris and nerve roots
of the cauda equina are otherwise within normal limits.

Paraspinal and other soft tissues: Paraspinous soft tissues within
normal limits. 2.3 cm left adrenal nodule again noted,
indeterminate. Few small simple cyst noted within the left kidney.
Visualized visceral structures otherwise unremarkable.

Disc levels:

L1-2:  Unremarkable.

L2-3: Disc desiccation with minimal annular disc bulge. No spinal
stenosis. Foramina remain patent.

L3-4: Minimal annular disc bulge. No spinal stenosis. Foramina
remain patent. Subcentimeter perineural cyst noted at the left
neural foramen.

L4-5: Disc desiccation with mild annular disc bulge. Associated
right foraminal to extraforaminal annular fissure without frank disc
herniation (series 5, image 35). No significant spinal stenosis.
Mild bilateral L4 foraminal narrowing.

L5-S1: Disc desiccation without significant disc bulge. No spinal
stenosis. Foramina remain patent.
IMPRESSION: MR THORACIC SPINE IMPRESSION:

1. Left eccentric disc osteophyte complex at T11-12 with resultant
severe spinal stenosis and cord compression. Associated cord signal
abnormality most consistent with compressive myelopathy.
2. Tiny central disc protrusion at T6-7 without stenosis or
impingement.
3. No other significant disc pathology or stenosis within the
thoracic spine.
4. 2.3 cm left adrenal nodule, indeterminate. Further evaluation
with dedicated adrenal mass protocol CT and/or MRI recommended for
further evaluation.

MR LUMBAR SPINE IMPRESSION:

1. Mild disc bulge with right foraminal annular fissure at L4-5.
Associated mild bilateral L4 foraminal stenosis at this level.
2. Otherwise essentially normal MRI of the lumbar spine for age. No
other significant stenosis or neural impingement.

## 2021-08-06 IMAGING — MR MR THORACIC SPINE W/O CM
5 series · 36 of 48 positions shown · non-contrast
Comparison: Prior radiographs from 10/28/2020.

CLINICAL DATA: Initial evaluation for myelopathy, thoracolumbar
spinal cord compression, surgical planning. Patient with history of
bilateral leg numbness and tingling for 1 year.

EXAM:
MRI THORACIC AND LUMBAR SPINE WITHOUT CONTRAST
TECHNIQUE: Multiplanar and multiecho pulse sequences of the thoracic and lumbar
spine were obtained without intravenous contrast.

[Series 5: T2 · sagittal · 3.0mm · 1.00mm/px · 6 of 15 slices shown (1 of 2)]
[im 1/15]
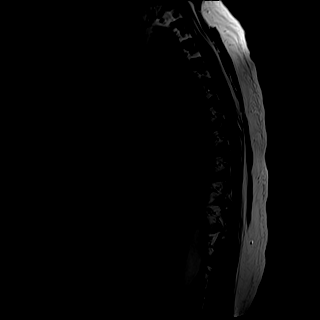
[im 3/15]
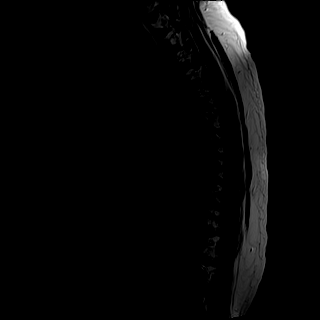
[im 6/15]
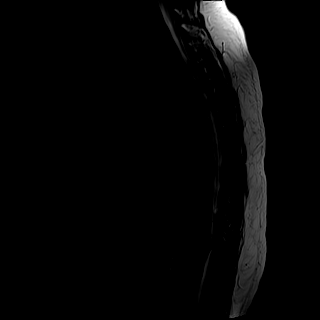
[im 9/15]
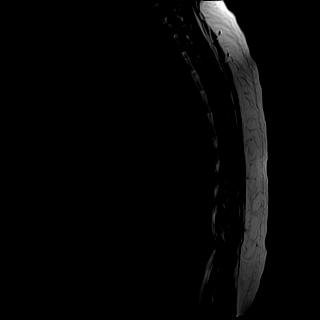
[im 12/15]
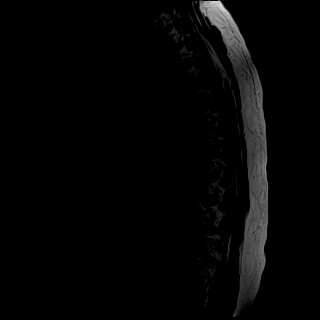
[im 15/15]
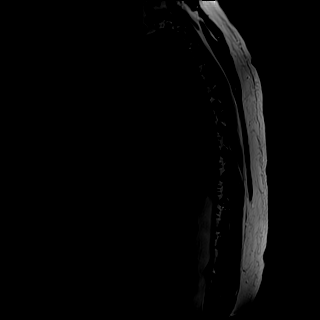

[Series 6: T1 · sagittal · 3.0mm · 1.00mm/px · 6 of 15 slices shown]
[im 1/15]
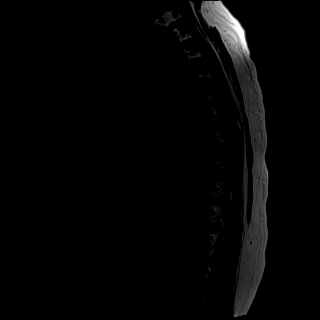
[im 3/15]
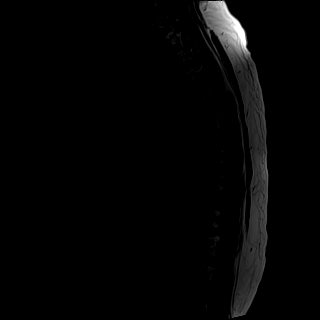
[im 6/15]
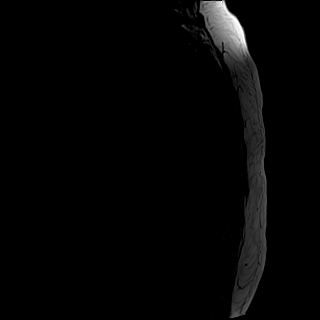
[im 9/15]
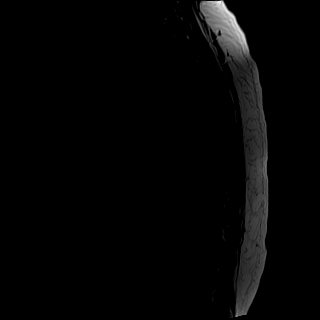
[im 12/15]
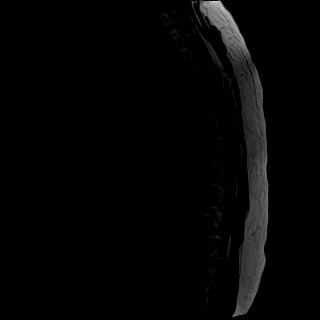
[im 15/15]
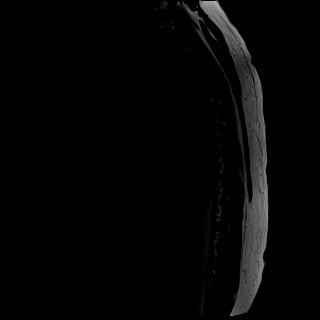

[Series 7: STIR · sagittal · 3.0mm · 1.00mm/px · 6 of 15 slices shown]
[im 1/15]
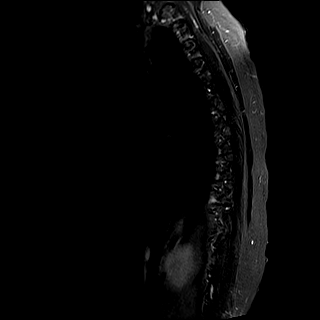
[im 3/15]
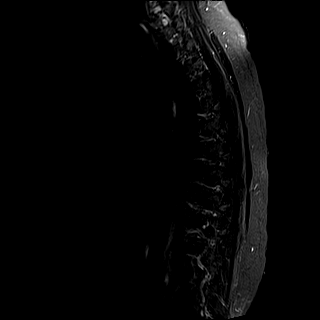
[im 6/15]
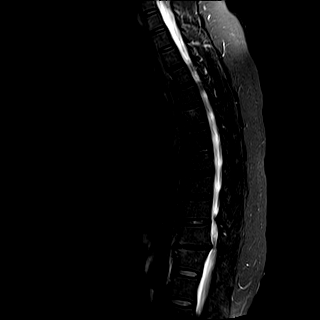
[im 9/15]
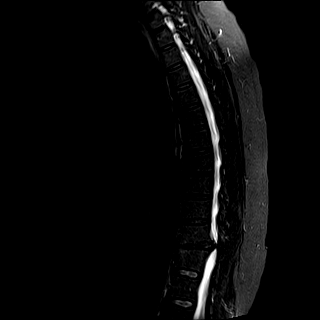
[im 12/15]
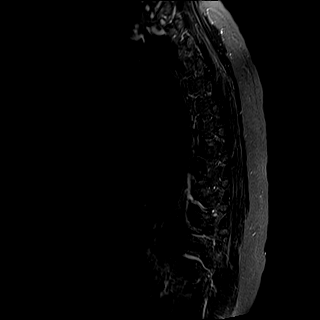
[im 15/15]
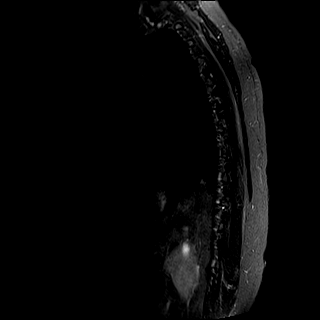

[Series 8: T2 · axial · 5.0mm · 0.86mm/px · z∈[-254,-32]mm · 10 of 39 slices shown (2 of 2)]
[im 1/39]
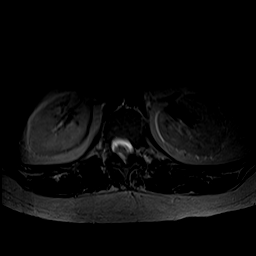
[im 3/39]
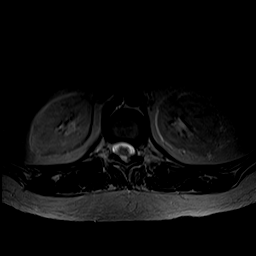
[im 6/39]
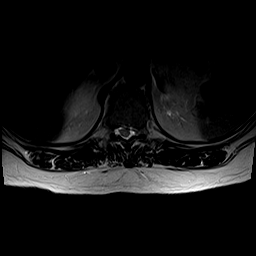
[im 11/39]
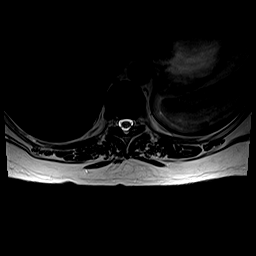
[im 17/39]
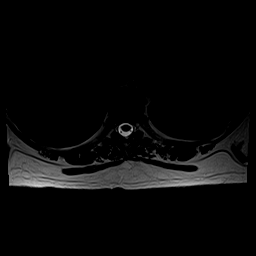
[im 20/39]
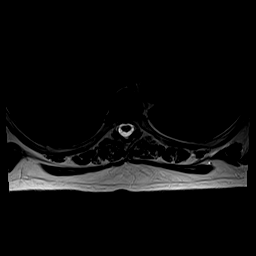
[im 22/39]
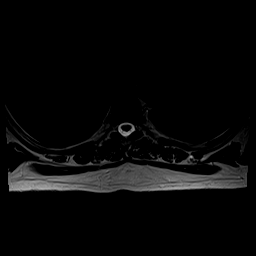
[im 28/39]
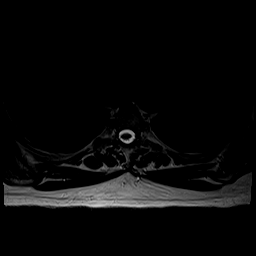
[im 33/39]
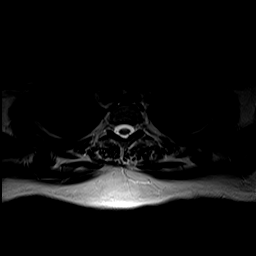
[im 39/39]
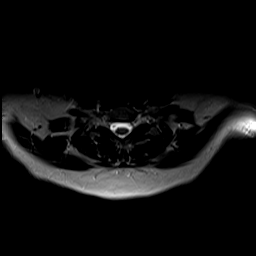

[Series 9: mpgr ax · axial · 5.0mm · 0.35mm/px · z∈[-263,-21]mm · 8 of 39 slices shown]
[im 1/39]
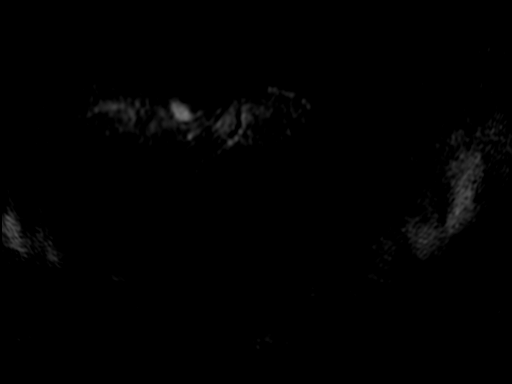
[im 6/39]
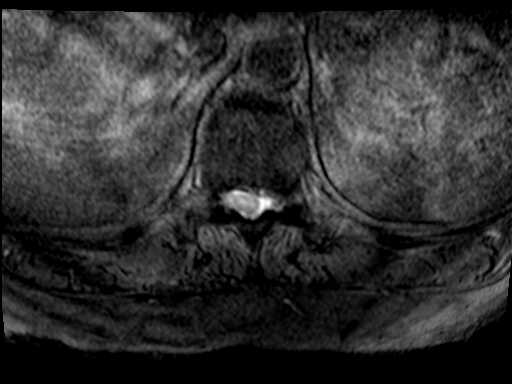
[im 11/39]
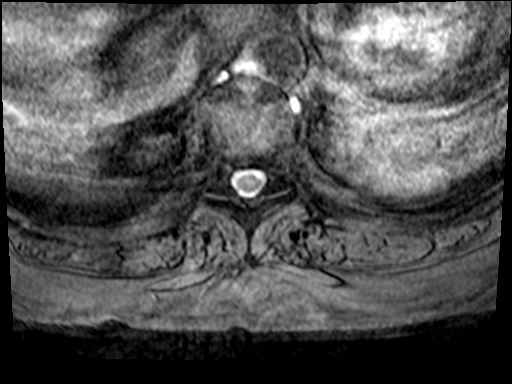
[im 17/39]
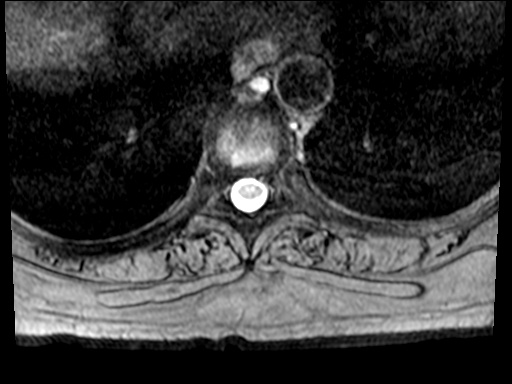
[im 22/39]
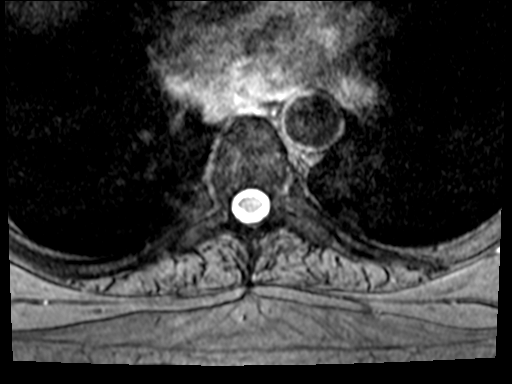
[im 28/39]
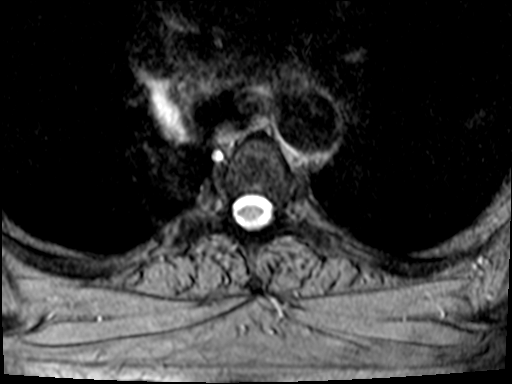
[im 33/39]
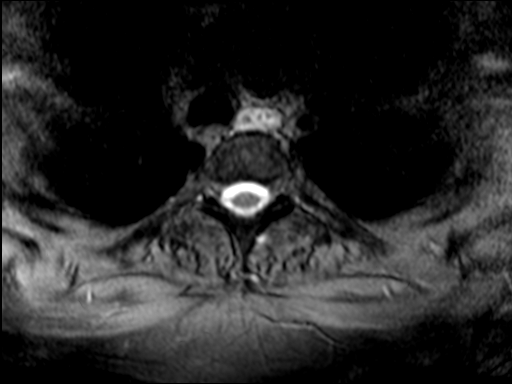
[im 39/39]
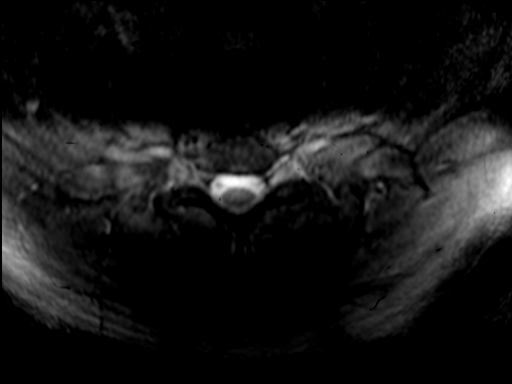

[36 of 48 positions shown; findings below may reference images not displayed]

FINDINGS: MRI THORACIC SPINE FINDINGS

Alignment: Vertebral bodies are normally aligned with preservation
of the normal thoracic kyphosis. No listhesis or static subluxation.

Vertebrae: Vertebral body height maintained without acute or chronic
fracture. Bone marrow signal intensity within normal limits. Few
scattered benign hemangiomata noted. No worrisome osseous lesions.
Discogenic reactive endplate change present about the T11-12
interspace. No other abnormal marrow edema.

Cord: Patchy T2/STIR signal abnormality seen within the distal
thoracic spinal cord at the level of T11-12, most likely reflecting
chronic compressive myelomalacia (series 7, image 7). Signal
intensity within the thoracic spinal cord is otherwise within normal
limits.

Paraspinal and other soft tissues: Paraspinous soft tissues within
normal limits. 12 mm simple cyst present at the upper pole the left
kidney. 2.3 cm nodule seen arising from the left adrenal gland
(series 8, image 33), indeterminate.

Disc levels:

T6-7: Small central disc protrusion mildly indents the ventral
thecal sac (series 8, image 20). No significant spinal stenosis or
cord deformity. Foramina remain patent.

T11-12: Diffuse degenerative disc osteophyte complex with
intervertebral disc space narrowing and reactive endplate spurring.
Broad posterior component flattens and effaces the ventral thecal
sac, eccentric to the left (series 8, image 35). Superimposed
bilateral facet hypertrophy. Secondary compression of the distal
thoracic spinal cord with associated cord signal changes, likely
myelomalacia. Thecal sac measures approximately 4-5 mm in AP
diameter at its most narrow point. Associated mild left neural
foraminal narrowing. Right neural foramina remains patent.

No other significant disc pathology seen within the thoracic spine.
No other stenosis or neural impingement.

MRI LUMBAR SPINE FINDINGS

Segmentation: Standard. Lowest well-formed disc space labeled the
L5-S1 level.

Alignment: Straightening of the normal lumbar lordosis. No listhesis
or static subluxation.

Vertebrae: Vertebral body height well maintained without acute or
chronic fracture. Bone marrow signal intensity within normal limits.
No discrete or worrisome osseous lesions. No abnormal marrow edema.

Conus medullaris and cauda equina: Conus extends to the L1 level.
Patchy cord signal abnormality at the level of T11-12 again noted
related to compressive myelopathy. Conus medullaris and nerve roots
of the cauda equina are otherwise within normal limits.

Paraspinal and other soft tissues: Paraspinous soft tissues within
normal limits. 2.3 cm left adrenal nodule again noted,
indeterminate. Few small simple cyst noted within the left kidney.
Visualized visceral structures otherwise unremarkable.

Disc levels:

L1-2:  Unremarkable.

L2-3: Disc desiccation with minimal annular disc bulge. No spinal
stenosis. Foramina remain patent.

L3-4: Minimal annular disc bulge. No spinal stenosis. Foramina
remain patent. Subcentimeter perineural cyst noted at the left
neural foramen.

L4-5: Disc desiccation with mild annular disc bulge. Associated
right foraminal to extraforaminal annular fissure without frank disc
herniation (series 5, image 35). No significant spinal stenosis.
Mild bilateral L4 foraminal narrowing.

L5-S1: Disc desiccation without significant disc bulge. No spinal
stenosis. Foramina remain patent.
IMPRESSION: MR THORACIC SPINE IMPRESSION:

1. Left eccentric disc osteophyte complex at T11-12 with resultant
severe spinal stenosis and cord compression. Associated cord signal
abnormality most consistent with compressive myelopathy.
2. Tiny central disc protrusion at T6-7 without stenosis or
impingement.
3. No other significant disc pathology or stenosis within the
thoracic spine.
4. 2.3 cm left adrenal nodule, indeterminate. Further evaluation
with dedicated adrenal mass protocol CT and/or MRI recommended for
further evaluation.

MR LUMBAR SPINE IMPRESSION:

1. Mild disc bulge with right foraminal annular fissure at L4-5.
Associated mild bilateral L4 foraminal stenosis at this level.
2. Otherwise essentially normal MRI of the lumbar spine for age. No
other significant stenosis or neural impingement.

## 2021-08-16 DIAGNOSIS — M810 Age-related osteoporosis without current pathological fracture: Secondary | ICD-10-CM | POA: Diagnosis not present

## 2021-08-16 DIAGNOSIS — R319 Hematuria, unspecified: Secondary | ICD-10-CM | POA: Diagnosis not present

## 2021-08-16 DIAGNOSIS — Z6832 Body mass index (BMI) 32.0-32.9, adult: Secondary | ICD-10-CM | POA: Diagnosis not present

## 2021-08-16 DIAGNOSIS — N9089 Other specified noninflammatory disorders of vulva and perineum: Secondary | ICD-10-CM | POA: Diagnosis not present

## 2021-08-16 DIAGNOSIS — Z1231 Encounter for screening mammogram for malignant neoplasm of breast: Secondary | ICD-10-CM | POA: Diagnosis not present

## 2021-08-16 DIAGNOSIS — Z01419 Encounter for gynecological examination (general) (routine) without abnormal findings: Secondary | ICD-10-CM | POA: Diagnosis not present

## 2021-08-27 ENCOUNTER — Encounter: Payer: Self-pay | Admitting: Family Medicine

## 2021-08-31 ENCOUNTER — Ambulatory Visit (INDEPENDENT_AMBULATORY_CARE_PROVIDER_SITE_OTHER): Payer: BC Managed Care – PPO | Admitting: Family Medicine

## 2021-08-31 ENCOUNTER — Telehealth: Payer: Self-pay | Admitting: Family Medicine

## 2021-08-31 ENCOUNTER — Other Ambulatory Visit: Payer: Self-pay

## 2021-08-31 ENCOUNTER — Encounter: Payer: Self-pay | Admitting: Family Medicine

## 2021-08-31 VITALS — BP 138/80 | HR 77 | Ht 64.0 in | Wt 188.0 lb

## 2021-08-31 DIAGNOSIS — E559 Vitamin D deficiency, unspecified: Secondary | ICD-10-CM | POA: Diagnosis not present

## 2021-08-31 DIAGNOSIS — M8580 Other specified disorders of bone density and structure, unspecified site: Secondary | ICD-10-CM

## 2021-08-31 DIAGNOSIS — L819 Disorder of pigmentation, unspecified: Secondary | ICD-10-CM | POA: Diagnosis not present

## 2021-08-31 DIAGNOSIS — R233 Spontaneous ecchymoses: Secondary | ICD-10-CM | POA: Diagnosis not present

## 2021-08-31 DIAGNOSIS — G4762 Sleep related leg cramps: Secondary | ICD-10-CM

## 2021-08-31 MED ORDER — MAGNESIUM OXIDE 400 MG PO TABS: 800.0000 mg | ORAL_TABLET | Freq: Every day | ORAL | 0 refills | Status: DC

## 2021-08-31 NOTE — Assessment & Plan Note (Signed)
Simply started on vitamin D supplementation they will be monitoring her levels to make sure that it is adequate.

## 2021-08-31 NOTE — Telephone Encounter (Signed)
Please call Dr. Jeneen Rinks Tomlin's office for last DEXA scan.

## 2021-08-31 NOTE — Assessment & Plan Note (Signed)
Will call Dr. Jeneen Rinks Tom[place]'s office for last copy of DEXA scan.  He recently tested her for vitamin D deficiency and is treating her for this.  He is planning on repeating her scan later this year.

## 2021-08-31 NOTE — Assessment & Plan Note (Signed)
We will go ahead and refill magnesium she uses it as needed.

## 2021-08-31 NOTE — Progress Notes (Signed)
Established Patient Office Visit  Subjective:  Patient ID: Laura Johnston, female    DOB: 1971/08/27  Age: 50 y.o. MRN: [id number]  CC:  Chief Complaint  Patient presents with   Fall    Pt reports that she fell back in early June    Bleeding/Bruising    Unexplained bruising    HPI [name] presents for easy bruising.  She noticed it mostly after her surgery in March she had back surgery.  But then in June she actually fell and hit her left hip and started to notice some pigmented spots developing on her left calf.  But she is also noticed some intermittent bruising on her lower legs and even in her right groin crease and does not member any specific injury or trauma.  Would also like a refill on her magnesium that Dr. Darene Lamer had prescribed for her previously for cramps.  She says she uses it more as needed but it seems to be very effective and usually will keep the cramps away for couple weeks if she takes it for a day or 2.  She is also due for a follow-up bone density test later this year.  She was recently started on a vitamin D supplement for vitamin D deficiency.  Past Medical History:  Diagnosis Date   Abnormal Pap smear    Allergy    mild    AMA (advanced maternal age) multigravida 35+    Arthritis    knees, back    GERD (gastroesophageal reflux disease)    Gestational diabetes    no meds - had gestational diabetes ONLY    Osteopenia    Prediabetes 01/23/2018   Vaginal delivery 08/19/2011    Past Surgical History:  Procedure Laterality Date   APPENDECTOMY     COLONOSCOPY  09/04/2020   KNEE SURGERY     LEEP     TUMOR REMOVAL     from buttocks    Family History  Problem Relation Age of Onset   Stroke Father    Hypertension Father    Hyperlipidemia Father    Heart disease Father    Diabetes Maternal Uncle    Stroke Paternal Grandmother    Colon polyps Mother    Colon cancer Neg Hx    Esophageal cancer Neg Hx    Rectal cancer Neg Hx     Stomach cancer Neg Hx     Social History   Socioeconomic History   Marital status: Married    Spouse name: Not on file   Number of children: Not on file   Years of education: Not on file   Highest education level: Not on file  Occupational History   Not on file  Tobacco Use   Smoking status: Former    Years: 2.00    Types: Cigarettes    Quit date: 01/23/2016    Years since quitting: 5.6   Smokeless tobacco: Never   Tobacco comments:    1-3 cig/day  Vaping Use   Vaping Use: Never used  Substance and Sexual Activity   Alcohol use: No   Drug use: No   Sexual activity: Never  Other Topics Concern   Not on file  Social History Narrative   Not on file   Social Determinants of Health   Financial Resource Strain: Not on file  Food Insecurity: Not on file  Transportation Needs: Not on file  Physical Activity: Not on file  Stress: Not on file  Social Connections: Not on file  Intimate Partner Violence: Not on file    Outpatient Medications Prior to Visit  Medication Sig Dispense Refill   betamethasone valerate (VALISONE) 0.1 % cream SMARTSIG:Sparingly Topical Twice Daily PRN  1   Multiple Vitamins-Minerals (MULTIVITAMIN WOMEN 50+ PO) Take by mouth.     Vitamin D, Ergocalciferol, (DRISDOL) 1.25 MG (50000 UNIT) CAPS capsule Take 50,000 Units by mouth once a week.     ergocalciferol (VITAMIN D2) 1.25 MG (50000 UT) capsule ergocalciferol (vitamin D2) 1,250 mcg (50,000 unit) capsule  Take 1 capsule every week by oral route.  Once completed, get OTC Vitamin D 4000 IU.  Take one daily x 16 weeks.  Once this has been completed, schedule an appt. to repeat labs     magnesium 30 MG tablet Take 30 mg by mouth daily.     albuterol (PROVENTIL) (2.5 MG/3ML) 0.083% nebulizer solution Take 3 mLs (2.5 mg total) by nebulization every 6 (six) hours as needed for wheezing or shortness of breath. 75 mL 12   albuterol (VENTOLIN HFA) 108 (90 Base) MCG/ACT inhaler Inhale 1-2 puffs into the lungs  every 6 (six) hours as needed for wheezing or shortness of breath. 18 g 2   benzonatate (TESSALON) 200 MG capsule Take 1 capsule (200 mg total) by mouth 3 (three) times daily as needed for cough. 45 capsule 0   celecoxib (CELEBREX) 200 MG capsule One to 2 tablets by mouth daily as needed for pain.     cyclobenzaprine (FLEXERIL) 10 MG tablet Take by mouth.     dexamethasone (DECADRON) 4 MG tablet Take 1 tablet (4 mg total) by mouth 2 (two) times daily with a meal. 10 tablet 0   gabapentin (NEURONTIN) 300 MG capsule One tab PO qHS for a week, then BID for a week, then TID. May double weekly to a max of 3,600mg /day 90 capsule 3   HYDROcodone bit-homatropine (HYCODAN) 5-1.5 MG/5ML syrup Take 5 mLs by mouth at bedtime as needed for cough. 120 mL 0   ibuprofen (ADVIL) 600 MG tablet Take 1 tablet (600 mg total) by mouth every 8 (eight) hours as needed. 30 tablet 0   ipratropium (ATROVENT) 0.03 % nasal spray Place into the nose.     magnesium oxide (MAG-OX) 400 MG tablet Take by mouth.     metoprolol succinate (TOPROL-XL) 25 MG 24 hr tablet Take by mouth.     No facility-administered medications prior to visit.    Allergies  Allergen Reactions   Cortisone    Hydrocortisone    Meloxicam    Meloxicam     Paralysis   Pseudoephedrine     REACTION: numbness to fingers/tongue    ROS Review of Systems    Objective:    Physical Exam Constitutional:      Appearance: Normal appearance. She is well-developed.  HENT:     Head: Normocephalic and atraumatic.  Cardiovascular:     Rate and Rhythm: Normal rate and regular rhythm.     Heart sounds: Normal heart sounds.  Pulmonary:     Effort: Pulmonary effort is normal.     Breath sounds: Normal breath sounds.  Skin:    General: Skin is warm and dry.  Neurological:     Mental Status: She is alert and oriented to person, place, and time.  Psychiatric:        Behavior: Behavior normal.    BP 138/80   Pulse 77   Ht 5\' 4"  (1.626 m)   Wt 188  lb (85.3 kg)  LMP 10/25/2011   SpO2 100%   BMI 32.27 kg/m  Wt Readings from Last 3 Encounters:  08/31/21 188 lb (85.3 kg)  09/04/20 185 lb (83.9 kg)  08/20/20 185 lb (83.9 kg)     Health Maintenance Due  Topic Date Due   Zoster Vaccines- Shingrix (1 of 2) Never done   MAMMOGRAM  06/04/2021   COVID-19 Vaccine (3 - Pfizer risk series) 06/25/2021   TETANUS/TDAP  08/19/2021    There are no preventive care reminders to display for this patient.  Lab Results  Component Value Date   TSH 0.81 05/17/2019   Lab Results  Component Value Date   WBC 5.9 06/25/2019   HGB 13.3 06/25/2019   HCT 40.2 06/25/2019   MCV 89.7 06/25/2019   PLT 185 06/25/2019   Lab Results  Component Value Date   NA 136 06/25/2019   K 3.7 06/25/2019   CO2 26 06/25/2019   GLUCOSE 115 (H) 06/25/2019   BUN 16 06/25/2019   CREATININE 0.62 06/25/2019   BILITOT 0.4 06/25/2019   ALKPHOS 54 05/10/2017   AST 41 (H) 06/25/2019   ALT 62 (H) 06/25/2019   PROT 7.3 06/25/2019   ALBUMIN 4.2 05/10/2017   CALCIUM 9.5 06/25/2019   Lab Results  Component Value Date   CHOL 221 (A) 03/31/2015   Lab Results  Component Value Date   HDL 47 03/31/2015   Lab Results  Component Value Date   LDLCALC 129 03/31/2015   Lab Results  Component Value Date   TRIG 225 (A) 03/31/2015   Lab Results  Component Value Date   CHOLHDL 4.4 Ratio 12/22/2009   Lab Results  Component Value Date   HGBA1C 5.7 (H) 01/22/2018      Assessment & Plan:   Problem List Items Addressed This Visit       Musculoskeletal and Integument   Osteopenia    Will call Dr. Jeneen Rinks [place]'s office for last copy of DEXA scan.  He recently tested her for vitamin D deficiency and is treating her for this.  He is planning on repeating her scan later this year.        Other   Vitamin D deficiency    Simply started on vitamin D supplementation they will be monitoring her levels to make sure that it is adequate.      Cramp in lower  extremity associated with sleep    We will go ahead and refill magnesium she uses it as needed.      Relevant Medications   magnesium oxide (MAG-OX) 400 MG tablet   Other Visit Diagnoses     Easy bruising    -  Primary   Relevant Orders   COMPLETE METABOLIC PANEL WITH GFR   INR/PT   CBC with Differential/Platelet   Hyperpigmentation           Easy bruising-we will check PT/INR, liver enzymes, platelets.  We will call with results once available she is never had major issues with this before she is not on any new or changed medications.  In fact she is really hardly using any medication at all she has not really been using NSAIDs regularly occasionally she says she might take an ibuprofen but not on a daily basis.  Hyperpigmentation-the lesions on her left posterior leg are more consistent with hyperpigmentation and look different from the bruises she says those were never looked like bruises.  They are completely macular.  No induration or swelling or not underneath the skin  unclear what may have caused this.  Meds ordered this encounter  Medications   magnesium oxide (MAG-OX) 400 MG tablet    Sig: Take 2 tablets (800 mg total) by mouth at bedtime.    Dispense:  180 tablet    Refill:  0     Follow-up: No follow-ups on file.    Beatrice Lecher, MD

## 2021-09-01 LAB — PROTIME-INR
INR: 1
Prothrombin Time: 10.3 s (ref 9.0–11.5)

## 2021-09-01 LAB — COMPLETE METABOLIC PANEL WITH GFR
AG Ratio: 1.5 (calc) (ref 1.0–2.5)
ALT: 32 U/L — ABNORMAL HIGH (ref 6–29)
AST: 27 U/L (ref 10–35)
Albumin: 4.4 g/dL (ref 3.6–5.1)
Alkaline phosphatase (APISO): 62 U/L (ref 37–153)
BUN: 12 mg/dL (ref 7–25)
CO2: 26 mmol/L (ref 20–32)
Calcium: 9.5 mg/dL (ref 8.6–10.4)
Chloride: 102 mmol/L (ref 98–110)
Creat: 0.53 mg/dL (ref 0.50–1.03)
Globulin: 3 g/dL (calc) (ref 1.9–3.7)
Glucose, Bld: 89 mg/dL (ref 65–99)
Potassium: 4.3 mmol/L (ref 3.5–5.3)
Sodium: 139 mmol/L (ref 135–146)
Total Bilirubin: 0.6 mg/dL (ref 0.2–1.2)
Total Protein: 7.4 g/dL (ref 6.1–8.1)
eGFR: 112 mL/min/{1.73_m2} (ref >=60)

## 2021-09-01 LAB — CBC WITH DIFFERENTIAL/PLATELET
Absolute Monocytes: 381 cells/uL (ref 200–950)
Basophils Absolute: 61 cells/uL (ref 0–200)
Basophils Relative: 1.3 %
Eosinophils Absolute: 202 cells/uL (ref 15–500)
Eosinophils Relative: 4.3 %
HCT: 40.7 % (ref 35.0–45.0)
Hemoglobin: 13.6 g/dL (ref 11.7–15.5)
Lymphs Abs: 2181 cells/uL (ref 850–3900)
MCH: 29.7 pg (ref 27.0–33.0)
MCHC: 33.4 g/dL (ref 32.0–36.0)
MCV: 88.9 fL (ref 80.0–100.0)
MPV: 11.4 fL (ref 7.5–12.5)
Monocytes Relative: 8.1 %
Neutro Abs: 1875 cells/uL (ref 1500–7800)
Neutrophils Relative %: 39.9 %
Platelets: 173 10*3/uL (ref 140–400)
RBC: 4.58 10*6/uL (ref 3.80–5.10)
RDW: 12.2 % (ref 11.0–15.0)
Total Lymphocyte: 46.4 %
WBC: 4.7 10*3/uL (ref 3.8–10.8)

## 2021-09-01 NOTE — Telephone Encounter (Signed)
Received DEXA scan and mammogram reports from Dr. Clementeen Hoof office.  Both placed in Dr. Gardiner Ramus basket.  Charyl Bigger, CMA

## 2021-09-01 NOTE — Progress Notes (Signed)
Liver function actually looks better this time.  In fact the AST is back to normal which is wonderful and the ALT has come down significantly.  Great work!  The PT/INR is normal so the blood is not excessively thin which is great.  Your platelets are also normal so that is not causing the excess bruising.  So far I do not spacey a specific reason for why this is happening so I definitely want a keep an eye on it.  Just remember things like ibuprofen and Aleve and aspirin can also thin the blood so can other products such as fish oil and ginseng s if you are taking those, you may want to stop them.  If the bruising gets worse or you start noticing bleeding then please let me know.  In regards to the hyperpigmentation on your left calf, if you start getting any new lesions please let me know.

## 2021-09-01 NOTE — Telephone Encounter (Signed)
Spoke with Junie Panning at Dr. Clementeen Hoof office who fax over [facility] and mammogram.  Charyl Bigger, CMA

## 2021-09-29 DIAGNOSIS — H5203 Hypermetropia, bilateral: Secondary | ICD-10-CM | POA: Diagnosis not present

## 2021-10-05 DIAGNOSIS — M47814 Spondylosis without myelopathy or radiculopathy, thoracic region: Secondary | ICD-10-CM | POA: Diagnosis not present

## 2021-10-05 DIAGNOSIS — M4804 Spinal stenosis, thoracic region: Secondary | ICD-10-CM | POA: Diagnosis not present

## 2021-10-05 DIAGNOSIS — M5104 Intervertebral disc disorders with myelopathy, thoracic region: Secondary | ICD-10-CM | POA: Diagnosis not present

## 2021-12-01 ENCOUNTER — Other Ambulatory Visit: Payer: Self-pay | Admitting: Family Medicine

## 2021-12-01 DIAGNOSIS — G4762 Sleep related leg cramps: Secondary | ICD-10-CM

## 2022-01-11 DIAGNOSIS — M47814 Spondylosis without myelopathy or radiculopathy, thoracic region: Secondary | ICD-10-CM | POA: Diagnosis not present

## 2022-01-11 DIAGNOSIS — M4804 Spinal stenosis, thoracic region: Secondary | ICD-10-CM | POA: Diagnosis not present

## 2022-01-11 DIAGNOSIS — M5104 Intervertebral disc disorders with myelopathy, thoracic region: Secondary | ICD-10-CM | POA: Diagnosis not present

## 2022-02-16 ENCOUNTER — Other Ambulatory Visit: Payer: Self-pay

## 2022-02-16 ENCOUNTER — Ambulatory Visit: Payer: BC Managed Care – PPO | Admitting: Family Medicine

## 2022-02-16 ENCOUNTER — Encounter: Payer: Self-pay | Admitting: Family Medicine

## 2022-02-16 VITALS — BP 125/75 | HR 84 | Ht 64.0 in | Wt 183.0 lb

## 2022-02-16 DIAGNOSIS — R109 Unspecified abdominal pain: Secondary | ICD-10-CM

## 2022-02-16 DIAGNOSIS — R319 Hematuria, unspecified: Secondary | ICD-10-CM | POA: Diagnosis not present

## 2022-02-16 DIAGNOSIS — Z23 Encounter for immunization: Secondary | ICD-10-CM

## 2022-02-16 DIAGNOSIS — R1012 Left upper quadrant pain: Secondary | ICD-10-CM

## 2022-02-16 LAB — POCT URINALYSIS DIP (CLINITEK)
Bilirubin, UA: NEGATIVE
Blood, UA: NEGATIVE
Glucose, UA: NEGATIVE mg/dL
Ketones, POC UA: NEGATIVE mg/dL
Leukocytes, UA: NEGATIVE
Nitrite, UA: NEGATIVE
POC PROTEIN,UA: NEGATIVE
Spec Grav, UA: >=1.03 — AB (ref 1.010–1.025)
Urobilinogen, UA: 0.2 E.U./dL
pH, UA: 5.5 (ref 5.0–8.0)

## 2022-02-16 NOTE — Progress Notes (Signed)
? ?Acute Office Visit ? ?Subjective:  ? ? Patient ID: Laura Johnston, female    DOB: 13-Sep-1971, 51 y.o.   MRN: [id number] ? ?Chief Complaint  ?Patient presents with  ? Chest Pain  ? ? ?HPI ?Patient is in today for Left flank and left upper quad pain. Pain that started back in Janurary.  Feels like pressure.  Saw maybe some blood in the urine. Though no dysuria.  More noticeable when she lies down.  Especially if she lies flat on her back but sometimes on her side.  She says it does not usually bother appreciate any sitting up or if she is moving around.  She had had back surgery about a year ago on T11.  And about a week after surgery she had fallen and that is really when the pain initially started over that left flank.  She mentioned it to them at the time but they felt like it was not related to her back so she just ignored it and she says it is coming gone since then.  But part of the reason that she came in today is because about a week and a half ago she says the pain was persistent not only was it in the left flank but it was in the left upper quadrant.  She says it pretty much lasted all day and even into the next day.  She says that it was even there with sitting up.  It started around March 12.  She said and then when she went to eat something it felt almost like a sharp spasm in that left upper quadrant and she said she had to stand up and walk around she was very belchy and gassy that day.  She also noticed that her urine looked a little pink that day but has not noticed it since then.  No history of kidney stones. ? ?Past Medical History:  ?Diagnosis Date  ? Abnormal Pap smear   ? Allergy   ? mild   ? AMA (advanced maternal age) multigravida 35+   ? Arthritis   ? knees, back   ? GERD (gastroesophageal reflux disease)   ? Gestational diabetes   ? no meds - had gestational diabetes ONLY   ? Osteopenia   ? Prediabetes 01/23/2018  ? Vaginal delivery 08/19/2011  ? ? ?Past Surgical History:  ?Procedure  Laterality Date  ? APPENDECTOMY    ? COLONOSCOPY  09/04/2020  ? KNEE SURGERY    ? LEEP    ? TUMOR REMOVAL    ? from buttocks  ? ? ?Family History  ?Problem Relation Age of Onset  ? Stroke Father   ? Hypertension Father   ? Hyperlipidemia Father   ? Heart disease Father   ? Diabetes Maternal Uncle   ? Stroke Paternal Grandmother   ? Colon polyps Mother   ? Colon cancer Neg Hx   ? Esophageal cancer Neg Hx   ? Rectal cancer Neg Hx   ? Stomach cancer Neg Hx   ? ? ?Social History  ? ?Socioeconomic History  ? Marital status: Married  ?  Spouse name: Not on file  ? Number of children: Not on file  ? Years of education: Not on file  ? Highest education level: Not on file  ?Occupational History  ? Not on file  ?Tobacco Use  ? Smoking status: Former  ?  Years: 2.00  ?  Types: Cigarettes  ?  Quit date: 01/23/2016  ?  Years since  quitting: 6.0  ? Smokeless tobacco: Never  ? Tobacco comments:  ?  1-3 cig/day  ?Vaping Use  ? Vaping Use: Never used  ?Substance and Sexual Activity  ? Alcohol use: No  ? Drug use: No  ? Sexual activity: Never  ?Other Topics Concern  ? Not on file  ?Social History Narrative  ? Not on file  ? ?Social Determinants of Health  ? ?Financial Resource Strain: Not on file  ?Food Insecurity: Not on file  ?Transportation Needs: Not on file  ?Physical Activity: Not on file  ?Stress: Not on file  ?Social Connections: Not on file  ?Intimate Partner Violence: Not on file  ? ? ?Outpatient Medications Prior to Visit  ?Medication Sig Dispense Refill  ? magnesium oxide (MAG-OX) 400 (240 Mg) MG tablet TAKE 2 TABLETS (800 MG TOTAL) BY MOUTH AT BEDTIME. 180 tablet 0  ? Multiple Vitamins-Minerals (MULTIVITAMIN WOMEN 50+ PO) Take by mouth.    ? betamethasone valerate (VALISONE) 0.1 % cream SMARTSIG:Sparingly Topical Twice Daily PRN  1  ? Vitamin D, Ergocalciferol, (DRISDOL) 1.25 MG (50000 UNIT) CAPS capsule Take 50,000 Units by mouth once a week.    ? ?No facility-administered medications prior to visit.  ? ? ?Allergies   ?Allergen Reactions  ? Tape   ?  Other reaction(s): Redness  ? Cortisone   ? Hydrocortisone   ? Meloxicam   ? Meloxicam   ?  Paralysis  ? Pseudoephedrine   ?  REACTION: numbness to fingers/tongue  ? ? ?Review of Systems ? ?   ?Objective:  ?  ?Physical Exam ? ?BP 125/75   Pulse 84   Ht '5\' 4"'  (1.626 m)   Wt 183 lb (83 kg)   LMP 10/25/2011   SpO2 100%   BMI 31.41 kg/m?  ?Wt Readings from Last 3 Encounters:  ?02/16/22 183 lb (83 kg)  ?08/31/21 188 lb (85.3 kg)  ?09/04/20 185 lb (83.9 kg)  ? ? ?There are no preventive care reminders to display for this patient. ? ?There are no preventive care reminders to display for this patient. ? ? ?Lab Results  ?Component Value Date  ? TSH 0.81 05/17/2019  ? ?Lab Results  ?Component Value Date  ? WBC 4.7 08/31/2021  ? HGB 13.6 08/31/2021  ? HCT 40.7 08/31/2021  ? MCV 88.9 08/31/2021  ? PLT 173 08/31/2021  ? ?Lab Results  ?Component Value Date  ? NA 139 08/31/2021  ? K 4.3 08/31/2021  ? CO2 26 08/31/2021  ? GLUCOSE 89 08/31/2021  ? BUN 12 08/31/2021  ? CREATININE 0.53 08/31/2021  ? BILITOT 0.6 08/31/2021  ? ALKPHOS 54 05/10/2017  ? AST 27 08/31/2021  ? ALT 32 (H) 08/31/2021  ? PROT 7.4 08/31/2021  ? ALBUMIN 4.2 05/10/2017  ? CALCIUM 9.5 08/31/2021  ? EGFR 112 08/31/2021  ? ?Lab Results  ?Component Value Date  ? CHOL 221 (A) 03/31/2015  ? ?Lab Results  ?Component Value Date  ? HDL 47 03/31/2015  ? ?Lab Results  ?Component Value Date  ? [place] 129 03/31/2015  ? ?Lab Results  ?Component Value Date  ? TRIG 225 (A) 03/31/2015  ? ?Lab Results  ?Component Value Date  ? CHOLHDL 4.4 Ratio 12/22/2009  ? ?Lab Results  ?Component Value Date  ? HGBA1C 5.7 (H) 01/22/2018  ? ? ?   ?Assessment & Plan:  ? ?Problem List Items Addressed This Visit   ?None ?Visit Diagnoses   ? ? Need for Tdap vaccination    -  Primary  ?  Relevant Orders  ? Tdap vaccine greater than or equal to 7yo IM (Completed)  ? Hematuria, unspecified type      ? Relevant Orders  ? POCT URINALYSIS DIP (CLINITEK) (Completed)  ?  LUQ pain      ? Relevant Orders  ? CBC with Differential/Platelet  ? COMPLETE METABOLIC PANEL WITH GFR  ? Lipase  ? Left flank pain      ? Relevant Orders  ? CBC with Differential/Platelet  ? COMPLETE METABOLIC PANEL WITH GFR  ? Lipase  ? ?  ? ?Left flank pain-I do think it is probably more musculoskeletal as it hurts mostly when she lays flat on her back in any other position she really does not notice the discomfort.  But I am more concerned about the left upper quadrant pain.  That is more anterior.  Not sure if it is related to the flank pain or if it is completely separate. ? ?Left upper quadrant pain-we will check for acute pancreatitis as well as elevated CBC.  We did discuss getting additional imaging including a CT.  But she wants to hold off on getting the blood work back first.  Urinalysis was normal today since she did mention some pink-colored urine that occurred the day that she was having severe pain. ? ?No orders of the defined types were placed in this encounter. ? ? ? ?Beatrice Lecher, MD ? ?

## 2022-02-17 LAB — CBC WITH DIFFERENTIAL/PLATELET
Absolute Monocytes: 350 cells/uL (ref 200–950)
Basophils Absolute: 60 cells/uL (ref 0–200)
Basophils Relative: 1.2 %
Eosinophils Absolute: 290 cells/uL (ref 15–500)
Eosinophils Relative: 5.8 %
HCT: 41.5 % (ref 35.0–45.0)
Hemoglobin: 13.7 g/dL (ref 11.7–15.5)
Lymphs Abs: 2235 cells/uL (ref 850–3900)
MCH: 30 pg (ref 27.0–33.0)
MCHC: 33 g/dL (ref 32.0–36.0)
MCV: 91 fL (ref 80.0–100.0)
MPV: 11.5 fL (ref 7.5–12.5)
Monocytes Relative: 7 %
Neutro Abs: 2065 cells/uL (ref 1500–7800)
Neutrophils Relative %: 41.3 %
Platelets: 170 10*3/uL (ref 140–400)
RBC: 4.56 10*6/uL (ref 3.80–5.10)
RDW: 12.1 % (ref 11.0–15.0)
Total Lymphocyte: 44.7 %
WBC: 5 10*3/uL (ref 3.8–10.8)

## 2022-02-17 LAB — COMPLETE METABOLIC PANEL WITH GFR
AG Ratio: 1.6 (calc) (ref 1.0–2.5)
ALT: 32 U/L — ABNORMAL HIGH (ref 6–29)
AST: 24 U/L (ref 10–35)
Albumin: 4.6 g/dL (ref 3.6–5.1)
Alkaline phosphatase (APISO): 64 U/L (ref 37–153)
BUN: 17 mg/dL (ref 7–25)
CO2: 29 mmol/L (ref 20–32)
Calcium: 9.6 mg/dL (ref 8.6–10.4)
Chloride: 104 mmol/L (ref 98–110)
Creat: 0.58 mg/dL (ref 0.50–1.03)
Globulin: 2.8 g/dL (calc) (ref 1.9–3.7)
Glucose, Bld: 100 mg/dL — ABNORMAL HIGH (ref 65–99)
Potassium: 4.7 mmol/L (ref 3.5–5.3)
Sodium: 140 mmol/L (ref 135–146)
Total Bilirubin: 0.5 mg/dL (ref 0.2–1.2)
Total Protein: 7.4 g/dL (ref 6.1–8.1)
eGFR: 109 mL/min/{1.73_m2} (ref >=60)

## 2022-02-17 LAB — LIPASE: Lipase: 57 U/L (ref 7–60)

## 2022-02-18 ENCOUNTER — Other Ambulatory Visit: Payer: Self-pay

## 2022-02-18 DIAGNOSIS — R1012 Left upper quadrant pain: Secondary | ICD-10-CM

## 2022-02-18 NOTE — Progress Notes (Signed)
Hi Joylyn, overall your metabolic panel looks good.  The ALT liver enzyme is just slightly elevated similar to last time.  Just continue to work on healthy diet and regular exercise.  Blood count is normal.  Lipase is also normal no sign of pancreatitis which is very reassuring.  No sign of systemic infection which is also good.  I like to move forward with a CT of the abdomen if you are still having pain.  I know you wanted to wait for the blood work first.  So just let me know what you think.

## 2022-02-22 ENCOUNTER — Telehealth: Payer: Self-pay

## 2022-02-22 NOTE — Telephone Encounter (Incomplete Revision)
Attempted to obtain prior auth for CT Abdomen pelvis w wo contrast. Per insurance, patient does not meet medical necessity.  ? ?Informed that provider will need to complete a peer to peer review at [phone]. Case #[id number] ? ?Prior auth pending until peer to peer review is completed.  ?

## 2022-02-22 NOTE — Telephone Encounter (Addendum)
Attempted to obtain prior auth for CT Abdomen pelvis w wo contrast. Per insurance, patient does not meet medical necessity.  ? ?Informed that provider will need to complete a peer to peer review at [phone]. Case #[id number] ? ?Prior auth pending until peer to peer review is completed.  ?

## 2022-03-09 NOTE — Telephone Encounter (Signed)
Spoke w/pt she stated that the pain comes and goes. She did get a letter that the CT scan was denied also.  ? ?She stated that should this change she will call back and let us know.  ?

## 2022-03-09 NOTE — Telephone Encounter (Signed)
Please call patient and let her know that the insurance denied the CT of her abdomen and pelvis.  But I wanted to see if she was still having pain we can try to do an appeal if she still having discomfort but they did deny the initial request ?

## 2022-03-17 ENCOUNTER — Ambulatory Visit: Payer: BC Managed Care – PPO | Admitting: Family Medicine

## 2022-04-27 DIAGNOSIS — R079 Chest pain, unspecified: Secondary | ICD-10-CM | POA: Diagnosis not present

## 2022-04-27 DIAGNOSIS — I10 Essential (primary) hypertension: Secondary | ICD-10-CM | POA: Diagnosis not present

## 2022-04-27 DIAGNOSIS — F419 Anxiety disorder, unspecified: Secondary | ICD-10-CM | POA: Diagnosis not present

## 2022-04-27 DIAGNOSIS — Z87891 Personal history of nicotine dependence: Secondary | ICD-10-CM | POA: Diagnosis not present

## 2022-04-27 DIAGNOSIS — R0789 Other chest pain: Secondary | ICD-10-CM | POA: Diagnosis not present

## 2022-04-27 DIAGNOSIS — R29818 Other symptoms and signs involving the nervous system: Secondary | ICD-10-CM | POA: Diagnosis not present

## 2022-04-27 DIAGNOSIS — R202 Paresthesia of skin: Secondary | ICD-10-CM | POA: Diagnosis not present

## 2022-04-27 DIAGNOSIS — R519 Headache, unspecified: Secondary | ICD-10-CM | POA: Diagnosis not present

## 2022-04-27 DIAGNOSIS — R200 Anesthesia of skin: Secondary | ICD-10-CM | POA: Diagnosis not present

## 2022-05-02 ENCOUNTER — Ambulatory Visit (INDEPENDENT_AMBULATORY_CARE_PROVIDER_SITE_OTHER): Payer: BC Managed Care – PPO

## 2022-05-02 ENCOUNTER — Encounter: Payer: Self-pay | Admitting: Family Medicine

## 2022-05-02 ENCOUNTER — Ambulatory Visit: Payer: BC Managed Care – PPO | Admitting: Family Medicine

## 2022-05-02 VITALS — BP 117/75 | HR 100 | Resp 16 | Ht 64.0 in | Wt 187.0 lb

## 2022-05-02 DIAGNOSIS — R002 Palpitations: Secondary | ICD-10-CM

## 2022-05-02 DIAGNOSIS — R030 Elevated blood-pressure reading, without diagnosis of hypertension: Secondary | ICD-10-CM | POA: Diagnosis not present

## 2022-05-02 DIAGNOSIS — R42 Dizziness and giddiness: Secondary | ICD-10-CM | POA: Diagnosis not present

## 2022-05-02 NOTE — Progress Notes (Signed)
Established Patient Office Visit  Subjective   Patient ID: Laura Johnston, female    DOB: 1971/08/21  Age: 51 y.o. MRN: [id number]  Chief Complaint  Patient presents with   Hospital Follow up     Patient would like to discuss elevated BP     HPI She is here today for follow-up from the emergency department.  She was seen at [facility] on May 31 for arm numbness and  dizziness.  She says her symptoms started when she was getting out of the car to go to work and looked down and felt like she was off balance.  She noted that every time she looked down she would get that same sensation.  She said she just felt a little off so she checked her blood pressure at work and it was elevated.  Initial blood pressure was 143/99 with a pulse of 85.  She continued to track it and it went up to 166/108 with a pulse of 100.  At that point she decided to go home and on her way driving home she called the nurse on call.  She at that point was experiencing a "pinching" sensation in the right side of her chest and started feeling numbness and tingling down her left arm.  She then later felt some discomfort and tingling in her left foot.  Also reported left foot numbness and tingling.  No prior history of stroke or TIAs.  CT of the head performed showing no acute intracranial abnormality.  Normal CBC, CMP and troponin.   Her symptoms completely resolved before she was discharged.  She has not experienced any more numbness or tingling in that left arm or left foot.  But she has had some intermittent left sided headaches over the temple area that are quite intense when they happen and she describes it as a pressure and then it last for maybe a minute and it is followed by a heaviness on that left side of her face over the facial cheek.  Then 2 days ago she also experienced some heart racing that lasted a most 30 minutes while she was lying in bed and then it finally eased off.  She continues to just feel a  little off balance on and off.     ROS    Objective:     BP 117/75   Pulse 100   Resp 16   Ht '5\' 4"'$  (1.626 m)   Wt 187 lb (84.8 kg)   LMP 10/25/2011   SpO2 96%   BMI 32.10 kg/m    Physical Exam Vitals and nursing note reviewed.  Constitutional:      Appearance: She is well-developed.  HENT:     Head: Normocephalic and atraumatic.     Right Ear: Tympanic membrane, ear canal and external ear normal.     Left Ear: Tympanic membrane, ear canal and external ear normal.     Nose: Nose normal.  Eyes:     Conjunctiva/sclera: Conjunctivae normal.     Pupils: Pupils are equal, round, and reactive to light.  Neck:     Thyroid: No thyromegaly.  Cardiovascular:     Rate and Rhythm: Normal rate and regular rhythm.     Heart sounds: Normal heart sounds.  Pulmonary:     Effort: Pulmonary effort is normal.     Breath sounds: Normal breath sounds. No wheezing.  Musculoskeletal:     Cervical back: Neck supple.  Lymphadenopathy:     Cervical: No cervical  adenopathy.  Skin:    General: Skin is warm and dry.  Neurological:     Mental Status: She is alert and oriented to person, place, and time.  Psychiatric:        Behavior: Behavior normal.     No results found for any visits on 05/02/22.    The ASCVD Risk score (Arnett DK, et al., 2019) failed to calculate for the following reasons:   Cannot find a previous HDL lab   Cannot find a previous total cholesterol lab    Assessment & Plan:   Problem List Items Addressed This Visit   None Visit Diagnoses     Vertigo    -  Primary   Elevated BP without diagnosis of hypertension       Relevant Orders   Lipid Panel w/reflex Direct LDL   Palpitations       Relevant Orders   LONG TERM MONITOR (3-14 DAYS)      Vertigo-I do think some of her symptoms are related to vertigo especially when it first started with feeling dizzy when she looked down [place].  Maneuver did trigger her dizziness especially to the right.  So  I gave her a handout for exercises to do on her own at home and see if this is helpful.  Consider formal PT if symptoms persist.  Palpitations-we will move forward with Zio patch for heart monitor.  Also lets like to better restratify her by getting cholesterol levels.  Elevated blood pressure-normally she has low blood pressure and it actually looks fantastic here today in the office but she is definitely measured several blood pressures at home that are high but she has been using a wrist cuff as well.  Which is not as accurate.  Return in about 1 month (around 06/01/2022) for results of Zio patch.    Beatrice Lecher, MD

## 2022-05-02 NOTE — Progress Notes (Unsigned)
Enrolled for [employer] to mail a ZIO XT long term holter monitor to the patients address on file.  

## 2022-05-06 DIAGNOSIS — R002 Palpitations: Secondary | ICD-10-CM

## 2022-05-10 ENCOUNTER — Telehealth: Payer: Self-pay | Admitting: Family Medicine

## 2022-05-10 DIAGNOSIS — R7301 Impaired fasting glucose: Secondary | ICD-10-CM | POA: Diagnosis not present

## 2022-05-10 DIAGNOSIS — R030 Elevated blood-pressure reading, without diagnosis of hypertension: Secondary | ICD-10-CM | POA: Diagnosis not present

## 2022-05-10 DIAGNOSIS — K760 Fatty (change of) liver, not elsewhere classified: Secondary | ICD-10-CM

## 2022-05-10 NOTE — Telephone Encounter (Signed)
Rework the cholesterol

## 2022-05-11 LAB — LIPID PANEL W/REFLEX DIRECT LDL
Cholesterol: 218 mg/dL — ABNORMAL HIGH (ref <200)
HDL: 61 mg/dL (ref >=50)
LDL Cholesterol (Calc): 132 mg/dL (calc) — ABNORMAL HIGH
Non-HDL Cholesterol (Calc): 157 mg/dL (calc) — ABNORMAL HIGH (ref <130)
Total CHOL/HDL Ratio: 3.6 (calc) (ref <5.0)
Triglycerides: 140 mg/dL (ref <150)

## 2022-05-11 NOTE — Progress Notes (Signed)
Hi Evette, LDL cholesterol is mildly elevated at 132.  Just encouraged her to continue to work on healthy diet and regular exercise to reduce that number.  The 10-year ASCVD risk score (Arnett DK, et al., 2019) is: 1.2%   Values used to calculate the score:     Age: 51 years     Sex: Female     Is Non-Hispanic African American: No     Diabetic: No     Tobacco smoker: No     Systolic Blood Pressure: 599 mmHg     Is BP treated: No     HDL Cholesterol: 61 mg/dL     Total Cholesterol: 218 mg/dL

## 2022-05-20 DIAGNOSIS — R002 Palpitations: Secondary | ICD-10-CM | POA: Diagnosis not present

## 2022-06-01 ENCOUNTER — Ambulatory Visit: Payer: BC Managed Care – PPO | Admitting: Family Medicine

## 2022-06-07 ENCOUNTER — Encounter: Payer: Self-pay | Admitting: Family Medicine

## 2022-06-07 ENCOUNTER — Ambulatory Visit: Payer: BC Managed Care – PPO | Admitting: Family Medicine

## 2022-06-07 VITALS — BP 118/63 | HR 94 | Ht 64.0 in | Wt 189.0 lb

## 2022-06-07 DIAGNOSIS — E782 Mixed hyperlipidemia: Secondary | ICD-10-CM | POA: Diagnosis not present

## 2022-06-07 DIAGNOSIS — I493 Ventricular premature depolarization: Secondary | ICD-10-CM | POA: Diagnosis not present

## 2022-06-07 DIAGNOSIS — R002 Palpitations: Secondary | ICD-10-CM

## 2022-06-07 DIAGNOSIS — G959 Disease of spinal cord, unspecified: Secondary | ICD-10-CM | POA: Diagnosis not present

## 2022-06-07 NOTE — Assessment & Plan Note (Signed)
Viewed results.  Right now her 10-year cardiovascular risk score is low at 1.3%.  Continue to work on healthy food choices and regular exercise to improve her numbers.

## 2022-06-07 NOTE — Progress Notes (Signed)
Established Patient Office Visit  Subjective   Patient ID: Laura Johnston, female    DOB: 01/30/1971  Age: 51 y.o. MRN: [id number]  Chief Complaint  Patient presents with   Results    HPI  Today to go over Zio patch results.  Results showed some occasional PVCs and PACs that were rare.  She did have a very fast heartbeat that only lasted about 4 beats and then stopped.  Also wanted to discuss her recent cholesterol results.  She says she notices that when her diastolic blood pressure goes up is when she does not feel well.  And wants to know what to do if her blood pressure goes high while she is at work.  ROS    Objective:     BP 118/63   Pulse 94   Ht '5\' 4"'$  (1.626 m)   Wt 189 lb (85.7 kg)   LMP 10/25/2011   SpO2 98%   BMI 32.44 kg/m     Physical Exam Vitals and nursing note reviewed.  Constitutional:      Appearance: She is well-developed.  HENT:     Head: Normocephalic and atraumatic.  Cardiovascular:     Rate and Rhythm: Normal rate and regular rhythm.     Heart sounds: Normal heart sounds.  Pulmonary:     Effort: Pulmonary effort is normal.     Breath sounds: Normal breath sounds.  Skin:    General: Skin is warm and dry.  Neurological:     Mental Status: She is alert and oriented to person, place, and time.  Psychiatric:        Behavior: Behavior normal.      No results found for any visits on 06/07/22.  Last lipids Lab Results  Component Value Date   CHOL 218 (H) 05/10/2022   HDL 61 05/10/2022   LDLCALC 132 (H) 05/10/2022   TRIG 140 05/10/2022   CHOLHDL 3.6 05/10/2022      The 10-year ASCVD risk score (Arnett DK, et al., 2019) is: 1.3%    Assessment & Plan:   Problem List Items Addressed This Visit       Other   Palpitations    Reviewed results of the heart monitor.  Showed some rare PVCs and PACs.  Short bursts of SVT with the longest lasting 4 beats.  She did have 2 triggered events that did correlate with some symptoms of  sinus tachycardia.  No significant arrhythmias.  We discussed the potential use of beta-blockers to help control symptoms if she would like but it is certainly optional and not required.  We also discussed that if symptoms worsen, become more frequent, or last longer then I am more than happy to refer her to cardiology for further work-up.      Hyperlipidemia    Viewed results.  Right now her 10-year cardiovascular risk score is low at 1.3%.  Continue to work on healthy food choices and regular exercise to improve her numbers.      Other Visit Diagnoses     PVC's (premature ventricular contractions)    -  Primary      Reviewed results with her.  She opted to hold off on starting a beta-blocker at least for now.  We did discuss that if symptoms become more frequent, worse or last longer to please let us know and we will plan to refer to cardiology if that happens.  Blood pressures well controlled this time.  Pressure looks absolutely fantastic today.  We  did discuss that if it goes high to make sure that she is hydrating well drink plenty of fluids, sit rest and relax and try to retake the blood pressure.  Try to avoid excess salt.  She says she already tries to do that but be mindful and see if there is any correlation to medications, decongestants, NSAIDs, salt intake, lack of sleep etc. when her blood pressure does go high.  At this point I do not see a necessity for blood pressure medication but she can continue to monitor it regularly.   No follow-ups on file.   I spent 30 minutes on the day of the encounter to include pre-visit record review, face-to-face time with the patient and post visit ordering of test.   Beatrice Lecher, MD

## 2022-06-07 NOTE — Assessment & Plan Note (Signed)
Reviewed results of the heart monitor.  Showed some rare PVCs and PACs.  Short bursts of SVT with the longest lasting 4 beats.  She did have 2 triggered events that did correlate with some symptoms of sinus tachycardia.  No significant arrhythmias.  We discussed the potential use of beta-blockers to help control symptoms if she would like but it is certainly optional and not required.  We also discussed that if symptoms worsen, become more frequent, or last longer then I am more than happy to refer her to cardiology for further work-up.

## 2022-08-04 ENCOUNTER — Encounter: Payer: Self-pay | Admitting: Family Medicine

## 2022-08-04 ENCOUNTER — Ambulatory Visit: Payer: BC Managed Care – PPO | Admitting: Family Medicine

## 2022-08-04 VITALS — BP 126/81 | HR 118 | Temp 98.2°F | Ht 64.0 in | Wt 184.0 lb

## 2022-08-04 DIAGNOSIS — U071 COVID-19: Secondary | ICD-10-CM | POA: Diagnosis not present

## 2022-08-04 DIAGNOSIS — Z20828 Contact with and (suspected) exposure to other viral communicable diseases: Secondary | ICD-10-CM | POA: Diagnosis not present

## 2022-08-04 DIAGNOSIS — Z20822 Contact with and (suspected) exposure to covid-19: Secondary | ICD-10-CM

## 2022-08-04 DIAGNOSIS — K589 Irritable bowel syndrome without diarrhea: Secondary | ICD-10-CM | POA: Insufficient documentation

## 2022-08-04 HISTORY — DX: COVID-19: U07.1

## 2022-08-04 LAB — POCT INFLUENZA A/B
Influenza A, POC: NEGATIVE
Influenza B, POC: NEGATIVE

## 2022-08-04 LAB — POC COVID19 BINAXNOW: SARS Coronavirus 2 Ag: POSITIVE — AB

## 2022-08-04 MED ORDER — NIRMATRELVIR/RITONAVIR (PAXLOVID)TABLET: 3.0000 | ORAL_TABLET | Freq: Two times a day (BID) | ORAL | 0 refills | Status: AC

## 2022-08-04 NOTE — Assessment & Plan Note (Signed)
Supportive care discussed and recommended.  May use OTC analgesics/antipyretic/antitussive. Recommend increased fluids and relative rest.  Precautions around home discussed as she reports her husbands health is not very good.  Will start paxlovid at normal strength.  Side effects of medication reviewed, she has taken this previously.  Remain out of work x5 days then return with mask for an additional 5 days.  Contact clinic if developing worsening symptoms.

## 2022-08-04 NOTE — Progress Notes (Signed)
Laura Johnston - 51 y.o. female MRN [id number]  Date of birth: 05-31-1971  Subjective Chief Complaint  Patient presents with   sinus congestion   Headache   Cough   Oral Swelling   Diarrhea    HPI Laura Johnston is a 51 y.o. female here today with complaint of nasal congestions, headache, fever, chills, and diarrhea.  She began having symptoms last night. Negative COVID test last night but tested positive at home this morning.  Her son had fever a few days ago but felt better after a day or two.  She has not had any significant cough with this and denies shortness of breath, nausea or vomiting.  Her appetite is decreased but she drinking plenty of fluids.  She did have initial COVID immunizations but no boosters.   ROS:  A comprehensive ROS was completed and negative except as noted per HPI  Allergies  Allergen Reactions   Tape     Other reaction(s): Redness   Cortisone    Hydrocortisone    Meloxicam    Meloxicam     Paralysis   Pseudoephedrine     REACTION: numbness to fingers/tongue    Past Medical History:  Diagnosis Date   Abnormal Pap smear    Allergy    mild    AMA (advanced maternal age) multigravida 35+    Arthritis    knees, back    GERD (gastroesophageal reflux disease)    Gestational diabetes    no meds - had gestational diabetes ONLY    Osteopenia    Prediabetes 01/23/2018   Vaginal delivery 08/19/2011    Past Surgical History:  Procedure Laterality Date   APPENDECTOMY     COLONOSCOPY  09/04/2020   KNEE SURGERY     LEEP     TUMOR REMOVAL     from buttocks    Social History   Socioeconomic History   Marital status: Married    Spouse name: Not on file   Number of children: Not on file   Years of education: Not on file   Highest education level: Not on file  Occupational History   Not on file  Tobacco Use   Smoking status: Former    Years: 2.00    Types: Cigarettes    Quit date: 01/23/2016    Years since quitting: 6.5    Smokeless tobacco: Never   Tobacco comments:    1-3 cig/day  Vaping Use   Vaping Use: Never used  Substance and Sexual Activity   Alcohol use: No   Drug use: No   Sexual activity: Never  Other Topics Concern   Not on file  Social History Narrative   Not on file   Social Determinants of Health   Financial Resource Strain: Not on file  Food Insecurity: Not on file  Transportation Needs: Not on file  Physical Activity: Not on file  Stress: Not on file  Social Connections: Not on file    Family History  Problem Relation Age of Onset   Stroke Father    Hypertension Father    Hyperlipidemia Father    Heart disease Father    Diabetes Maternal Uncle    Stroke Paternal Grandmother    Colon polyps Mother    Colon cancer Neg Hx    Esophageal cancer Neg Hx    Rectal cancer Neg Hx    Stomach cancer Neg Hx     Health Maintenance  Topic Date Due   PAP SMEAR-Modifier  12/29/2022 (Originally 05/28/2022)  COVID-19 Vaccine (3 - Pfizer series) 02/17/2023 (Originally 07/23/2021)   Zoster Vaccines- Shingrix (1 of 2) 02/17/2023 (Originally 02/06/2020)   INFLUENZA VACCINE  02/26/2023 (Originally 06/28/2022)   MAMMOGRAM  08/05/2023 (Originally 07/20/2022)   COLONOSCOPY (Pts 45-50yr Insurance coverage will need to be confirmed)  09/05/2027   TETANUS/TDAP  02/17/2032   Hepatitis C Screening  Completed   HIV Screening  Completed   Pneumococcal Vaccine [id number] old  Aged Out   HPV VACCINES  Aged Out     ----------------------------------------------------------------------------------------------------------------------------------------------------------------------------------------------------------------- Physical Exam BP 126/81 (BP Location: Left Arm, Patient Position: Sitting, Cuff Size: Normal)   Pulse (!) 118   Temp 98.2 F (36.8 C) (Oral)   Ht '5\' 4"'$  (1.626 m)   Wt 184 lb (83.5 kg)   LMP 10/25/2011   SpO2 96%   BMI 31.58 kg/m   Physical Exam Constitutional:       Appearance: She is well-developed.  Eyes:     General: No scleral icterus. Cardiovascular:     Rate and Rhythm: Normal rate and regular rhythm.  Pulmonary:     Effort: Pulmonary effort is normal.     Breath sounds: Normal breath sounds.  Musculoskeletal:     Cervical back: Neck supple.  Neurological:     Mental Status: She is alert.  Psychiatric:        Mood and Affect: Mood normal.        Behavior: Behavior normal.     ------------------------------------------------------------------------------------------------------------------------------------------------------------------------------------------------------------------- Assessment and Plan  COVID-19 Supportive care discussed and recommended.  May use OTC analgesics/antipyretic/antitussive. Recommend increased fluids and relative rest.  Precautions around home discussed as she reports her husbands health is not very good.  Will start paxlovid at normal strength.  Side effects of medication reviewed, she has taken this previously.  Remain out of work x5 days then return with mask for an additional 5 days.  Contact clinic if developing worsening symptoms.    Meds ordered this encounter  Medications   nirmatrelvir/ritonavir EUA (PAXLOVID) 20 x 150 MG & 10 x '100MG'$  TABS    Sig: Take 3 tablets by mouth 2 (two) times daily for 5 days. (Take nirmatrelvir 150 mg two tablets twice daily for 5 days and ritonavir 100 mg one tablet twice daily for 5 days) Patient GFR is 111    Dispense:  30 tablet    Refill:  0    No follow-ups on file.    This visit occurred during the SARS-CoV-2 public health emergency.  Safety protocols were in place, including screening questions prior to the visit, additional usage of staff PPE, and extensive cleaning of exam room while observing appropriate contact time as indicated for disinfecting solutions.

## 2022-08-04 NOTE — Patient Instructions (Addendum)
Start paxlovid.  Continue to mask at home to protect other family members.  Contact clinic if having worsening symptoms.   Remain out of work for 5 days.  You may return after 5 days but you should continue to wear a mask for an additional 5 days.      Infection Prevention in the Home If you have an infection, may have been exposed to an infection, or are taking care of someone who has an infection, it is important to know how to keep the infection from spreading. Follow your health care provider's instructions and use these guidelines to help stop the spread of infection. How infections are spread In order for an infection to spread, the following must be present: A germ. This may be a virus, bacteria, fungus, or parasite. A place for the germ to live. This may be: On or in a person, animal, plant, or food. In soil or water. On surfaces, such as a door handle. A person or animal who can develop a disease if the germ enters the body (host). The host does not have resistance to the germ. A way for the germ to enter the host. This may occur by: Direct contact with an infected person or animal. This can happen through shaking hands or hugging. Some germs can also travel through the air and spread to others. This can happen when an infected person coughs or sneezes on or near other people. Indirect contact. This occurs when the germ enters the host through contact with an infected object. Examples include: Eating or drinking food or water that is contaminated with the germ. Touching a contaminated surface with your hands, and then touching your face, eyes, nose, or mouth. Supplies needed: Soap. Alcohol-based hand sanitizer. Standard cleaning products. Disinfectants, such as bleach. Reusable cleaning cloths, sponges, or paper towels. Disposable or reusable utility gloves. How to prevent infection from spreading There are several things that you can do to help prevent infection from  spreading. Take these general actions Everyone should take the following actions to prevent the spread of infection: Wash your hands often with soap and water for at least 20 seconds. If soap and water are not available, use alcohol-based hand sanitizer. Avoid touching your face, mouth, nose, or eyes. Cough or sneeze into a tissue, sleeve, or elbow instead of into your hand or into the air. If you cough or sneeze into a tissue, throw it away immediately and wash your hands.  Keep your bathroom clean Provide soap. Change towels and washcloths frequently. Change toothbrushes often and store them separately in a clean, dry place. Clean and disinfect all surfaces, including the toilet, floor, tub, shower, and sink. Do not share personal items, such as razors, toothbrushes, deodorant, combs, brushes, towels, and washcloths. Maintain hygiene in the [facility] your hands before and after preparing food and before you eat. Clean the inside of your refrigerator each week. Keep your refrigerator set at 683F (4C) or less, and set your freezer at 83F (-18C) or less. Keep work surfaces clean. Disinfect them regularly. Wash your dishes in hot, soapy water. Air-dry your dishes or use a dishwasher. Do not share dishes or eating utensils. Handle food safely Store food carefully. Refrigerate leftovers promptly in covered containers. Throw out stale or spoiled food. Thaw foods in the refrigerator or microwave, not at room temperature. Serve foods at the proper temperature. Do not eat raw meat. Make sure it is cooked to the appropriate temperature. Cook eggs until they are  firm. Wash fruits and vegetables under running water. Use separate cutting boards, plates, and utensils for raw foods and cooked foods. Use a clean spoon each time you sample food while cooking. Do laundry the right way Wear gloves if laundry is visibly soiled. Do not shake soiled laundry. Doing that may send germs into the  air. Wash laundry in hot water. If you cannot wash the laundry right away, place it in a plastic bag and wash it as soon as possible. Be careful around animals and pets Wash your hands before and after touching animals. If you have a pet, ensure that your pet stays clean. Do not let people with weak immune systems touch bird droppings, fish tank water, or a litter box. If you have a pet cage or litter box, be sure to clean it every day. If you are sick, stay away from animals and have someone else care for them if possible. How to clean and disinfect objects and surfaces Precautions Some disinfectants work for certain germs and not others. Read the manufacturer's instructions or read online resources to determine if the product you are using will work for the germ you are trying to remove. If you choose to use bleach, use it safely. Never mix it with other cleaning products, especially those that contain ammonia. This mixture can create a dangerous gas that may be deadly. Keep proper movement of fresh air in your home (ventilation). Pour used mop water down the utility sink or toilet. Do not pour this water down the kitchen sink. Objects and surfaces  If surfaces are visibly soiled, clean them first with soap and water before disinfecting. Disinfect surfaces that are frequently touched every day. This may include: Counters. Tables. Doorknobs. Sinks and faucets. Electronics, such as: Architectural technologist. Remote controls. Keyboards. Computers and tablets. Cleaning supplies Some cleaning supplies can breed germs. Take good care of them to prevent germs from spreading. To do this: Soak toilet brushes, mops, and sponges in bleach and water for 5 minutes after each use, or according to manufacturer's instructions. Wash reusable cleaning cloths and sanitize sponges after each use. Throw away disposable gloves after one use. Replace reusable utility gloves if they are cracked or torn or if they start to  peel. Additional actions if you are sick If you live with other people:  Avoid close contact with those around you. Stay at least 3 ft (1 m) away from others, if possible. Use a separate bathroom, if possible. If possible, sleep in a separate bedroom or in a separate bed to prevent infecting other household members. Change bedroom linens each week or whenever they are soiled. Have everyone in the household wash hands often with soap and water for at least 20 seconds. If soap and water are not available, use alcohol-based hand sanitizer. In general: Stay home except to get medical care. Call ahead before visiting your health care provider. Ask others to get groceries and household supplies and to refill prescriptions for you. Avoid public areas. Try not to take public transportation. If you can, wear a mask if you need to go out of the house, or if you are in close contact with someone who is not sick. Avoid visitors until you have completely recovered, or until you have no signs and symptoms of infection. Avoid preparing food or providing care for others. If you must prepare food or provide care for others, wear a mask and wash your hands before and after doing these things. Where to find more  information Centers for Disease Control and Prevention: [employer].cy Summary It is important to know how to keep infection from spreading. Make sure everyone in your household washes their hands often with soap and water. Disinfect surfaces that are frequently touched every day. If you are sick, stay home except to get medical care. This information is not intended to replace advice given to you by your health care provider. Make sure you discuss any questions you have with your health care provider. Document Revised: 01/03/2022 Document Reviewed: 01/03/2022 Elsevier Patient Education  [place].

## 2022-09-29 ENCOUNTER — Encounter: Payer: Self-pay | Admitting: Family Medicine

## 2022-09-29 ENCOUNTER — Ambulatory Visit: Payer: BC Managed Care – PPO | Admitting: Family Medicine

## 2022-09-29 VITALS — BP 135/93 | HR 114 | Ht 64.0 in | Wt 190.0 lb

## 2022-09-29 DIAGNOSIS — J40 Bronchitis, not specified as acute or chronic: Secondary | ICD-10-CM | POA: Diagnosis not present

## 2022-09-29 DIAGNOSIS — J329 Chronic sinusitis, unspecified: Secondary | ICD-10-CM | POA: Insufficient documentation

## 2022-09-29 DIAGNOSIS — R6889 Other general symptoms and signs: Secondary | ICD-10-CM | POA: Insufficient documentation

## 2022-09-29 LAB — POCT INFLUENZA A/B
Influenza A, POC: NEGATIVE
Influenza B, POC: NEGATIVE

## 2022-09-29 LAB — POC COVID19 BINAXNOW: SARS Coronavirus 2 Ag: NEGATIVE

## 2022-09-29 MED ORDER — AMOXICILLIN-POT CLAVULANATE 875-125 MG PO TABS: 1.0000 | ORAL_TABLET | Freq: Two times a day (BID) | ORAL | 0 refills | Status: DC

## 2022-09-29 NOTE — Progress Notes (Signed)
   Acute Office Visit  Subjective:     Patient ID: Laura Johnston, female    DOB: Apr 15, 1971, 51 y.o.   MRN: [id number]  Chief Complaint  Patient presents with   Nasal Congestion    HPI Patient is in today for flu like symptoms. She was seen in September and diagnosed with COVID-19.  ROS      Objective:    BP (!) 135/93   Pulse (!) 114   Ht '5\' 4"'$  (1.626 m)   Wt 190 lb (86.2 kg)   LMP 10/25/2011   BMI 32.61 kg/m    Physical Exam Vitals and nursing note reviewed.  Constitutional:      General: She is not in acute distress.    Appearance: Normal appearance.  HENT:     Head: Normocephalic and atraumatic.     Comments: Tenderness to palpation of frontal and maxillary sinuses bilaterally    Right Ear: External ear normal.     Left Ear: External ear normal.     Nose: Nose normal.     Mouth/Throat:     Pharynx: Posterior oropharyngeal erythema present. No oropharyngeal exudate.  Eyes:     Conjunctiva/sclera: Conjunctivae normal.  Cardiovascular:     Rate and Rhythm: Normal rate and regular rhythm.  Pulmonary:     Effort: Pulmonary effort is normal.     Breath sounds: Normal breath sounds.  Neurological:     General: No focal deficit present.     Mental Status: She is alert and oriented to person, place, and time.  Psychiatric:        Mood and Affect: Mood normal.        Behavior: Behavior normal.        Thought Content: Thought content normal.        Judgment: Judgment normal.     No results found for any visits on 09/29/22.      Assessment & Plan:   Problem List Items Addressed This Visit       Respiratory   Sinobronchitis - Primary    - sinus congestion with worsening symptoms. On physical exam, tenderness to palpation of frontal and maxillary sinuses - since pt's symptoms have progressively worsened we will go ahead and treat with augmentin  - recommend rest, fluids      Relevant Medications   amoxicillin-clavulanate (AUGMENTIN) 875-125 MG  tablet     Other   Flu-like symptoms    - flu and covid tested both interpreted by myself as negative.       Meds ordered this encounter  Medications   amoxicillin-clavulanate (AUGMENTIN) 875-125 MG tablet    Sig: Take 1 tablet by mouth 2 (two) times daily.    Dispense:  20 tablet    Refill:  0    No follow-ups on file.  Owens Loffler, DO

## 2022-09-29 NOTE — Assessment & Plan Note (Signed)
-   flu and covid tested both interpreted by myself as negative.

## 2022-09-29 NOTE — Addendum Note (Signed)
Addended by: Cline Crock on: 09/29/2022 02:30 PM   Modules accepted: Orders

## 2022-09-29 NOTE — Assessment & Plan Note (Signed)
-   sinus congestion with worsening symptoms. On physical exam, tenderness to palpation of frontal and maxillary sinuses - since pt's symptoms have progressively worsened we will go ahead and treat with augmentin  - recommend rest, fluids

## 2022-09-30 ENCOUNTER — Encounter: Payer: Self-pay | Admitting: Family Medicine

## 2022-09-30 ENCOUNTER — Other Ambulatory Visit: Payer: Self-pay | Admitting: Family Medicine

## 2022-09-30 MED ORDER — HYDROCOD POLI-CHLORPHE POLI ER 10-8 MG/5ML PO SUER: 5.0000 mL | Freq: Two times a day (BID) | ORAL | 0 refills | Status: DC | PRN

## 2022-10-14 DIAGNOSIS — H5203 Hypermetropia, bilateral: Secondary | ICD-10-CM | POA: Diagnosis not present

## 2022-11-15 ENCOUNTER — Telehealth: Payer: Self-pay | Admitting: Family Medicine

## 2022-11-15 MED ORDER — OSELTAMIVIR PHOSPHATE 75 MG PO CAPS: 75.0000 mg | ORAL_CAPSULE | Freq: Every day | ORAL | 0 refills | Status: DC

## 2022-11-15 NOTE — Telephone Encounter (Signed)
Patient stated son has the flu and requesting Preventive medication for flu for her and her spouse if possible.

## 2022-11-15 NOTE — Telephone Encounter (Signed)
Meds ordered this encounter  Medications   oseltamivir (TAMIFLU) 75 MG capsule    Sig: Take 1 capsule (75 mg total) by mouth daily.    Dispense:  10 capsule    Refill:  0

## 2022-11-16 NOTE — Telephone Encounter (Signed)
Patient advised.

## 2022-11-16 NOTE — Telephone Encounter (Signed)
Called patient in regards to medication sent no answer left message.

## 2022-11-30 DIAGNOSIS — Z1231 Encounter for screening mammogram for malignant neoplasm of breast: Secondary | ICD-10-CM | POA: Diagnosis not present

## 2022-11-30 DIAGNOSIS — E559 Vitamin D deficiency, unspecified: Secondary | ICD-10-CM | POA: Diagnosis not present

## 2022-11-30 DIAGNOSIS — Z01419 Encounter for gynecological examination (general) (routine) without abnormal findings: Secondary | ICD-10-CM | POA: Diagnosis not present

## 2022-11-30 DIAGNOSIS — Z124 Encounter for screening for malignant neoplasm of cervix: Secondary | ICD-10-CM | POA: Diagnosis not present

## 2022-11-30 DIAGNOSIS — Z13820 Encounter for screening for osteoporosis: Secondary | ICD-10-CM | POA: Diagnosis not present

## 2022-11-30 DIAGNOSIS — Z6834 Body mass index (BMI) 34.0-34.9, adult: Secondary | ICD-10-CM | POA: Diagnosis not present

## 2022-11-30 LAB — HM DEXA SCAN

## 2022-12-22 ENCOUNTER — Telehealth: Payer: Self-pay | Admitting: Family Medicine

## 2022-12-22 NOTE — Telephone Encounter (Signed)
Spoke w/pt and advised her that since Dr. Gaetano Net was the referring doctor that sent her to Endocrinology she should contact his office to inform them that she doesn't want to be seen by an NP and ask that they send her referral back requesting that she be seen by an MD instead. I told her that Dr. Madilyn Fireman is not going to do the referral for this since he has already taken care of this for her. She also stated that she had an appt with the endocrinologist this morning but cancelled this due to it being scheduled with an NP.  She stated that she will contact Dr. Gaetano Net.

## 2022-12-22 NOTE — Telephone Encounter (Signed)
Patient requests a referral to Endocrinology. Patient states her OB/GYN sent her to see a family nurse practitioner and she is unhappy with that referral. Patient states she wants to see a MD. Please advise.

## 2022-12-22 NOTE — Telephone Encounter (Signed)
Agree with below

## 2023-03-03 DIAGNOSIS — Z1330 Encounter for screening examination for mental health and behavioral disorders, unspecified: Secondary | ICD-10-CM | POA: Diagnosis not present

## 2023-03-03 DIAGNOSIS — E559 Vitamin D deficiency, unspecified: Secondary | ICD-10-CM | POA: Diagnosis not present

## 2023-03-03 DIAGNOSIS — M8589 Other specified disorders of bone density and structure, multiple sites: Secondary | ICD-10-CM | POA: Diagnosis not present

## 2023-03-29 ENCOUNTER — Ambulatory Visit: Payer: BC Managed Care – PPO | Admitting: Sports Medicine

## 2023-03-29 ENCOUNTER — Ambulatory Visit (INDEPENDENT_AMBULATORY_CARE_PROVIDER_SITE_OTHER): Payer: BC Managed Care – PPO

## 2023-03-29 ENCOUNTER — Encounter: Payer: Self-pay | Admitting: Sports Medicine

## 2023-03-29 DIAGNOSIS — M5030 Other cervical disc degeneration, unspecified cervical region: Secondary | ICD-10-CM

## 2023-03-29 DIAGNOSIS — M47812 Spondylosis without myelopathy or radiculopathy, cervical region: Secondary | ICD-10-CM | POA: Diagnosis not present

## 2023-03-29 DIAGNOSIS — M7741 Metatarsalgia, right foot: Secondary | ICD-10-CM | POA: Insufficient documentation

## 2023-03-29 MED ORDER — PREDNISONE 50 MG PO TABS: ORAL_TABLET | ORAL | 0 refills | Status: DC

## 2023-03-29 MED ORDER — CYCLOBENZAPRINE HCL 10 MG PO TABS: ORAL_TABLET | ORAL | 0 refills | Status: DC

## 2023-03-29 NOTE — Assessment & Plan Note (Signed)
This is a pleasant 52 year old female, she has had increasing pain axial neck, left-sided with left-sided periscapular symptoms and discomfort radiating down to the upper arm on the left. No progressive weakness, trauma, constitutional symptoms. She did have an MRI done back in 2009 that showed DDD, likely worsened, adding 5 days of steroids, Flexeril at night, x-rays, formal PT, return in 6 weeks.

## 2023-03-29 NOTE — Progress Notes (Signed)
    Procedures performed today:    None.  Independent interpretation of notes and tests performed by another provider:   None.  Brief History, Exam, Impression, and Recommendations:    DDD (degenerative disc disease), cervical This is a pleasant 52 year old female, she has had increasing pain axial neck, left-sided with left-sided periscapular symptoms and discomfort radiating down to the upper arm on the left. No progressive weakness, trauma, constitutional symptoms. She did have an MRI done back in 2009 that showed DDD, likely worsened, adding 5 days of steroids, Flexeril at night, x-rays, formal PT, return in 6 weeks.  Metatarsalgia, right foot Also complaining of pain right second through the MTPs plantar aspect, these are tender to palpation, suspect metatarsalgia, she will avoid barefoot walking for now, will see this back in about 6 weeks.    ____________________________________________ Ihor Austin. Benjamin Stain, M.D., ABFM., CAQSM., AME. Primary Care and Sports Medicine [facility]  Adjunct Professor of Family Medicine  [employer]  Restaurant manager, [employer] food

## 2023-03-29 NOTE — Assessment & Plan Note (Signed)
Also complaining of pain right second through the MTPs plantar aspect, these are tender to palpation, suspect metatarsalgia, she will avoid barefoot walking for now, will see this back in about 6 weeks.

## 2023-03-30 ENCOUNTER — Ambulatory Visit: Payer: BC Managed Care – PPO | Attending: Sports Medicine | Admitting: Physical Therapy

## 2023-03-30 ENCOUNTER — Encounter: Payer: Self-pay | Admitting: Physical Therapy

## 2023-03-30 ENCOUNTER — Other Ambulatory Visit: Payer: Self-pay

## 2023-03-30 DIAGNOSIS — M5030 Other cervical disc degeneration, unspecified cervical region: Secondary | ICD-10-CM | POA: Insufficient documentation

## 2023-03-30 DIAGNOSIS — M542 Cervicalgia: Secondary | ICD-10-CM | POA: Insufficient documentation

## 2023-03-30 DIAGNOSIS — R293 Abnormal posture: Secondary | ICD-10-CM

## 2023-03-30 DIAGNOSIS — M6281 Muscle weakness (generalized): Secondary | ICD-10-CM

## 2023-03-30 DIAGNOSIS — M546 Pain in thoracic spine: Secondary | ICD-10-CM | POA: Insufficient documentation

## 2023-03-30 NOTE — Therapy (Signed)
OUTPATIENT PHYSICAL THERAPY CERVICAL EVALUATION   Patient Name: Laura Johnston MRN: [id number] DOB:[birth year], 52 y.o., female Today's Date: 03/30/2023  END OF SESSION:  PT End of Session - 03/30/23 0845     Visit Number 1    Number of Visits 6    Date for PT Re-Evaluation 05/11/23    Authorization Type [employer]    PT Start Time 0845    PT Stop Time 0940    PT Time Calculation (min) 55 min    Activity Tolerance Patient tolerated treatment well    Behavior During Therapy WFL for tasks assessed/performed             Past Medical History:  Diagnosis Date   Abnormal Pap smear    Allergy    mild    AMA (advanced maternal age) multigravida 35+    Arthritis    knees, back    GERD (gastroesophageal reflux disease)    Gestational diabetes    no meds - had gestational diabetes ONLY    Osteopenia    Prediabetes 01/23/2018   Vaginal delivery 08/19/2011   Past Surgical History:  Procedure Laterality Date   APPENDECTOMY     COLONOSCOPY  09/04/2020   KNEE SURGERY     LEEP     TUMOR REMOVAL     from buttocks   Patient Active Problem List   Diagnosis Date Noted   DDD (degenerative disc disease), cervical 03/29/2023   Metatarsalgia, right foot 03/29/2023   Sinobronchitis 09/29/2022   Flu-like symptoms 09/29/2022   Irritable bowel syndrome 08/04/2022   COVID-19 08/04/2022   Osteopenia 08/31/2021   Spinal cord compression, thoracic 01/03/2020   Cramp in lower extremity associated with sleep 12/04/2019   First degree ankle sprain, left, initial encounter 04/13/2018   Pulmonary scarring 01/31/2018   Class 1 obesity due to excess calories with serious comorbidity in adult 01/31/2018   Prediabetes 01/23/2018   Eye muscle twitches 01/22/2018   Palpitations 05/17/2017   Primary osteoarthritis of both knees 03/02/2017   Primary osteoarthritis of right knee 03/02/2017   Fatty liver disease, nonalcoholic 08/31/2016   Vitamin D deficiency 07/20/2016   Hyperlipidemia 03/08/2010    DIZZINESS 02/11/2010    PCP: Nani Gasser, MD  REFERRING PROVIDER: Monica Becton, MD  REFERRING DIAG: M50.30 (ICD-10-CM) - DDD (degenerative disc disease), cervical  Rationale for Evaluation and Treatment: Rehabilitation  THERAPY DIAG:  Cervicalgia  Pain in thoracic spine  Muscle weakness (generalized)  Abnormal posture  ONSET DATE: 03/27/23  SUBJECTIVE:  SUBJECTIVE STATEMENT: Pt reports she woke up Monday with her neck stiff and hurting her. She notes pain radiated down her L arm and thoracic spine. Pt states it hurt her to press her back against a chair. She reports turning to the L she feels she pulled something in her neck. The left arm pain is starting to go down now. When she coughs or sneezes she feels it in her upper back. Pt reports she feels better throughout the day but when trying to go to bed it bothers her the most. She thinks it's releasing a little bit but still there.   PERTINENT HISTORY:  Pt states she has a bulging disc, back surgery  PAIN:  Are you having pain? Yes: NPRS scale: 5 at rest, 10 at worst /10 Pain location: L shoulder/neck Pain description: Tightness from bilat neck to shoulders, sharp pain Aggravating factors: Turning head, laying down/sleeping Relieving factors: Neck massager, neck traction  PRECAUTIONS: None  WEIGHT BEARING RESTRICTIONS: No  FALLS:  Has patient fallen in last 6 months? No  LIVING ENVIRONMENT: Lives with: lives with their family and lives with their spouse Son Lives in: House/apartment Stairs: n/a Has following equipment at home: None  OCCUPATION: Computer work at office  PLOF: Independent  PATIENT GOALS: Improve pain  NEXT MD VISIT: 05/10/23 with Dr. Karie Schwalbe  OBJECTIVE:   DIAGNOSTIC FINDINGS:  Cervical x-ray  03/29/23 impression: Degenerative disc disease  Bulging disc  No recent injury  No sx   PATIENT SURVEYS:  NDI 48%  COGNITION: Overall cognitive status: Within functional limits for tasks assessed     SENSATION: Pt reports phalanges 1-3 has N/T at night R&L  CERVICAL ROM:   Active ROM A/PROM (deg) eval  Flexion 45  Extension 10*  Right lateral flexion 30  Left lateral flexion 25*  Right rotation 50  Left rotation 35*   (Blank rows = not tested)  * = concordant pain  UPPER EXTREMITY MMT:  MMT Right eval Left eval  Shoulder flexion 5 5  Shoulder extension    Shoulder abduction 5 4  Shoulder adduction    Shoulder extension 5 5  Shoulder internal rotation 5 5  Shoulder external rotation 5 5  Middle trapezius    Lower trapezius    Elbow flexion    Elbow extension    Wrist flexion    Wrist extension    Wrist ulnar deviation    Wrist radial deviation    Wrist pronation    Wrist supination    Grip strength     (Blank rows = not tested)  UPPER EXTREMITY ROM: All grossly WFL  POSTURE: rounded shoulders, forward head, and increased thoracic kyphosis  PALPATION: TTP L UT, levator scap, thoracic paraspinal, mid/low trap  CERVICAL SPECIAL TESTS:  Distraction test: Negative  FUNCTIONAL TESTS:  Did not assess  GAIT: Distance walked: 100' Assistive device utilized: None Level of assistance: Complete Independence Comments: WFL  TODAY'S TREATMENT:  DATE: 03/30/23 Manual therapy Grade II to III cervical side glide C3-4 for rotation Self massage/TPR midback musculature  Therapeutic Exercise MWM with cervical rotation Cervical ext + lateral flexion iso Levator scap stretch UT stretch S/L open/close book    PATIENT EDUCATION:  Education details: Exam findings, POC, initial HEP Person educated: Patient Education method: Explanation,  Demonstration, and Handouts Education comprehension: verbalized understanding, returned demonstration, and needs further education  HOME EXERCISE PROGRAM: Access Code: [email or web address] URL: [email or web address] Date: 03/30/2023 Prepared by: Vernon Prey April Kirstie Peri  Exercises - Sidelying Thoracic Rotation with Open Book  - 1 x daily - 7 x weekly - 1 sets - 5 reps - 5 sec hold - Standing Cervical Rotation Stretch  - 1 x daily - 7 x weekly - 5 sets - 5 reps - Seated Upper Trapezius Stretch  - 1 x daily - 7 x weekly - 2 sets - 30 sec hold - Gentle Levator Scapulae Stretch  - 1 x daily - 7 x weekly - 2 sets - 30 sec hold  ASSESSMENT:  CLINICAL IMPRESSION: Patient is a 52 y.o. F who was seen today for physical therapy evaluation and treatment for neck/shoulder/mid back pain. Assessment significant for reduced cervical ROM due to upper cervical hypomobility -- improved with MWM techniques as well as neck/midback muscle tightness. No overt shoulder weakness noted; however, she likely has decreased posterior shoulder girdle strength. Primarily focused on cervical component of pt's discomfort. After improving cervical ROM pt continues to feel pull into L midback area. Will try and address next session.   OBJECTIVE IMPAIRMENTS: decreased activity tolerance, decreased endurance, decreased mobility, decreased ROM, decreased strength, hypomobility, increased fascial restrictions, increased muscle spasms, impaired flexibility, improper body mechanics, postural dysfunction, and pain.   ACTIVITY LIMITATIONS: carrying, lifting, and sleeping  PARTICIPATION LIMITATIONS: community activity and occupation  PERSONAL FACTORS: Age, Past/current experiences, Profession, and Time since onset of injury/illness/exacerbation are also affecting patient's functional outcome.   REHAB POTENTIAL: Good  CLINICAL DECISION MAKING: Stable/uncomplicated  EVALUATION COMPLEXITY: Low   GOALS: Goals reviewed with  patient? Yes  SHORT TERM GOALS: Target date: 04/20/2023   Pt will be ind with initial HEP Baseline: Goal status: INITIAL  2.  Pt will demo pain free and full neck ROM Baseline:  Goal status: INITIAL   LONG TERM GOALS: Target date: 05/11/2023   Pt will be ind with maintenance and progression of HEP Baseline:  Goal status: INITIAL  2.  Pt will be able to sleep throughout the night per PLOF Baseline:  Goal status: INITIAL  3.  Pt will be able to carry her purse on her L shoulder without pain for community mobility Baseline:  Goal status: INITIAL  4.  NDI will be </=38% to demo MCID Baseline:  Goal status: INITIAL  5.  Pt will report decrease in pain by >/=50% Baseline:  Goal status: INITIAL  PLAN:  PT FREQUENCY: 1x/week  PT DURATION: 6 weeks  PLANNED INTERVENTIONS: Therapeutic exercises, Therapeutic activity, Neuromuscular re-education, Balance training, Gait training, Patient/Family education, Self Care, Joint mobilization, Aquatic Therapy, Dry Needling, Electrical stimulation, Spinal manipulation, Spinal mobilization, Cryotherapy, Moist heat, Taping, Traction, Ionotophoresis 4mg /ml Dexamethasone, Manual therapy, and Re-evaluation.  PLAN FOR NEXT SESSION: Assess response to HEP. Manual work as indicated. Work on Counsellor.    Andrell Tallman April Ma L Daryll Spisak, PT 03/30/2023, 10:07 AM

## 2023-04-06 ENCOUNTER — Encounter: Payer: Self-pay | Admitting: Physical Therapy

## 2023-04-06 ENCOUNTER — Ambulatory Visit: Payer: BC Managed Care – PPO | Admitting: Physical Therapy

## 2023-04-06 DIAGNOSIS — R293 Abnormal posture: Secondary | ICD-10-CM | POA: Diagnosis not present

## 2023-04-06 DIAGNOSIS — M6281 Muscle weakness (generalized): Secondary | ICD-10-CM | POA: Diagnosis not present

## 2023-04-06 DIAGNOSIS — M5030 Other cervical disc degeneration, unspecified cervical region: Secondary | ICD-10-CM | POA: Diagnosis not present

## 2023-04-06 DIAGNOSIS — M546 Pain in thoracic spine: Secondary | ICD-10-CM

## 2023-04-06 DIAGNOSIS — M542 Cervicalgia: Secondary | ICD-10-CM

## 2023-04-06 NOTE — Therapy (Signed)
OUTPATIENT PHYSICAL THERAPY CERVICAL EVALUATION   Patient Name: Laura Johnston MRN: [id number] DOB:[birth year], 52 y.o., female Today's Date: 04/06/2023  END OF SESSION:  PT End of Session - 04/06/23 1017     Visit Number 2    Number of Visits 6    Date for PT Re-Evaluation 05/11/23    Authorization Type [employer]    PT Start Time 1017    PT Stop Time 1100    PT Time Calculation (min) 43 min    Activity Tolerance Patient tolerated treatment well    Behavior During Therapy WFL for tasks assessed/performed             Past Medical History:  Diagnosis Date   Abnormal Pap smear    Allergy    mild    AMA (advanced maternal age) multigravida 35+    Arthritis    knees, back    GERD (gastroesophageal reflux disease)    Gestational diabetes    no meds - had gestational diabetes ONLY    Osteopenia    Prediabetes 01/23/2018   Vaginal delivery 08/19/2011   Past Surgical History:  Procedure Laterality Date   APPENDECTOMY     COLONOSCOPY  09/04/2020   KNEE SURGERY     LEEP     TUMOR REMOVAL     from buttocks   Patient Active Problem List   Diagnosis Date Noted   DDD (degenerative disc disease), cervical 03/29/2023   Metatarsalgia, right foot 03/29/2023   Sinobronchitis 09/29/2022   Flu-like symptoms 09/29/2022   Irritable bowel syndrome 08/04/2022   COVID-19 08/04/2022   Osteopenia 08/31/2021   Spinal cord compression, thoracic 01/03/2020   Cramp in lower extremity associated with sleep 12/04/2019   First degree ankle sprain, left, initial encounter 04/13/2018   Pulmonary scarring 01/31/2018   Class 1 obesity due to excess calories with serious comorbidity in adult 01/31/2018   Prediabetes 01/23/2018   Eye muscle twitches 01/22/2018   Palpitations 05/17/2017   Primary osteoarthritis of both knees 03/02/2017   Primary osteoarthritis of right knee 03/02/2017   Fatty liver disease, nonalcoholic 08/31/2016   Vitamin D deficiency 07/20/2016   Hyperlipidemia 03/08/2010    DIZZINESS 02/11/2010    PCP: Nani Gasser, MD  REFERRING PROVIDER: Monica Becton, MD  REFERRING DIAG: M50.30 (ICD-10-CM) - DDD (degenerative disc disease), cervical  Rationale for Evaluation and Treatment: Rehabilitation  THERAPY DIAG:  Cervicalgia  Pain in thoracic spine  Muscle weakness (generalized)  Abnormal posture  ONSET DATE: 03/27/23  SUBJECTIVE:  SUBJECTIVE STATEMENT: Pt reports she was only a little bit sore after eval. Pt states she was able to maintain neck motion but can still feel a slight pull. Pt reports it took a while to find a comfortable position for sleep but she was able to sleep through the night. Notes the worst tightness is when trying to go to bed and then in the afternoon while working.   From eval: Pt reports she woke up Monday with her neck stiff and hurting her. She notes pain radiated down her L arm and thoracic spine. Pt states it hurt her to press her back against a chair. She reports turning to the L she feels she pulled something in her neck. The left arm pain is starting to go down now. When she coughs or sneezes she feels it in her upper back. Pt reports she feels better throughout the day but when trying to go to bed it bothers her the most. She thinks it's releasing a little bit but still there.   PERTINENT HISTORY:  Pt states she has a bulging disc, back surgery  PAIN:  Are you having pain? Yes: NPRS scale: 7 currently/10 Pain location: L shoulder/neck Pain description: Tightness from bilat neck to shoulders, sharp pain Aggravating factors: Turning head, laying down/sleeping Relieving factors: Neck massager, neck traction  PRECAUTIONS: None  WEIGHT BEARING RESTRICTIONS: No  FALLS:  Has patient fallen in last 6 months? No  LIVING  ENVIRONMENT: Lives with: lives with their family and lives with their spouse Son Lives in: House/apartment Stairs: n/a Has following equipment at home: None  OCCUPATION: Computer work at office  PLOF: Independent  PATIENT GOALS: Improve pain  NEXT MD VISIT: 05/10/23 with Dr. Karie Schwalbe  OBJECTIVE:   DIAGNOSTIC FINDINGS:  Cervical x-ray 03/29/23 impression: Degenerative disc disease  Bulging disc  No recent injury  No sx   PATIENT SURVEYS:  NDI 48%  COGNITION: Overall cognitive status: Within functional limits for tasks assessed     SENSATION: Pt reports phalanges 1-3 has N/T at night R&L  CERVICAL ROM:   Active ROM A/PROM (deg) eval 04/06/23  Flexion 45   Extension 10*   Right lateral flexion 30   Left lateral flexion 25*   Right rotation 50 50  Left rotation 35* 50   (Blank rows = not tested)  * = concordant pain  UPPER EXTREMITY MMT:  MMT Right eval Left eval  Shoulder flexion 5 5  Shoulder extension    Shoulder abduction 5 4  Shoulder adduction    Shoulder extension 5 5  Shoulder internal rotation 5 5  Shoulder external rotation 5 5  Middle trapezius    Lower trapezius    Elbow flexion    Elbow extension    Wrist flexion    Wrist extension    Wrist ulnar deviation    Wrist radial deviation    Wrist pronation    Wrist supination    Grip strength     (Blank rows = not tested)  UPPER EXTREMITY ROM: All grossly WFL  POSTURE: rounded shoulders, forward head, and increased thoracic kyphosis  PALPATION: TTP L UT, levator scap, thoracic paraspinal, mid/low trap  CERVICAL SPECIAL TESTS:  Distraction test: Negative  FUNCTIONAL TESTS:  Did not assess  GAIT: Distance walked: 100' Assistive device utilized: None Level of assistance: Complete Independence Comments: WFL  TODAY'S TREATMENT:          [facility] Adult PT Treatment:  DATE: 04/06/23 Therapeutic Exercise: Seated  levator scap stretch x30 sec UT stretch  x30 sec Scapular retraction x10 Shoulder ER red TB 2x10 "W" red TB 2x10 Shoulder horizontal abduction red TB 2x10 Prone "I", "W", "T" x10 each S/L Open book x 5 with thoracic mobilization Standing doorway pec stretch 60 deg and 90 deg x30 sec each Manual Therapy: STM & TPR L UT, mid trap, low trap Grade II thoracic UPAs and CPAs Therapeutic Activity: Sleep positioning with pillow and towel roll Self massage with theracane                                                                                                                       DATE: 03/30/23 Manual therapy Grade II to III cervical side glide C3-4 for rotation Self massage/TPR midback musculature  Therapeutic Exercise MWM with cervical rotation Cervical ext + lateral flexion iso Levator scap stretch UT stretch S/L open/close book    PATIENT EDUCATION:  Education details: Exam findings, POC, initial HEP Person educated: Patient Education method: Explanation, Demonstration, and Handouts Education comprehension: verbalized understanding, returned demonstration, and needs further education  HOME EXERCISE PROGRAM: Access Code: [email or web address] URL: [email or web address] Date: 04/06/2023 Prepared by: Vernon Prey April Kirstie Peri  Exercises - Sidelying Thoracic Rotation with Open Book  - 1 x daily - 7 x weekly - 1 sets - 5 reps - 5 sec hold - Standing Cervical Rotation Stretch  - 1 x daily - 7 x weekly - 5 sets - 5 reps - Seated Upper Trapezius Stretch  - 1 x daily - 7 x weekly - 2 sets - 30 sec hold - Gentle Levator Scapulae Stretch  - 1 x daily - 7 x weekly - 2 sets - 30 sec hold - Shoulder External Rotation and Scapular Retraction with Resistance  - 1 x daily - 7 x weekly - 2 sets - 30 sec hold - Standing Shoulder Horizontal Abduction with Resistance  - 1 x daily - 7 x weekly - 2 sets - 10 reps - Seated Scapular Retraction  - 1 x daily - 7 x weekly - 2 sets - 10 reps - Doorway Pec Stretch at 90 Degrees  Abduction  - 1 x daily - 7 x weekly - 2 sets - 30 sec hold - Doorway Pec Stretch at 60 Elevation  - 1 x daily - 7 x weekly - 2 sets - 30 sec hold  ASSESSMENT:  CLINICAL IMPRESSION: Treatment focused on initiating postural stabilization exercises. Continued upper and mid trap tightness addressed with manual therapy. Pt educated on posture for work tasks and sleeping.   From eval: Patient is a 52 y.o. F who was seen today for physical therapy evaluation and treatment for neck/shoulder/mid back pain. Assessment significant for reduced cervical ROM due to upper cervical hypomobility -- improved with MWM techniques as well as neck/midback muscle tightness. No overt shoulder weakness noted; however, she likely has decreased posterior shoulder girdle strength. Primarily focused on cervical component of pt's discomfort. After  improving cervical ROM pt continues to feel pull into L midback area. Will try and address next session.    GOALS: Goals reviewed with patient? Yes  SHORT TERM GOALS: Target date: 04/20/2023   Pt will be ind with initial HEP Baseline: Goal status: INITIAL  2.  Pt will demo pain free and full neck ROM Baseline:  Goal status: INITIAL   LONG TERM GOALS: Target date: 05/11/2023   Pt will be ind with maintenance and progression of HEP Baseline:  Goal status: INITIAL  2.  Pt will be able to sleep throughout the night per PLOF Baseline:  Goal status: INITIAL  3.  Pt will be able to carry her purse on her L shoulder without pain for community mobility Baseline:  Goal status: INITIAL  4.  NDI will be </=38% to demo MCID Baseline:  Goal status: INITIAL  5.  Pt will report decrease in pain by >/=50% Baseline:  Goal status: INITIAL  PLAN:  PT FREQUENCY: 1x/week  PT DURATION: 6 weeks  PLANNED INTERVENTIONS: Therapeutic exercises, Therapeutic activity, Neuromuscular re-education, Balance training, Gait training, Patient/Family education, Self Care, Joint  mobilization, Aquatic Therapy, Dry Needling, Electrical stimulation, Spinal manipulation, Spinal mobilization, Cryotherapy, Moist heat, Taping, Traction, Ionotophoresis 4mg /ml Dexamethasone, Manual therapy, and Re-evaluation.  PLAN FOR NEXT SESSION: Assess response to HEP. Manual work as indicated. Work on Counsellor.    Blease Capaldi April Ma L Rokia Bosket, PT 04/06/2023, 10:18 AM

## 2023-04-12 ENCOUNTER — Encounter: Payer: Self-pay | Admitting: Physical Therapy

## 2023-04-12 ENCOUNTER — Ambulatory Visit: Payer: BC Managed Care – PPO | Admitting: Physical Therapy

## 2023-04-12 DIAGNOSIS — M542 Cervicalgia: Secondary | ICD-10-CM | POA: Diagnosis not present

## 2023-04-12 DIAGNOSIS — M546 Pain in thoracic spine: Secondary | ICD-10-CM

## 2023-04-12 DIAGNOSIS — M6281 Muscle weakness (generalized): Secondary | ICD-10-CM | POA: Diagnosis not present

## 2023-04-12 DIAGNOSIS — R293 Abnormal posture: Secondary | ICD-10-CM | POA: Diagnosis not present

## 2023-04-12 DIAGNOSIS — M5030 Other cervical disc degeneration, unspecified cervical region: Secondary | ICD-10-CM | POA: Diagnosis not present

## 2023-04-12 NOTE — Therapy (Signed)
OUTPATIENT PHYSICAL THERAPY CERVICAL TREATMENT   Patient Name: Laura Johnston MRN: [id number] DOB:10/05/1971, 52 y.o., female Today's Date: 04/12/2023  END OF SESSION:  PT End of Session - 04/12/23 1027     Visit Number 3    Number of Visits 6    Date for PT Re-Evaluation 05/11/23    Authorization Type BCBS    PT Start Time 1027    PT Stop Time 1105    PT Time Calculation (min) 38 min    Activity Tolerance Patient tolerated treatment well    Behavior During Therapy WFL for tasks assessed/performed              Past Medical History:  Diagnosis Date   Abnormal Pap smear    Allergy    mild    AMA (advanced maternal age) multigravida 35+    Arthritis    knees, back    GERD (gastroesophageal reflux disease)    Gestational diabetes    no meds - had gestational diabetes ONLY    Osteopenia    Prediabetes 01/23/2018   Vaginal delivery 08/19/2011   Past Surgical History:  Procedure Laterality Date   APPENDECTOMY     COLONOSCOPY  09/04/2020   KNEE SURGERY     LEEP     TUMOR REMOVAL     from buttocks   Patient Active Problem List   Diagnosis Date Noted   DDD (degenerative disc disease), cervical 03/29/2023   Metatarsalgia, right foot 03/29/2023   Sinobronchitis 09/29/2022   Flu-like symptoms 09/29/2022   Irritable bowel syndrome 08/04/2022   COVID-19 08/04/2022   Osteopenia 08/31/2021   Spinal cord compression, thoracic 01/03/2020   Cramp in lower extremity associated with sleep 12/04/2019   First degree ankle sprain, left, initial encounter 04/13/2018   Pulmonary scarring 01/31/2018   Class 1 obesity due to excess calories with serious comorbidity in adult 01/31/2018   Prediabetes 01/23/2018   Eye muscle twitches 01/22/2018   Palpitations 05/17/2017   Primary osteoarthritis of both knees 03/02/2017   Primary osteoarthritis of right knee 03/02/2017   Fatty liver disease, nonalcoholic 08/31/2016   Vitamin D deficiency 07/20/2016   Hyperlipidemia  03/08/2010   DIZZINESS 02/11/2010    PCP: Nani Gasser, MD  REFERRING PROVIDER: Monica Becton, MD  REFERRING DIAG: M50.30 (ICD-10-CM) - DDD (degenerative disc disease), cervical  Rationale for Evaluation and Treatment: Rehabilitation  THERAPY DIAG:  Cervicalgia  Pain in thoracic spine  Muscle weakness (generalized)  Abnormal posture  ONSET DATE: 03/27/23  SUBJECTIVE:  SUBJECTIVE STATEMENT: Pt states she's been consistent with exercises. Has been taking more breaks for work. She has been moving fine though but just feels a little pull. She notes no issues with sleeping.   From eval: Pt reports she woke up Monday with her neck stiff and hurting her. She notes pain radiated down her L arm and thoracic spine. Pt states it hurt her to press her back against a chair. She reports turning to the L she feels she pulled something in her neck. The left arm pain is starting to go down now. When she coughs or sneezes she feels it in her upper back. Pt reports she feels better throughout the day but when trying to go to bed it bothers her the most. She thinks it's releasing a little bit but still there.   PERTINENT HISTORY:  Pt states she has a bulging disc, back surgery  PAIN:  Are you having pain? Yes: NPRS scale: 7 currently/10 Pain location: L shoulder/neck Pain description: Tightness from bilat neck to shoulders, sharp pain Aggravating factors: Turning head, laying down/sleeping Relieving factors: Neck massager, neck traction  PRECAUTIONS: None  WEIGHT BEARING RESTRICTIONS: No  FALLS:  Has patient fallen in last 6 months? No  LIVING ENVIRONMENT: Lives with: lives with their family and lives with their spouse Son Lives in: House/apartment Stairs: n/a Has following equipment at  home: None  OCCUPATION: Computer work at office  PATIENT GOALS: Improve pain  NEXT MD VISIT: 05/10/23 with Dr. Karie Schwalbe  OBJECTIVE: (Measures in this section from initial evaluation unless otherwise noted)  DIAGNOSTIC FINDINGS:  Cervical x-ray 03/29/23 impression: Degenerative disc disease  Bulging disc  No recent injury  No sx   PATIENT SURVEYS:  NDI 48%   SENSATION: Pt reports phalanges 1-3 has N/T at night R&L  CERVICAL ROM:   Active ROM A/PROM (deg) eval 04/06/23  Flexion 45   Extension 10*   Right lateral flexion 30   Left lateral flexion 25*   Right rotation 50 50  Left rotation 35* 50   (Blank rows = not tested)  * = concordant pain  UPPER EXTREMITY MMT:  MMT Right eval Left eval  Shoulder flexion 5 5  Shoulder extension    Shoulder abduction 5 4  Shoulder adduction    Shoulder extension 5 5  Shoulder internal rotation 5 5  Shoulder external rotation 5 5  Middle trapezius    Lower trapezius    Elbow flexion    Elbow extension    Wrist flexion    Wrist extension    Wrist ulnar deviation    Wrist radial deviation    Wrist pronation    Wrist supination    Grip strength     (Blank rows = not tested)  UPPER EXTREMITY ROM: All grossly WFL  POSTURE: rounded shoulders, forward head, and increased thoracic kyphosis  PALPATION: TTP L UT, levator scap, thoracic paraspinal, mid/low trap  CERVICAL SPECIAL TESTS:  Distraction test: Negative  FUNCTIONAL TESTS:  Did not assess  GAIT: Distance walked: 100' Assistive device utilized: None Level of assistance: Complete Independence Comments: [facility]  [facility] Adult PT Treatment:                                                DATE: 04/12/23 Therapeutic Exercise: Seated Cervical rotation x5 UT stretch x20 sec "W" 2x10 Scap squeeze  2x10 Standing Row green TB 2x10 Shoulder ext green TB 2x10 Manual Therapy: Deep TPR and STM posterior shoulder girdle/mid back, thoracic paraspinals -- focus primarily on low/mid trap  (can feel it pulling/radiating to upper shoulder and neck) Thoracic UPAs Scapular mobilizations Skilled assessment and palpation for TPDN Trigger Point Dry-Needling  Treatment instructions: Expect mild to moderate muscle soreness. S/S of pneumothorax if dry needled over a lung field, and to seek immediate medical attention should they occur. Patient verbalized understanding of these instructions and education.  Patient Consent Given: Yes Education handout provided: Previously provided Muscles treated: L low/mid trap Electrical stimulation performed: No Parameters: N/A Treatment response/outcome: increase in muscle length      [facility] Adult PT Treatment:                                                DATE: 04/06/23 Therapeutic Exercise: Seated  levator scap stretch x30 sec UT stretch x30 sec Scapular retraction x10 Shoulder ER red TB 2x10 "W" red TB 2x10 Shoulder horizontal abduction red TB 2x10 Prone "I", "W", "T" x10 each S/L Open book x 5 with thoracic mobilization Standing doorway pec stretch 60 deg and 90 deg x30 sec each Manual Therapy: STM & TPR L UT, mid trap, low trap Grade II thoracic UPAs and CPAs Therapeutic Activity: Sleep positioning with pillow and towel roll Self massage with theracane                                                                                                                       DATE: 03/30/23 Manual therapy Grade II to III cervical side glide C3-4 for rotation Self massage/TPR midback musculature  Therapeutic Exercise MWM with cervical rotation Cervical ext + lateral flexion iso Levator scap stretch UT stretch S/L open/close book    PATIENT EDUCATION:  Education details: Exam findings, POC, initial HEP Person educated: Patient Education method: Explanation, Demonstration, and Handouts Education comprehension: verbalized understanding, returned demonstration, and needs further education  HOME EXERCISE PROGRAM: Access Code:  [email or web address] URL: [email or web address] Date: 04/06/2023 Prepared by: Vernon Prey April Kirstie Peri  Exercises - Sidelying Thoracic Rotation with Open Book  - 1 x daily - 7 x weekly - 1 sets - 5 reps - 5 sec hold - Standing Cervical Rotation Stretch  - 1 x daily - 7 x weekly - 5 sets - 5 reps - Seated Upper Trapezius Stretch  - 1 x daily - 7 x weekly - 2 sets - 30 sec hold - Gentle Levator Scapulae Stretch  - 1 x daily - 7 x weekly - 2 sets - 30 sec hold - Shoulder External Rotation and Scapular Retraction with Resistance  - 1 x daily - 7 x weekly - 2 sets - 30 sec hold - Standing Shoulder Horizontal Abduction with Resistance  - 1 x daily - 7 x weekly - 2  sets - 10 reps - Seated Scapular Retraction  - 1 x daily - 7 x weekly - 2 sets - 10 reps - Doorway Pec Stretch at 90 Degrees Abduction  - 1 x daily - 7 x weekly - 2 sets - 30 sec hold - Doorway Pec Stretch at 60 Elevation  - 1 x daily - 7 x weekly - 2 sets - 30 sec hold  ASSESSMENT:  CLINICAL IMPRESSION: Continued to work on postural stabilization. Pt with less pain but continued tightness in midback when rotating neck. Addressed with manual therapy, stretching, and trial of TPDN this session. No pulling sensation noted with neck movement by end of treatment; however, was very sore/tender in midback. Discussed with pt continuing self massage with ball on this spot to continue to work out the tightness if she starts feeling the pulling again.   From eval: Patient is a 52 y.o. F who was seen today for physical therapy evaluation and treatment for neck/shoulder/mid back pain. Assessment significant for reduced cervical ROM due to upper cervical hypomobility -- improved with MWM techniques as well as neck/midback muscle tightness. No overt shoulder weakness noted; however, she likely has decreased posterior shoulder girdle strength. Primarily focused on cervical component of pt's discomfort. After improving cervical ROM pt continues to feel  pull into L midback area. Will try and address next session.    GOALS: Goals reviewed with patient? Yes  SHORT TERM GOALS: Target date: 04/20/2023   Pt will be ind with initial HEP Baseline: Goal status: MET  2.  Pt will demo pain free and full neck ROM Baseline:  (04/12/23) No pain but pulling in midback, has full range Goal status: MET    LONG TERM GOALS: Target date: 05/11/2023   Pt will be ind with maintenance and progression of HEP Baseline:  Goal status: INITIAL  2.  Pt will be able to sleep throughout the night per PLOF Baseline:  Goal status: MET 04/12/23  3.  Pt will be able to carry her purse on her L shoulder without pain for community mobility Baseline:  Goal status: INITIAL  4.  NDI will be </=38% to demo MCID Baseline:  Goal status: INITIAL  5.  Pt will report decrease in pain by >/=50% Baseline:  Goal status: INITIAL  PLAN:  PT FREQUENCY: 1x/week  PT DURATION: 6 weeks  PLANNED INTERVENTIONS: Therapeutic exercises, Therapeutic activity, Neuromuscular re-education, Balance training, Gait training, Patient/Family education, Self Care, Joint mobilization, Aquatic Therapy, Dry Needling, Electrical stimulation, Spinal manipulation, Spinal mobilization, Cryotherapy, Moist heat, Taping, Traction, Ionotophoresis 4mg /ml Dexamethasone, Manual therapy, and Re-evaluation.  PLAN FOR NEXT SESSION: Assess response to HEP. Manual work as indicated. Work on Counsellor.    Chavy Avera April Ma L Ott Zimmerle, PT 04/12/2023, 11:17 AM

## 2023-04-26 ENCOUNTER — Encounter: Payer: Self-pay | Admitting: Physical Therapy

## 2023-04-26 ENCOUNTER — Ambulatory Visit: Payer: BC Managed Care – PPO | Admitting: Physical Therapy

## 2023-04-26 DIAGNOSIS — M6281 Muscle weakness (generalized): Secondary | ICD-10-CM

## 2023-04-26 DIAGNOSIS — M546 Pain in thoracic spine: Secondary | ICD-10-CM | POA: Diagnosis not present

## 2023-04-26 DIAGNOSIS — M542 Cervicalgia: Secondary | ICD-10-CM | POA: Diagnosis not present

## 2023-04-26 DIAGNOSIS — R293 Abnormal posture: Secondary | ICD-10-CM | POA: Diagnosis not present

## 2023-04-26 DIAGNOSIS — M5030 Other cervical disc degeneration, unspecified cervical region: Secondary | ICD-10-CM | POA: Diagnosis not present

## 2023-04-26 NOTE — Therapy (Unsigned)
OUTPATIENT PHYSICAL THERAPY CERVICAL TREATMENT   Patient Name: Laura Johnston MRN: [id number] DOB:02-19-1971, 52 y.o., female Today's Date: 04/26/2023  END OF SESSION:  PT End of Session - 04/26/23 1449     Visit Number 4    Number of Visits 6    Date for PT Re-Evaluation 05/11/23    Authorization Type [employer]BS    PT Start Time 1449    PT Stop Time 1530    PT Time Calculation (min) 41 min    Activity Tolerance Patient tolerated treatment well    Behavior During Therapy WFL for tasks assessed/performed              Past Medical History:  Diagnosis Date   Abnormal Pap smear    Allergy    mild    AMA (advanced maternal age) multigravida 35+    Arthritis    knees, back    GERD (gastroesophageal reflux disease)    Gestational diabetes    no meds - had gestational diabetes ONLY    Osteopenia    Prediabetes 01/23/2018   Vaginal delivery 08/19/2011   Past Surgical History:  Procedure Laterality Date   APPENDECTOMY     COLONOSCOPY  09/04/2020   KNEE SURGERY     LEEP     TUMOR REMOVAL     from buttocks   Patient Active Problem List   Diagnosis Date Noted   DDD (degenerative disc disease), cervical 03/29/2023   Metatarsalgia, right foot 03/29/2023   Sinobronchitis 09/29/2022   Flu-like symptoms 09/29/2022   Irritable bowel syndrome 08/04/2022   COVID-19 08/04/2022   Osteopenia 08/31/2021   Spinal cord compression, thoracic 01/03/2020   Cramp in lower extremity associated with sleep 12/04/2019   First degree ankle sprain, left, initial encounter 04/13/2018   Pulmonary scarring 01/31/2018   Class 1 obesity due to excess calories with serious comorbidity in adult 01/31/2018   Prediabetes 01/23/2018   Eye muscle twitches 01/22/2018   Palpitations 05/17/2017   Primary osteoarthritis of both knees 03/02/2017   Primary osteoarthritis of right knee 03/02/2017   Fatty liver disease, nonalcoholic 08/31/2016   Vitamin D deficiency 07/20/2016   Hyperlipidemia  03/08/2010   DIZZINESS 02/11/2010    PCP: Nani Gasser, MD  REFERRING PROVIDER: Monica Becton, MD  REFERRING DIAG: M50.30 (ICD-10-CM) - DDD (degenerative disc disease), cervical  Rationale for Evaluation and Treatment: Rehabilitation  THERAPY DIAG:  Cervicalgia  Pain in thoracic spine  Muscle weakness (generalized)  Abnormal posture  ONSET DATE: 03/27/23  SUBJECTIVE:  SUBJECTIVE STATEMENT: Pt states she has been trying to find the right width to support her neck in sleep. Pt reports she has been doing good otherwise. Pt notes she will wake up with some stiffness but thinks it's just her pillow and sometimes has a hard time find the right sleep position. The stiffness  From eval: Pt reports she woke up Monday with her neck stiff and hurting her. She notes pain radiated down her L arm and thoracic spine. Pt states it hurt her to press her back against a chair. She reports turning to the L she feels she pulled something in her neck. The left arm pain is starting to go down now. When she coughs or sneezes she feels it in her upper back. Pt reports she feels better throughout the day but when trying to go to bed it bothers her the most. She thinks it's releasing a little bit but still there.   PERTINENT HISTORY:  Pt states she has a bulging disc, back surgery  PAIN:  Are you having pain? Yes: NPRS scale: 7 currently/10 Pain location: L shoulder/neck Pain description: Tightness from bilat neck to shoulders, sharp pain Aggravating factors: Turning head, laying down/sleeping Relieving factors: Neck massager, neck traction  PRECAUTIONS: None  WEIGHT BEARING RESTRICTIONS: No  FALLS:  Has patient fallen in last 6 months? No  LIVING ENVIRONMENT: Lives with: lives with their family and  lives with their spouse Son Lives in: House/apartment Stairs: n/a Has following equipment at home: None  OCCUPATION: Computer work at office  PATIENT GOALS: Improve pain  NEXT MD VISIT: 05/10/23 with Dr. Karie Schwalbe  OBJECTIVE: (Measures in this section from initial evaluation unless otherwise noted)  DIAGNOSTIC FINDINGS:  Cervical x-ray 03/29/23 impression: Degenerative disc disease  Bulging disc  No recent injury  No sx   PATIENT SURVEYS:  NDI 48%   SENSATION: Pt reports phalanges 1-3 has N/T at night R&L  CERVICAL ROM:   Active ROM A/PROM (deg) eval 04/06/23  Flexion 45   Extension 10*   Right lateral flexion 30   Left lateral flexion 25*   Right rotation 50 50  Left rotation 35* 50   (Blank rows = not tested)  * = concordant pain  UPPER EXTREMITY MMT:  MMT Right eval Left eval  Shoulder flexion 5 5  Shoulder extension    Shoulder abduction 5 4  Shoulder adduction    Shoulder extension 5 5  Shoulder internal rotation 5 5  Shoulder external rotation 5 5  Middle trapezius    Lower trapezius    Elbow flexion    Elbow extension    Wrist flexion    Wrist extension    Wrist ulnar deviation    Wrist radial deviation    Wrist pronation    Wrist supination    Grip strength     (Blank rows = not tested)  UPPER EXTREMITY ROM: All grossly WFL  POSTURE: rounded shoulders, forward head, and increased thoracic kyphosis  PALPATION: TTP L UT, levator scap, thoracic paraspinal, mid/low trap  CERVICAL SPECIAL TESTS:  Distraction test: Negative  FUNCTIONAL TESTS:  Did not assess  GAIT: Distance walked: 100' Assistive device utilized: None Level of assistance: Complete Independence Comments: WFL  [facility] Adult PT Treatment:  DATE: 04/26/23 Therapeutic Exercise: Standing Row green TB 2x10 Palloff press green TB 2x10 Shoulder ext green reactive iso TB 10x5 sec Bow and arrow green TB 2x10x5 sec Abset on pball 10x5 sec  each, straight down and then diagonals Self Care: Reviewed sleep position and neck/head support   [facility] Adult PT Treatment:                                                DATE: 04/12/23 Therapeutic Exercise: Seated Cervical rotation x5 UT stretch x20 sec "W" 2x10 Scap squeeze 2x10 Standing Row green TB 2x10 Shoulder ext green TB 2x10 Manual Therapy: Deep TPR and STM posterior shoulder girdle/mid back, thoracic paraspinals -- focus primarily on low/mid trap (can feel it pulling/radiating to upper shoulder and neck) Thoracic UPAs Scapular mobilizations Skilled assessment and palpation for TPDN Trigger Point Dry-Needling  Treatment instructions: Expect mild to moderate muscle soreness. S/S of pneumothorax if dry needled over a lung field, and to seek immediate medical attention should they occur. Patient verbalized understanding of these instructions and education.  Patient Consent Given: Yes Education handout provided: Previously provided Muscles treated: L low/mid trap Electrical stimulation performed: No Parameters: N/A Treatment response/outcome: increase in muscle length      [facility] Adult PT Treatment:                                                DATE: 04/06/23 Therapeutic Exercise: Seated  levator scap stretch x30 sec UT stretch x30 sec Scapular retraction x10 Shoulder ER red TB 2x10 "W" red TB 2x10 Shoulder horizontal abduction red TB 2x10 Prone "I", "W", "T" x10 each S/L Open book x 5 with thoracic mobilization Standing doorway pec stretch 60 deg and 90 deg x30 sec each Manual Therapy: STM & TPR L UT, mid trap, low trap Grade II thoracic UPAs and CPAs Therapeutic Activity: Sleep positioning with pillow and towel roll Self massage with theracane                                                                                                                       DATE: 03/30/23 Manual therapy Grade II to III cervical side glide C3-4 for rotation Self massage/TPR  midback musculature  Therapeutic Exercise MWM with cervical rotation Cervical ext + lateral flexion iso Levator scap stretch UT stretch S/L open/close book    PATIENT EDUCATION:  Education details: Exam findings, POC, initial HEP Person educated: Patient Education method: Explanation, Demonstration, and Handouts Education comprehension: verbalized understanding, returned demonstration, and needs further education  HOME EXERCISE PROGRAM: Access Code: [email or web address] URL: [email or web address] Date: 04/06/2023 Prepared by: Vernon Prey April Kirstie Peri  Exercises - Sidelying Thoracic Rotation with Open Book  - 1 x daily - 7  x weekly - 1 sets - 5 reps - 5 sec hold - Standing Cervical Rotation Stretch  - 1 x daily - 7 x weekly - 5 sets - 5 reps - Seated Upper Trapezius Stretch  - 1 x daily - 7 x weekly - 2 sets - 30 sec hold - Gentle Levator Scapulae Stretch  - 1 x daily - 7 x weekly - 2 sets - 30 sec hold - Shoulder External Rotation and Scapular Retraction with Resistance  - 1 x daily - 7 x weekly - 2 sets - 30 sec hold - Standing Shoulder Horizontal Abduction with Resistance  - 1 x daily - 7 x weekly - 2 sets - 10 reps - Seated Scapular Retraction  - 1 x daily - 7 x weekly - 2 sets - 10 reps - Doorway Pec Stretch at 90 Degrees Abduction  - 1 x daily - 7 x weekly - 2 sets - 30 sec hold - Doorway Pec Stretch at 60 Elevation  - 1 x daily - 7 x weekly - 2 sets - 30 sec hold  ASSESSMENT:  CLINICAL IMPRESSION: Continued to work on postural stabilization. Pt with less pain but continued tightness in midback when rotating neck. Addressed with manual therapy, stretching, and trial of TPDN this session. No pulling sensation noted with neck movement by end of treatment; however, was very sore/tender in midback. Discussed with pt continuing self massage with ball on this spot to continue to work out the tightness if she starts feeling the pulling again.   From eval: Patient is a 52 y.o. F  who was seen today for physical therapy evaluation and treatment for neck/shoulder/mid back pain. Assessment significant for reduced cervical ROM due to upper cervical hypomobility -- improved with MWM techniques as well as neck/midback muscle tightness. No overt shoulder weakness noted; however, she likely has decreased posterior shoulder girdle strength. Primarily focused on cervical component of pt's discomfort. After improving cervical ROM pt continues to feel pull into L midback area. Will try and address next session.    GOALS: Goals reviewed with patient? Yes  SHORT TERM GOALS: Target date: 04/20/2023   Pt will be ind with initial HEP Baseline: Goal status: MET  2.  Pt will demo pain free and full neck ROM Baseline:  (04/12/23) No pain but pulling in midback, has full range Goal status: MET    LONG TERM GOALS: Target date: 05/11/2023   Pt will be ind with maintenance and progression of HEP Baseline:  Goal status: MET  2.  Pt will be able to sleep throughout the night per PLOF Baseline:  Goal status: MET 04/12/23  3.  Pt will be able to carry her purse on her L shoulder without pain for community mobility Baseline:  Goal status: MET  4.  NDI will be </=38% to demo MCID Baseline:  04/26/23: 12% Goal status: MET  5.  Pt will report decrease in pain by >/=50% Baseline:  04/26/23: Reports 90% decrease -- only bothered with sleep Goal status: MET  PLAN:  PT FREQUENCY: 1x/week  PT DURATION: 6 weeks  PLANNED INTERVENTIONS: Therapeutic exercises, Therapeutic activity, Neuromuscular re-education, Balance training, Gait training, Patient/Family education, Self Care, Joint mobilization, Aquatic Therapy, Dry Needling, Electrical stimulation, Spinal manipulation, Spinal mobilization, Cryotherapy, Moist heat, Taping, Traction, Ionotophoresis 4mg /ml Dexamethasone, Manual therapy, and Re-evaluation.  PLAN FOR NEXT SESSION: Assess response to HEP. Manual work as indicated. Work on  Counsellor.    Katlen Seyer April Ma L Ryland Smoots, PT 04/26/2023,  2:49 PM

## 2023-05-10 ENCOUNTER — Ambulatory Visit: Payer: BC Managed Care – PPO | Admitting: Sports Medicine

## 2023-06-09 DIAGNOSIS — E559 Vitamin D deficiency, unspecified: Secondary | ICD-10-CM | POA: Diagnosis not present

## 2023-06-09 DIAGNOSIS — M8589 Other specified disorders of bone density and structure, multiple sites: Secondary | ICD-10-CM | POA: Diagnosis not present

## 2023-06-23 ENCOUNTER — Ambulatory Visit (INDEPENDENT_AMBULATORY_CARE_PROVIDER_SITE_OTHER): Payer: BC Managed Care – PPO | Admitting: Family Medicine

## 2023-06-23 VITALS — BP 134/84 | HR 87 | Temp 98.4°F | Ht 64.0 in | Wt 190.0 lb

## 2023-06-23 DIAGNOSIS — J069 Acute upper respiratory infection, unspecified: Secondary | ICD-10-CM | POA: Diagnosis not present

## 2023-06-23 DIAGNOSIS — R051 Acute cough: Secondary | ICD-10-CM

## 2023-06-23 DIAGNOSIS — R0981 Nasal congestion: Secondary | ICD-10-CM | POA: Diagnosis not present

## 2023-06-23 LAB — POCT INFLUENZA A/B
Influenza A, POC: NEGATIVE
Influenza B, POC: NEGATIVE

## 2023-06-23 LAB — POC COVID19 BINAXNOW: SARS Coronavirus 2 Ag: NEGATIVE

## 2023-06-23 MED ORDER — HYDROCODONE BIT-HOMATROP MBR 5-1.5 MG/5ML PO SOLN: 5.0000 mL | Freq: Every evening | ORAL | 0 refills | Status: DC | PRN

## 2023-06-23 NOTE — Progress Notes (Signed)
Acute Office Visit  Subjective:     Patient ID: Laura Johnston, female    DOB: 1971-01-09, 52 y.o.   MRN: [id number]  Chief Complaint  Patient presents with   Cough   Nasal Congestion    HPI Patient is in today for upper respiratory symptoms.  Started with a runny nose about 3 days ago and then developed into a cough.  No fevers chills or bodyaches.  The cough has been pretty persistent she is now starting to get a sore throat.  She did have 2 episodes of diarrhea yesterday but feels better today.  She was initially taking loratadine to help with the runny nose and then switch to Robitussin today.  ROS      Objective:    BP 134/84   Pulse 87   Temp 98.4 F (36.9 C)   Ht 5\' 4"  (1.626 m)   Wt 190 lb (86.2 kg)   LMP 10/25/2011   SpO2 98%   BMI 32.61 kg/m    Physical Exam Constitutional:      Appearance: She is well-developed.  HENT:     Head: Normocephalic and atraumatic.     Right Ear: External ear normal.     Left Ear: External ear normal.     Nose: Nose normal.  Eyes:     Conjunctiva/sclera: Conjunctivae normal.     Pupils: Pupils are equal, round, and reactive to light.  Neck:     Thyroid: No thyromegaly.  Cardiovascular:     Rate and Rhythm: Normal rate and regular rhythm.     Heart sounds: Normal heart sounds.  Pulmonary:     Effort: Pulmonary effort is normal.     Breath sounds: Normal breath sounds. No wheezing.  Musculoskeletal:     Cervical back: Neck supple.  Lymphadenopathy:     Cervical: No cervical adenopathy.  Skin:    General: Skin is warm and dry.  Neurological:     Mental Status: She is alert and oriented to person, place, and time.     Results for orders placed or performed in visit on 06/23/23  POC COVID-19  Result Value Ref Range   SARS Coronavirus 2 Ag Negative Negative  POCT Influenza A/B  Result Value Ref Range   Influenza A, POC Negative Negative   Influenza B, POC Negative Negative        Assessment & Plan:    Problem List Items Addressed This Visit   None Visit Diagnoses     Acute cough    -  Primary   Relevant Orders   POC COVID-19 (Completed)   POCT Influenza A/B (Completed)   Nasal congestion       Relevant Orders   POC COVID-19 (Completed)   POCT Influenza A/B (Completed)   Viral upper respiratory tract infection          Upper respiratory infection-most consistent with viral illness.  Recommend symptomatic care.  Okay to continue the Robitussin during the day and will send over cough syrup for bedtime.  If not feeling some better after the weekend please let us know if develops new or worsening symptoms please let us know sooner rather than later.  Negative for flu and negative for COVID.  Meds ordered this encounter  Medications   HYDROcodone bit-homatropine (HYCODAN) 5-1.5 MG/5ML syrup    Sig: Take 5-10 mLs by mouth at bedtime as needed for cough.    Dispense:  70 mL    Refill:  0    Return  if symptoms worsen or fail to improve.  Nani Gasser, MD

## 2023-08-12 ENCOUNTER — Encounter: Payer: Self-pay | Admitting: Family Medicine

## 2023-08-14 MED ORDER — MAG-OXIDE 200 MG PO TABS: 1.0000 | ORAL_TABLET | Freq: Every day | ORAL | 1 refills | Status: DC

## 2023-08-14 NOTE — Telephone Encounter (Signed)
Meds ordered this encounter  Medications   Magnesium Oxide -Mg Supplement (MAG-OXIDE) 200 MG TABS    Sig: Take 1 tablet (200 mg total) by mouth daily.    Dispense:  90 tablet    Refill:  1

## 2023-08-17 NOTE — Telephone Encounter (Signed)
OK to take 400mg  daily.

## 2023-08-17 NOTE — Telephone Encounter (Signed)
Patient informed. 

## 2023-08-23 DIAGNOSIS — R079 Chest pain, unspecified: Secondary | ICD-10-CM | POA: Diagnosis not present

## 2023-08-23 DIAGNOSIS — J329 Chronic sinusitis, unspecified: Secondary | ICD-10-CM | POA: Diagnosis not present

## 2023-08-23 DIAGNOSIS — E7800 Pure hypercholesterolemia, unspecified: Secondary | ICD-10-CM | POA: Diagnosis not present

## 2023-08-23 DIAGNOSIS — R519 Headache, unspecified: Secondary | ICD-10-CM | POA: Diagnosis not present

## 2023-08-23 DIAGNOSIS — Z9089 Acquired absence of other organs: Secondary | ICD-10-CM | POA: Diagnosis not present

## 2023-08-23 DIAGNOSIS — Z87892 Personal history of anaphylaxis: Secondary | ICD-10-CM | POA: Diagnosis not present

## 2023-08-23 DIAGNOSIS — J3489 Other specified disorders of nose and nasal sinuses: Secondary | ICD-10-CM | POA: Diagnosis not present

## 2023-08-23 DIAGNOSIS — G43019 Migraine without aura, intractable, without status migrainosus: Secondary | ICD-10-CM | POA: Diagnosis not present

## 2023-08-23 DIAGNOSIS — R42 Dizziness and giddiness: Secondary | ICD-10-CM | POA: Diagnosis not present

## 2023-08-23 DIAGNOSIS — Z888 Allergy status to other drugs, medicaments and biological substances status: Secondary | ICD-10-CM | POA: Diagnosis not present

## 2023-08-23 DIAGNOSIS — Z87891 Personal history of nicotine dependence: Secondary | ICD-10-CM | POA: Diagnosis not present

## 2023-09-07 ENCOUNTER — Ambulatory Visit: Payer: BC Managed Care – PPO | Admitting: Family Medicine

## 2023-09-07 ENCOUNTER — Encounter: Payer: Self-pay | Admitting: Family Medicine

## 2023-09-07 VITALS — BP 107/75 | HR 90 | Ht 64.0 in | Wt 190.0 lb

## 2023-09-07 DIAGNOSIS — J3489 Other specified disorders of nose and nasal sinuses: Secondary | ICD-10-CM

## 2023-09-07 DIAGNOSIS — R7309 Other abnormal glucose: Secondary | ICD-10-CM

## 2023-09-07 DIAGNOSIS — H8111 Benign paroxysmal vertigo, right ear: Secondary | ICD-10-CM | POA: Diagnosis not present

## 2023-09-07 LAB — POCT GLYCOSYLATED HEMOGLOBIN (HGB A1C): Hemoglobin A1C: 5.3 % (ref 4.0–5.6)

## 2023-09-07 MED ORDER — FLUTICASONE PROPIONATE 50 MCG/ACT NA SUSP
2.0000 | Freq: Every day | NASAL | 1 refills | Status: AC
Start: 2023-09-07 — End: ?

## 2023-09-07 NOTE — Patient Instructions (Signed)
Let us know if the dizziness is not improving over the next 10 days.  If not then we can refer you to vestibular rehab and have them help you.

## 2023-09-07 NOTE — Progress Notes (Signed)
Established Patient Office Visit  Subjective   Patient ID: Laura Johnston, female    DOB: 09-27-1971  Age: 52 y.o. MRN: [id number]  Chief Complaint  Patient presents with   Follow-up           HPI  She was seen on 9/25 in the emergency room for a migraine, dizziness and abdominal pressure. .  Per the ED report the headache and dizziness had started the night before.  She checked her blood pressure and it was a little elevated.  Because of the upper abdominal pain that she was also experiencing they did do cardiac enzymes which were negative.  About panel was normal except ALT was mildly elevated at 46.  CBC was normal.  D-dimer was negative.  Head CT was negative.  Chest x-ray was normal.  EKG just showed some nonspecific ST wave changes and borderline prolonged QTc.  She says since then she has just noticed that she starts to feel bad in the middle the day she wakes up feeling fine but then around 1230 or 1 starts to feel dizzy.  She said for example yesterday around 1230 she suddenly felt like the blood was "running" down her legs and through her body.  She started to feel little nauseated and a little bit dizzy.  She remembers trying to walk to the break room to get food thinking that maybe she just needed to eat and she felt dizzy.  She had some cystotomy with beans and chicken and then about 40 minutes later tried to get up to do some dishes and actually felt like things were moving so she just leaned against the counter and finished washing the dishes.  Afterwards she checked her blood pressure at work and it was in the 140s which is high for her.  She checked her blood sugar when she got home about 3 hours later and it was around 145.     ROS    Objective:     BP 107/75   Pulse 90   Ht 5\' 4"  (1.626 m)   Wt 190 lb (86.2 kg)   LMP 10/25/2011   SpO2 98%   BMI 32.61 kg/m     Physical Exam Vitals and nursing note reviewed.  Constitutional:      Appearance: Normal  appearance.  HENT:     Head: Normocephalic and atraumatic.     Right Ear: Tympanic membrane, ear canal and external ear normal. There is no impacted cerumen.     Left Ear: Tympanic membrane, ear canal and external ear normal. There is no impacted cerumen.     Nose: Nose normal.     Mouth/Throat:     Pharynx: Oropharynx is clear.  Eyes:     Conjunctiva/sclera: Conjunctivae normal.  Cardiovascular:     Rate and Rhythm: Normal rate and regular rhythm.  Pulmonary:     Effort: Pulmonary effort is normal.     Breath sounds: Normal breath sounds.  Musculoskeletal:     Cervical back: Neck supple. No tenderness.  Lymphadenopathy:     Cervical: No cervical adenopathy.  Skin:    General: Skin is warm and dry.  Neurological:     Mental Status: She is alert and oriented to person, place, and time.     Comments: Do a Dix-Hallpike maneuver.  And she did have nystagmus when she started to sit up from the right sided position.  Psychiatric:        Mood and Affect: Mood normal.  Results for orders placed or performed in visit on 09/07/23  POCT HgB A1C  Result Value Ref Range   Hemoglobin A1C 5.3 4.0 - 5.6 %   HbA1c POC (<> result, manual entry)     HbA1c, POC (prediabetic range)     HbA1c, POC (controlled diabetic range)         The 10-year ASCVD risk score (Arnett DK, et al., 2019) is: 1.1%    Assessment & Plan:   Problem List Items Addressed This Visit   None Visit Diagnoses     BPPV (benign paroxysmal positional vertigo), right    -  Primary   Sinus pressure       Relevant Medications   fluticasone (FLONASE) 50 MCG/ACT nasal spray   Abnormal glucose       Relevant Orders   POCT HgB A1C (Completed)       BPPV, right-given handout with exercises to do on her own at home discussed diagnosis and how to improve her symptoms.  Sinus pain and pressure - Having a lot of pressure in her maxillary sinuses and temples and CT did indicate some swelling in the sinuses.  I think  she would be a great candidate for nasal steroid spray.  Prescription sent to pharmacy.  If at any point she notices that she feels like her sinuses are getting worse or she is noticing thick nasal discharge to please let us know.  Evaded blood pressures-today her blood pressure looks absolutely phenomenal so just encouraged her to keep an eye on it over the next couple of weeks make sure that she is limiting salt intake.  I suspect the high pressure was probably in part because she was not feeling well and in the emergency room probably some component of just anxiety about the situation that might of shot her pressure up.  But again it looks great today.  And she does have a home cuff to be able to measure.  Elevated BP - Her home blood pressure cuff that she brought in her room with today measured 129/91.  So our measures are much better.Her home cuff is running higher.   Return if symptoms worsen or fail to improve.    Nani Gasser, MD

## 2023-09-08 DIAGNOSIS — M8589 Other specified disorders of bone density and structure, multiple sites: Secondary | ICD-10-CM | POA: Diagnosis not present

## 2023-09-08 DIAGNOSIS — E559 Vitamin D deficiency, unspecified: Secondary | ICD-10-CM | POA: Diagnosis not present

## 2023-09-12 ENCOUNTER — Encounter: Payer: Self-pay | Admitting: Family Medicine

## 2023-09-12 NOTE — Telephone Encounter (Signed)
Ok for work note for this week and next week to be able to work from home bc of vertigo

## 2023-09-15 DIAGNOSIS — E559 Vitamin D deficiency, unspecified: Secondary | ICD-10-CM | POA: Diagnosis not present

## 2023-09-15 DIAGNOSIS — M8589 Other specified disorders of bone density and structure, multiple sites: Secondary | ICD-10-CM | POA: Diagnosis not present

## 2023-09-25 DIAGNOSIS — H5203 Hypermetropia, bilateral: Secondary | ICD-10-CM | POA: Diagnosis not present

## 2023-09-30 ENCOUNTER — Other Ambulatory Visit: Payer: Self-pay | Admitting: Family Medicine

## 2023-09-30 DIAGNOSIS — J3489 Other specified disorders of nose and nasal sinuses: Secondary | ICD-10-CM

## 2023-12-05 DIAGNOSIS — R252 Cramp and spasm: Secondary | ICD-10-CM | POA: Diagnosis not present

## 2023-12-05 DIAGNOSIS — Z6834 Body mass index (BMI) 34.0-34.9, adult: Secondary | ICD-10-CM | POA: Diagnosis not present

## 2023-12-05 DIAGNOSIS — Z131 Encounter for screening for diabetes mellitus: Secondary | ICD-10-CM | POA: Diagnosis not present

## 2023-12-05 DIAGNOSIS — Z124 Encounter for screening for malignant neoplasm of cervix: Secondary | ICD-10-CM | POA: Diagnosis not present

## 2023-12-05 DIAGNOSIS — E559 Vitamin D deficiency, unspecified: Secondary | ICD-10-CM | POA: Diagnosis not present

## 2023-12-05 DIAGNOSIS — Z01419 Encounter for gynecological examination (general) (routine) without abnormal findings: Secondary | ICD-10-CM | POA: Diagnosis not present

## 2023-12-05 DIAGNOSIS — R635 Abnormal weight gain: Secondary | ICD-10-CM | POA: Diagnosis not present

## 2023-12-05 LAB — HM MAMMOGRAPHY

## 2023-12-07 LAB — HM PAP SMEAR

## 2024-03-15 DIAGNOSIS — E559 Vitamin D deficiency, unspecified: Secondary | ICD-10-CM | POA: Diagnosis not present

## 2024-03-15 DIAGNOSIS — M8589 Other specified disorders of bone density and structure, multiple sites: Secondary | ICD-10-CM | POA: Diagnosis not present

## 2024-05-28 ENCOUNTER — Encounter: Payer: Self-pay | Admitting: Family Medicine

## 2024-05-28 ENCOUNTER — Ambulatory Visit (INDEPENDENT_AMBULATORY_CARE_PROVIDER_SITE_OTHER): Admitting: Family Medicine

## 2024-05-28 VITALS — BP 123/83 | HR 93 | Ht 64.0 in | Wt 198.0 lb

## 2024-05-28 DIAGNOSIS — Z0000 Encounter for general adult medical examination without abnormal findings: Secondary | ICD-10-CM | POA: Diagnosis not present

## 2024-05-28 DIAGNOSIS — R252 Cramp and spasm: Secondary | ICD-10-CM

## 2024-05-28 DIAGNOSIS — R7309 Other abnormal glucose: Secondary | ICD-10-CM

## 2024-05-28 NOTE — Progress Notes (Signed)
 Complete physical exam  Patient: Susa Bones   DOB: 1971/02/15   53 y.o. Female  MRN: [id number]  Subjective:    Chief Complaint  Patient presents with   Annual Exam    Emyah Roznowski is a 53 y.o. female who presents today for a complete physical exam. She reports consuming a general diet. The patient does not participate in regular exercise at present. She generally feels well. She reports sleeping poorly. She does not have additional problems to discuss today.   Sees Dr. Tomlin for GYN care her mammogram is up-to-date and she had a DEXA scan in 2024.  Recently saw endocrinology and had a metabolic panel and vitamin D  checked.  Vitamin D  was still low but she does have the 50,000 unit at home and has about 12 tabs left which would last about 12 weeks.  Having cramping in calves about 3 x per week for several months.   Most recent fall risk assessment:    05/28/2024    8:10 AM  Fall Risk   Falls in the past year? 0  Number falls in past yr: 0  Injury with Fall? 0  Risk for fall due to : No Fall Risks  Follow up Falls evaluation completed     Most recent depression screenings:    05/28/2024    8:09 AM 06/23/2023    3:13 PM  PHQ 2/9 Scores  PHQ - 2 Score 0 0         Patient Care Team: Alvan Dorothyann BIRCH, MD as PCP - General Curlene Agent, MD as Consulting Physician (Obstetrics and Gynecology)   Outpatient Medications Prior to Visit  Medication Sig   Calcium Carb-Cholecalciferol 600-20 MG-MCG TABS Take 1 tablet by mouth 2 (two) times daily.   fluticasone  (FLONASE ) 50 MCG/ACT nasal spray Place 2 sprays into both nostrils daily.   [DISCONTINUED] D3-50 1.25 MG (50000 UT) capsule Take 50,000 Units by mouth once a week.   [DISCONTINUED] Magnesium  Oxide -Mg Supplement (MAG-OXIDE) 200 MG TABS Take 1 tablet (200 mg total) by mouth daily.   No facility-administered medications prior to visit.    ROS        Objective:     BP 123/83   Pulse 93    Ht 5' 4 (1.626 m)   Wt 198 lb (89.8 kg)   LMP 10/25/2011   SpO2 95%   BMI 33.99 kg/m     Physical Exam Constitutional:      Appearance: Normal appearance.  HENT:     Head: Normocephalic and atraumatic.     Right Ear: Tympanic membrane, ear canal and external ear normal.     Left Ear: Tympanic membrane, ear canal and external ear normal.     Nose: Nose normal.     Mouth/Throat:     Pharynx: Oropharynx is clear.   Eyes:     Extraocular Movements: Extraocular movements intact.     Conjunctiva/sclera: Conjunctivae normal.     Pupils: Pupils are equal, round, and reactive to light.   Neck:     Thyroid : No thyromegaly.   Cardiovascular:     Rate and Rhythm: Normal rate and regular rhythm.  Pulmonary:     Effort: Pulmonary effort is normal.     Breath sounds: Normal breath sounds.  Abdominal:     General: Bowel sounds are normal.     Palpations: Abdomen is soft.     Tenderness: There is no abdominal tenderness.   Musculoskeletal:  General: No swelling.     Cervical back: Neck supple.   Skin:    General: Skin is warm and dry.   Neurological:     Mental Status: She is oriented to person, place, and time.   Psychiatric:        Mood and Affect: Mood normal.        Behavior: Behavior normal.      Results for orders placed or performed in visit on 05/28/24  HM MAMMOGRAPHY  Result Value Ref Range   HM Mammogram 0-4 Bi-Rad 0-4 Bi-Rad, Self Reported Normal        Assessment & Plan:    Routine Health Maintenance and Physical Exam  Immunization History  Administered Date(s) Administered   Influenza-Unspecified 12/20/2017   PFIZER Comirnaty(Gray Top)Covid-19 Tri-Sucrose Vaccine 05/28/2021   PFIZER(Purple Top)SARS-COV-2 Vaccination 04/12/2021   Tdap 08/20/2011, 02/16/2022    Health Maintenance  Topic Date Due   Hepatitis B Vaccines (1 of 3 - 19+ 3-dose series) Never done   Zoster Vaccines- Shingrix (1 of 2) Never done   Cervical Cancer Screening (HPV/Pap  Cotest)  05/28/2022   COVID-19 Vaccine (3 - 2024-25 season) 07/30/2023   INFLUENZA VACCINE  06/28/2024   MAMMOGRAM  12/04/2025   Colonoscopy  09/05/2027   DTaP/Tdap/Td (3 - Td or Tdap) 02/17/2032   Hepatitis C Screening  Completed   HIV Screening  Completed   HPV VACCINES  Aged Out   Meningococcal B Vaccine  Aged Out    Discussed health benefits of physical activity, and encouraged her to engage in regular exercise appropriate for her age and condition.  Problem List Items Addressed This Visit   None Visit Diagnoses       Wellness examination    -  Primary   Relevant Orders   Lipid panel   CBC   Hemoglobin A1c   Fe+TIBC+Fer   TSH   CK (Creatine Kinase)   Magnesium      Muscle cramping       Relevant Orders   Lipid panel   CBC   Hemoglobin A1c   Fe+TIBC+Fer   TSH   CK (Creatine Kinase)   Magnesium      Abnormal glucose       Relevant Orders   Lipid panel   CBC   Hemoglobin A1c   Fe+TIBC+Fer   TSH   CK (Creatine Kinase)   Magnesium      Keep up a regular exercise program and make sure you are eating a healthy diet Try to eat 4 servings of dairy a day, or if you are lactose intolerant take a calcium with vitamin D  daily.  Your vaccines are up to date.   Muscle cramping - will check labs. Hydrate well. Work on gentle stretches.   Return if symptoms worsen or fail to improve.     Dorothyann Byars, MD

## 2024-05-28 NOTE — Patient Instructions (Signed)
 Can try 2-3mg  of melatonin about 1 hour before bedtime for 3 weeks and see if helpful. Try to cut out all caffeine .

## 2024-05-29 LAB — IRON,TIBC AND FERRITIN PANEL
Ferritin: 298 ng/mL — ABNORMAL HIGH (ref 15–150)
Iron Saturation: 26 % (ref 15–55)
Iron: 77 ug/dL (ref 27–159)
Total Iron Binding Capacity: 295 ug/dL (ref 250–450)
UIBC: 218 ug/dL (ref 131–425)

## 2024-05-29 LAB — CK: Total CK: 64 U/L (ref 32–182)

## 2024-05-29 LAB — LIPID PANEL
Chol/HDL Ratio: 3.7 ratio (ref 0.0–4.4)
Cholesterol, Total: 195 mg/dL (ref 100–199)
HDL: 53 mg/dL (ref >39)
LDL Chol Calc (NIH): 110 mg/dL — ABNORMAL HIGH (ref 0–99)
Triglycerides: 185 mg/dL — ABNORMAL HIGH (ref 0–149)
VLDL Cholesterol Cal: 32 mg/dL (ref 5–40)

## 2024-05-29 LAB — CBC
Hematocrit: 42.6 % (ref 34.0–46.6)
Hemoglobin: 13.7 g/dL (ref 11.1–15.9)
MCH: 30.2 pg (ref 26.6–33.0)
MCHC: 32.2 g/dL (ref 31.5–35.7)
MCV: 94 fL (ref 79–97)
Platelets: 181 10*3/uL (ref 150–450)
RBC: 4.53 x10E6/uL (ref 3.77–5.28)
RDW: 12.1 % (ref 11.7–15.4)
WBC: 5.8 10*3/uL (ref 3.4–10.8)

## 2024-05-29 LAB — HEMOGLOBIN A1C
Est. average glucose Bld gHb Est-mCnc: 114 mg/dL
Hgb A1c MFr Bld: 5.6 % (ref 4.8–5.6)

## 2024-05-29 LAB — MAGNESIUM: Magnesium: 2.2 mg/dL (ref 1.6–2.3)

## 2024-05-29 LAB — TSH: TSH: 0.94 u[IU]/mL (ref 0.450–4.500)

## 2024-05-30 ENCOUNTER — Ambulatory Visit: Payer: Self-pay | Admitting: Family Medicine

## 2024-05-30 NOTE — Progress Notes (Signed)
 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  Iron levels look great including iron stores.  Muscle enzyme is normal.  Magnesium  looks great.  Blood counts normal no sign of anemia.  Your A1c is under 5.7 so looks great.  Thyroid  level also at goal.

## 2024-07-10 ENCOUNTER — Encounter: Admitting: Family Medicine

## 2024-07-30 ENCOUNTER — Encounter: Payer: Self-pay | Admitting: Sports Medicine

## 2024-09-09 ENCOUNTER — Encounter: Payer: Self-pay | Admitting: Urgent Care

## 2024-09-09 ENCOUNTER — Ambulatory Visit: Admitting: Urgent Care

## 2024-09-09 VITALS — BP 118/78 | HR 104 | Ht 64.0 in | Wt 194.0 lb

## 2024-09-09 DIAGNOSIS — J069 Acute upper respiratory infection, unspecified: Secondary | ICD-10-CM | POA: Diagnosis not present

## 2024-09-09 LAB — POC COVID19/FLU A&B COMBO
Covid Antigen, POC: NEGATIVE
Influenza A Antigen, POC: NEGATIVE
Influenza B Antigen, POC: NEGATIVE

## 2024-09-09 LAB — POCT RAPID STREP A (OFFICE): Rapid Strep A Screen: NEGATIVE

## 2024-09-09 MED ORDER — HYDROCOD POLI-CHLORPHE POLI ER 10-8 MG/5ML PO SUER: 5.0000 mL | Freq: Two times a day (BID) | ORAL | 0 refills | Status: DC | PRN

## 2024-09-09 NOTE — Patient Instructions (Addendum)
 Your covid, flu and strep tests are all negative.  Your symptoms appear viral.  Please use salt water gargles and chloraseptic spray. Please also use a cool mist humidifier to help with your cough at night.  I have called in a cough medication to take up to twice daily. This medication can make you drowsy. If your symptoms persist >1 week, please return for recheck.

## 2024-09-09 NOTE — Progress Notes (Unsigned)
 Established Patient Office Visit  Subjective:  Patient ID: Laura Johnston, female    DOB: Aug 18, 1971  Age: 53 y.o. MRN: [id number]  Chief Complaint  Patient presents with   Cough    HPI  Discussed the use of AI scribe software for clinical note transcription with the patient, who gave verbal consent to proceed.  History of Present Illness   Laura Johnston is a 53 year old female who presents with sore throat and cough.  Symptoms began on Friday, two days after returning from a trip abroad. During the trip, she was not ill, nor was anyone else in her travel group. Her symptoms include a sore throat and a dry cough. No fever, sneezing, sinus pain, congestion, ear pain, shortness of breath, or chest tightness.  The sore throat is worse in the mornings, improves after drinking coffee, and feels 'so swollen'. She also mentions tasting a little blood when coughing.  She has been taking Robitussin since yesterday to manage her symptoms. No one else at home is sick, and her mother, who traveled with her, is also fine.       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   Class 1 obesity due to excess calories with serious comorbidity in adult 01/31/2018   Prediabetes 01/23/2018   Eye muscle twitches 01/22/2018   Palpitations 05/17/2017   Primary osteoarthritis of both knees 03/02/2017   Primary osteoarthritis of right knee 03/02/2017   Fatty liver disease, nonalcoholic 08/31/2016   Vitamin D  deficiency 07/20/2016   Hyperlipidemia 03/08/2010   DIZZINESS  02/11/2010   Past Medical History:  Diagnosis Date   Abnormal Pap smear    Allergy    mild    AMA (advanced maternal age) multigravida 35+    Arthritis    knees, back    GERD (gastroesophageal reflux disease)    Gestational diabetes    no meds - had gestational diabetes ONLY    Osteopenia    Prediabetes 01/23/2018   Vaginal delivery 08/19/2011   Past Surgical History:  Procedure Laterality Date   APPENDECTOMY     COLONOSCOPY  09/04/2020   KNEE SURGERY     LEEP     TUMOR REMOVAL     from buttocks   Social History   Tobacco Use   Smoking status: Former    Current packs/day: 0.00    Types: Cigarettes    Start date: 01/22/2014    Quit date: 01/23/2016    Years since quitting: 8.6   Smokeless tobacco: Never   Tobacco comments:    1-3 cig/day  Vaping Use   Vaping status: Never Used  Substance Use Topics   Alcohol use: No   Drug use: No      ROS: as noted in HPI  Objective:     BP 118/78   Pulse (!) 104   Ht 5' 4 (1.626 m)   Wt 194 lb (88 kg)   LMP 10/25/2011   SpO2 98%   BMI 33.30 kg/m  BP Readings from Last 3 Encounters:  09/09/24 118/78  05/28/24 123/83  09/07/23 107/75   Wt Readings  from Last 3 Encounters:  09/09/24 194 lb (88 kg)  05/28/24 198 lb (89.8 kg)  09/07/23 190 lb (86.2 kg)      Physical Exam Vitals and nursing note reviewed.  Constitutional:      General: She is not in acute distress.    Appearance: Normal appearance. She is not ill-appearing, toxic-appearing or diaphoretic.  HENT:     Head: Normocephalic and atraumatic.     Salivary Glands: Right salivary gland is not diffusely enlarged or tender. Left salivary gland is not diffusely enlarged or tender.     Right Ear: Tympanic membrane, ear canal and external ear normal. No drainage or swelling. There is no impacted cerumen. Tympanic membrane is not scarred, perforated or erythematous.     Left Ear: Tympanic membrane, ear canal and external ear normal. No drainage or swelling.  There is no impacted cerumen. Tympanic membrane is not scarred, perforated or erythematous.     Nose: Nose normal.     Right Sinus: No maxillary sinus tenderness or frontal sinus tenderness.     Left Sinus: No maxillary sinus tenderness or frontal sinus tenderness.     Mouth/Throat:     Mouth: Mucous membranes are moist.     Pharynx: Oropharynx is clear. Uvula midline. No posterior oropharyngeal erythema or uvula swelling.     Tonsils: Tonsillar exudate present. 2+ on the right. 2+ on the left.  Eyes:     General: No scleral icterus.       Right eye: No discharge.        Left eye: No discharge.     Extraocular Movements: Extraocular movements intact.     Pupils: Pupils are equal, round, and reactive to light.  Cardiovascular:     Rate and Rhythm: Regular rhythm. Tachycardia present.  Pulmonary:     Effort: Pulmonary effort is normal. No respiratory distress.     Breath sounds: Normal breath sounds. No stridor. No wheezing or rhonchi.  Musculoskeletal:     Cervical back: Normal range of motion and neck supple. No rigidity or tenderness.  Lymphadenopathy:     Cervical: Cervical adenopathy (mild B submandibular) present.  Skin:    General: Skin is warm and dry.     Coloration: Skin is not jaundiced.     Findings: No bruising, erythema or rash.  Neurological:     General: No focal deficit present.     Mental Status: She is alert and oriented to person, place, and time.      Results for orders placed or performed in visit on 09/09/24  POCT rapid strep A  Result Value Ref Range   Rapid Strep A Screen Negative Negative  POC Covid19/Flu A&B Antigen  Result Value Ref Range   Influenza A Antigen, POC Negative Negative   Influenza B Antigen, POC Negative Negative   Covid Antigen, POC Negative Negative      The 10-year ASCVD risk score (Arnett DK, et al., 2019) is: 1.6%  Assessment & Plan:  Viral upper respiratory tract infection -     POCT rapid strep A -     POC Covid19/Flu  A&B Antigen -     Hydrocod Poli-Chlorphe Poli ER; Take 5 mLs by mouth every 12 (twelve) hours as needed for cough (cough, will cause drowsiness.).  Dispense: 115 mL; Refill: 0  Assessment and Plan    Acute URI, likely viral Sore throat post-travel, swollen tonsils, cough.  - Swab for streptococcal pharyngitis - negative - Flu and COVID test performed -  negative - supportive measures - cough medication as needed, RTC precautions discussed          No follow-ups on file.   Benton LITTIE Gave, PA

## 2024-09-11 ENCOUNTER — Ambulatory Visit: Admitting: Family Medicine

## 2024-09-13 ENCOUNTER — Ambulatory Visit: Admitting: Family

## 2024-09-13 ENCOUNTER — Ambulatory Visit: Payer: Self-pay

## 2024-09-13 VITALS — BP 110/82 | HR 100 | Temp 98.1°F | Resp 18 | Ht 64.0 in | Wt 194.0 lb

## 2024-09-13 DIAGNOSIS — J019 Acute sinusitis, unspecified: Secondary | ICD-10-CM | POA: Diagnosis not present

## 2024-09-13 DIAGNOSIS — B9689 Other specified bacterial agents as the cause of diseases classified elsewhere: Secondary | ICD-10-CM | POA: Diagnosis not present

## 2024-09-13 DIAGNOSIS — R051 Acute cough: Secondary | ICD-10-CM

## 2024-09-13 MED ORDER — LEVOCETIRIZINE DIHYDROCHLORIDE 5 MG PO TABS: 5.0000 mg | ORAL_TABLET | Freq: Every evening | ORAL | 0 refills | Status: AC

## 2024-09-13 MED ORDER — AMOXICILLIN-POT CLAVULANATE 875-125 MG PO TABS: 1.0000 | ORAL_TABLET | Freq: Two times a day (BID) | ORAL | 0 refills | Status: DC

## 2024-09-13 NOTE — Telephone Encounter (Signed)
 FYI Only or Action Required?: FYI only for provider.  Patient was last seen in primary care on 09/09/2024 by Lowella Benton CROME, PA.  Called Nurse Triage reporting Cough.  Symptoms began a week ago.  Interventions attempted: Other: Seen in office prescribed cough medicine.  Symptoms are: slowly worsening  Triage Disposition: See Physician Within 24 Hours  Patient/caregiver understands and will follow disposition?: Yes - Appt scheduled for this morning.                      Copied from CRM [id number]. Topic: Clinical - Red Word Triage >> Sep 13, 2024  7:43 AM Laura Johnston wrote: Red Word that prompted transfer to Nurse Triage: Patient is calling to report sore throat, temperature 101.9, green mucus  Seen in office 09/09/2024 for Viral Upper Respiroratory. Not getting better.   Medication chlorpheniramine-HYDROcodone  (TUSSIONEX) 10-8 MG/5ML [[id number]] not helping. Reason for Disposition  [1] Continuous (nonstop) coughing interferes with work or school AND [2] no improvement using cough treatment per Care Advice  Answer Assessment - Initial Assessment Questions 1. ONSET: When did the cough begin?      Before the 13th 2. SEVERITY: How bad is the cough today?      Moderate 3. SPUTUM: Describe the color of your sputum (e.g., none, dry cough; clear, white, yellow, green)     No - light green 4. HEMOPTYSIS: Are you coughing up any blood? If Yes, ask: How much? (e.g., flecks, streaks, tablespoons, etc.)     No  can taste blood sometimes 5. DIFFICULTY BREATHING: Are you having difficulty breathing? If Yes, ask: How bad is it? (e.g., mild, moderate, severe)      non 6. FEVER: Do you have a fever? If Yes, ask: What is your temperature, how was it measured, and when did it start?     Last night was 101.9 7. CARDIAC HISTORY: Do you have any history of heart disease? (e.g., heart attack, congestive heart failure)      no 8. LUNG HISTORY: Do you have any  history of lung disease?  (e.g., pulmonary embolus, asthma, emphysema)     no  10. OTHER SYMPTOMS: Do you have any other symptoms? (e.g., runny nose, wheezing, chest pain)       Sinus congestion, pain in chest from coughing, tired, coughed so much she threw up.  Protocols used: Cough - Acute Non-Productive-A-AH

## 2024-09-15 ENCOUNTER — Encounter: Payer: Self-pay | Admitting: Family

## 2024-09-15 NOTE — Progress Notes (Signed)
 Acute Office Visit  Subjective:     Patient ID: Laura Johnston, female    DOB: [birth year], 53 y.o.   MRN: [id number]  Chief Complaint  Patient presents with  . Follow-up    Cough worsening     HPI Patient is in today with c/o cough that is productive with dark green phlegm, congestion, x 1 week. Was seen on 09/09/24 with similar symptoms and prescribed cough medication that she does not feel helped. Denies any fever of chills.   Review of Systems  Constitutional: Negative.   HENT:  Positive for congestion and sinus pain.   Respiratory:  Positive for cough.   Cardiovascular: Negative.   Musculoskeletal: Negative.   Neurological: Negative.   Psychiatric/Behavioral: Negative.      Past Medical History:  Diagnosis Date  . Abnormal Pap smear   . Allergy    mild   . AMA (advanced maternal age) multigravida 35+   . Arthritis    knees, back   . GERD (gastroesophageal reflux disease)   . Gestational diabetes    no meds - had gestational diabetes ONLY   . Osteopenia   . Prediabetes 01/23/2018  . Vaginal delivery 08/19/2011    Social History   Socioeconomic History  . Marital status: Married    Spouse name: Not on file  . Number of children: Not on file  . Years of education: Not on file  . Highest education level: Not on file  Occupational History  . Not on file  Tobacco Use  . Smoking status: Former    Current packs/day: 0.00    Types: Cigarettes    Start date: 01/22/2014    Quit date: 01/23/2016    Years since quitting: 8.6  . Smokeless tobacco: Never  . Tobacco comments:    1-3 cig/day  Vaping Use  . Vaping status: Never Used  Substance and Sexual Activity  . Alcohol use: No  . Drug use: No  . Sexual activity: Never  Other Topics Concern  . Not on file  Social History Narrative  . Not on file   Social Drivers of Health   Financial Resource Strain: Low Risk  (05/28/2024)   Overall Financial Resource Strain (CARDIA)   . Difficulty of Paying Living  Expenses: Not very hard  Food Insecurity: No Food Insecurity (05/28/2024)   Hunger Vital Sign   . Worried About Programme researcher, broadcasting/film/video in the Last Year: Never true   . Ran Out of Food in the Last Year: Never true  Transportation Needs: No Transportation Needs (05/28/2024)   PRAPARE - Transportation   . Lack of Transportation (Medical): No   . Lack of Transportation (Non-Medical): No  Physical Activity: Inactive (05/28/2024)   Exercise Vital Sign   . Days of Exercise per Week: 0 days   . Minutes of Exercise per Session: 0 min  Stress: No Stress Concern Present (05/28/2024)   [employer] of Occupational Health - Occupational Stress Questionnaire   . Feeling of Stress: Only a little  Social Connections: Socially Integrated (05/28/2024)   Social Connection and Isolation Panel   . Frequency of Communication with Friends and Family: Three times a week   . Frequency of Social Gatherings with Friends and Family: Three times a week   . Attends Religious Services: More than 4 times per year   . Active Member of Clubs or Organizations: Yes   . Attends Banker Meetings: More than 4 times per year   . Marital Status:  Married  Intimate Partner Violence: Not At Risk (05/28/2024)   Humiliation, Afraid, Rape, and Kick questionnaire   . Fear of Current or Ex-Partner: No   . Emotionally Abused: No   . Physically Abused: No   . Sexually Abused: No    Past Surgical History:  Procedure Laterality Date  . APPENDECTOMY    . COLONOSCOPY  09/04/2020  . KNEE SURGERY    . LEEP    . TUMOR REMOVAL     from buttocks    Family History  Problem Relation Age of Onset  . Stroke Father   . Hypertension Father   . Hyperlipidemia Father   . Heart disease Father   . Diabetes Maternal Uncle   . Stroke Paternal Grandmother   . Colon polyps Mother   . Colon cancer Neg Hx   . Esophageal cancer Neg Hx   . Rectal cancer Neg Hx   . Stomach cancer Neg Hx     Allergies  Allergen Reactions  . Tape      Other reaction(s): Redness  . Cortisone   . Hydrocortisone    . Meloxicam   . Meloxicam     Paralysis  . Pseudoephedrine     REACTION: numbness to fingers/tongue    Current Outpatient Medications on File Prior to Visit  Medication Sig Dispense Refill  . Calcium Carb-Cholecalciferol 600-20 MG-MCG TABS Take 1 tablet by mouth 2 (two) times daily.    . chlorpheniramine-HYDROcodone  (TUSSIONEX) 10-8 MG/5ML Take 5 mLs by mouth every 12 (twelve) hours as needed for cough (cough, will cause drowsiness.). 115 mL 0  . fluticasone  (FLONASE ) 50 MCG/ACT nasal spray Place 2 sprays into both nostrils daily. (Patient not taking: Reported on 09/09/2024) 16 g 1   No current facility-administered medications on file prior to visit.    BP 110/82   Pulse 100   Temp 98.1 F (36.7 C)   Resp 18   Ht 5' 4 (1.626 m)   Wt 194 lb (88 kg)   LMP 10/25/2011   SpO2 95%   BMI 33.30 kg/m chart     Objective:    BP 110/82   Pulse 100   Temp 98.1 F (36.7 C)   Resp 18   Ht 5' 4 (1.626 m)   Wt 194 lb (88 kg)   LMP 10/25/2011   SpO2 95%   BMI 33.30 kg/m    Physical Exam Vitals and nursing note reviewed.  Constitutional:      Appearance: She is normal weight.  HENT:     Right Ear: Tympanic membrane, ear canal and external ear normal.     Left Ear: Tympanic membrane, ear canal and external ear normal.     Mouth/Throat:     Mouth: Mucous membranes are moist.  Cardiovascular:     Rate and Rhythm: Normal rate and regular rhythm.  Pulmonary:     Effort: Pulmonary effort is normal.     Breath sounds: Normal breath sounds.  Musculoskeletal:        General: Normal range of motion.     Cervical back: Normal range of motion and neck supple.  Skin:    General: Skin is warm and dry.  Neurological:     General: No focal deficit present.     Mental Status: She is alert and oriented to person, place, and time. Mental status is at baseline.  Psychiatric:        Mood and Affect: Mood normal.         Behavior:  Behavior normal.        Thought Content: Thought content normal.    No results found for any visits on 09/13/24.      Assessment & Plan:   Problem List Items Addressed This Visit   None Visit Diagnoses       Acute bacterial sinusitis    -  Primary   Relevant Medications   levocetirizine (XYZAL) 5 MG tablet   amoxicillin -clavulanate (AUGMENTIN ) 875-125 MG tablet     Acute cough           Meds ordered this encounter  Medications  . levocetirizine (XYZAL) 5 MG tablet    Sig: Take 1 tablet (5 mg total) by mouth every evening.    Dispense:  30 tablet    Refill:  0  . amoxicillin -clavulanate (AUGMENTIN ) 875-125 MG tablet    Sig: Take 1 tablet by mouth 2 (two) times daily.    Dispense:  20 tablet    Refill:  0   Call the office if symptoms worsen or persist. Recheck as scheduled and sooner as needed.  No follow-ups on file.  Ebany Bowermaster B Selena Swaminathan, FNP

## 2024-09-16 ENCOUNTER — Encounter: Payer: Self-pay | Admitting: Family Medicine

## 2024-09-26 ENCOUNTER — Encounter: Payer: Self-pay | Admitting: Family Medicine

## 2024-09-26 ENCOUNTER — Ambulatory Visit: Admitting: Family Medicine

## 2024-09-26 VITALS — BP 110/75 | HR 90 | Temp 98.2°F | Ht 64.0 in | Wt 193.0 lb

## 2024-09-26 DIAGNOSIS — J029 Acute pharyngitis, unspecified: Secondary | ICD-10-CM | POA: Insufficient documentation

## 2024-09-26 MED ORDER — PREDNISONE 10 MG (21) PO TBPK: ORAL_TABLET | ORAL | 0 refills | Status: DC

## 2024-09-26 NOTE — Assessment & Plan Note (Signed)
 Continued pharyngitis.  Adding course of prednisone  to see if this is helpful for swelling and inflammation.  Checking CBC and Epstein-Barr serology.

## 2024-09-26 NOTE — Progress Notes (Signed)
 Laura Johnston - 53 y.o. female MRN [id number]  Date of birth: 04-Jul-1971  Subjective Chief Complaint  Patient presents with   Sore Throat   Cough    HPI Laura Johnston is a 53 year old female here today with complaint of sore throat.  Seen a couple weeks ago in our clinic and diagnosed with URI.  Tested negative for COVID and flu as well as strep.  Treated conservatively with over-the-counter medications and prescription cough syrup.  She was seen again on 10/17 due to continued symptoms.  Xyzal and Augmentin  added.  Continues to have symptoms specially sore throat.  Denies sinus pain, wheezing or shortness of breath.  No recent fevers or chills.  She is drinking plenty of fluids.  She is using humidifier at home.  ROS:  A comprehensive ROS was completed and negative except as noted per HPI  Allergies  Allergen Reactions   Tape     Other reaction(s): Redness   Cortisone    Hydrocortisone     Meloxicam    Meloxicam     Paralysis   Pseudoephedrine     REACTION: numbness to fingers/tongue    Past Medical History:  Diagnosis Date   Abnormal Pap smear    Allergy    mild    AMA (advanced maternal age) multigravida 35+    Arthritis    knees, back    GERD (gastroesophageal reflux disease)    Gestational diabetes    no meds - had gestational diabetes ONLY    Osteopenia    Prediabetes 01/23/2018   Vaginal delivery 08/19/2011    Past Surgical History:  Procedure Laterality Date   APPENDECTOMY     COLONOSCOPY  09/04/2020   KNEE SURGERY     LEEP     TUMOR REMOVAL     from buttocks    Social History   Socioeconomic History   Marital status: Married    Spouse name: Not on file   Number of children: Not on file   Years of education: Not on file   Highest education level: Not on file  Occupational History   Not on file  Tobacco Use   Smoking status: Former    Current packs/day: 0.00    Types: Cigarettes    Start date: 01/22/2014    Quit date: 01/23/2016     Years since quitting: 8.6   Smokeless tobacco: Never   Tobacco comments:    1-3 cig/day  Vaping Use   Vaping status: Never Used  Substance and Sexual Activity   Alcohol use: No   Drug use: No   Sexual activity: Never  Other Topics Concern   Not on file  Social History Narrative   Not on file   Social Drivers of Health   Financial Resource Strain: Low Risk  (05/28/2024)   Overall Financial Resource Strain (CARDIA)    Difficulty of Paying Living Expenses: Not very hard  Food Insecurity: No Food Insecurity (05/28/2024)   Hunger Vital Sign    Worried About Running Out of Food in the Last Year: Never true    Ran Out of Food in the Last Year: Never true  Transportation Needs: No Transportation Needs (05/28/2024)   PRAPARE - Administrator, Civil Service (Medical): No    Lack of Transportation (Non-Medical): No  Physical Activity: Inactive (05/28/2024)   Exercise Vital Sign    Days of Exercise per Week: 0 days    Minutes of Exercise per Session: 0 min  Stress: No Stress Concern  Present (05/28/2024)   [employer] of Occupational Health - Occupational Stress Questionnaire    Feeling of Stress: Only a little  Social Connections: Socially Integrated (05/28/2024)   Social Connection and Isolation Panel    Frequency of Communication with Friends and Family: Three times a week    Frequency of Social Gatherings with Friends and Family: Three times a week    Attends Religious Services: More than 4 times per year    Active Member of Clubs or Organizations: Yes    Attends Engineer, Structural: More than 4 times per year    Marital Status: Married    Family History  Problem Relation Age of Onset   Stroke Father    Hypertension Father    Hyperlipidemia Father    Heart disease Father    Diabetes Maternal Uncle    Stroke Paternal Grandmother    Colon polyps Mother    Colon cancer Neg Hx    Esophageal cancer Neg Hx    Rectal cancer Neg Hx    Stomach cancer Neg Hx      Health Maintenance  Topic Date Due   Hepatitis B Vaccines 19-59 Average Risk (1 of 3 - 19+ 3-dose series) Never done   Pneumococcal Vaccine: 50+ Years (1 of 1 - PCV) Never done   Zoster Vaccines- Shingrix (1 of 2) Never done   COVID-19 Vaccine (3 - 2025-26 season) 07/29/2024   Influenza Vaccine  02/25/2025 (Originally 06/28/2024)   Mammogram  12/04/2025   Cervical Cancer Screening (HPV/Pap Cotest)  12/06/2026   Colonoscopy  09/05/2027   DTaP/Tdap/Td (3 - Td or Tdap) 02/17/2032   Hepatitis C Screening  Completed   HIV Screening  Completed   HPV VACCINES  Aged Out   Meningococcal B Vaccine  Aged Out     ----------------------------------------------------------------------------------------------------------------------------------------------------------------------------------------------------------------- Physical Exam BP 110/75 (BP Location: Left Arm, Patient Position: Sitting, Cuff Size: Normal)   Pulse 90   Temp 98.2 F (36.8 C) (Oral)   Ht 5' 4 (1.626 m)   Wt 193 lb (87.5 kg)   LMP 10/25/2011   SpO2 99%   BMI 33.13 kg/m   Physical Exam Constitutional:      Appearance: She is well-developed.  HENT:     Head: Normocephalic and atraumatic.     Mouth/Throat:     Comments: Posterior oropharyngeal erythema with 1+ tonsils. Eyes:     General: No scleral icterus. Cardiovascular:     Rate and Rhythm: Normal rate and regular rhythm.  Pulmonary:     Effort: Pulmonary effort is normal.     Breath sounds: Normal breath sounds.  Musculoskeletal:     Cervical back: Neck supple.  Lymphadenopathy:     Cervical: Cervical adenopathy present.  Neurological:     Mental Status: She is alert.  Psychiatric:        Mood and Affect: Mood normal.        Behavior: Behavior normal.      ------------------------------------------------------------------------------------------------------------------------------------------------------------------------------------------------------------------- Assessment and Plan  Pharyngitis Continued pharyngitis.  Adding course of prednisone  to see if this is helpful for swelling and inflammation.  Checking CBC and Epstein-Barr serology.   Meds ordered this encounter  Medications   predniSONE  (STERAPRED UNI-PAK 21 TAB) 10 MG (21) TBPK tablet    Sig: Taper as directed on packaging.    Dispense:  21 tablet    Refill:  0    No follow-ups on file.

## 2024-09-26 NOTE — Patient Instructions (Signed)
 Use humidifier at home Stay well hydrated   Pharyngitis  Pharyngitis is a sore throat (pharynx). This is when there is redness, pain, and swelling in your throat. Most of the time, this condition gets better on its own. In some cases, you may need medicine. What are the causes? An infection from a virus. An infection from bacteria. Allergies. What increases the risk? Being 50-54 years old. Being in crowded environments. These include: Daycares. Schools. Dormitories. Living in a place with cold temperatures outside. Having a weakened disease-fighting (immune) system. What are the signs or symptoms? Symptoms may vary depending on the cause. Common symptoms include: Sore throat. Tiredness (fatigue). Low-grade fever. Stuffy nose. Cough. Headache. Other symptoms may include: Glands in the neck (lymph nodes) that are swollen. Skin rashes. Film on the throat or tonsils. This can be caused by an infection from bacteria. Vomiting. Red, itchy eyes. Loss of appetite. Joint pain and muscle aches. Tonsils that are temporarily bigger than usual (enlarged). How is this treated? Many times, treatment is not needed. This condition usually gets better in 3-4 days without treatment. If the infection is caused by a bacteria, you may be need to take antibiotics. Follow these instructions at home: Medicines Take over-the-counter and prescription medicines only as told by your doctor. If you were prescribed an antibiotic medicine, take it as told by your doctor. Do not stop taking the antibiotic even if you start to feel better. Use throat lozenges or sprays to soothe your throat as told by your doctor. Children can get pharyngitis. Do not give your child aspirin. Managing pain To help with pain, try: Sipping warm liquids, such as: Broth. Herbal tea. Warm water. Eating or drinking cold or frozen liquids, such as frozen ice pops. Rinsing your mouth (gargle) with a salt water mixture 3-4  times a day or as needed. To make salt water, dissolve -1 tsp (3-6 g) of salt in 1 cup (237 mL) of warm water. Do not swallow this mixture. Sucking on hard candy or throat lozenges. Putting a cool-mist humidifier in your bedroom at night to moisten the air. Sitting in the bathroom with the door closed for 5-10 minutes while you run hot water in the shower.  General instructions  Do not smoke or use any products that contain nicotine or tobacco. If you need help quitting, ask your doctor. Rest as told by your doctor. Drink enough fluid to keep your pee (urine) pale yellow. How is this prevented? Wash your hands often for at least 20 seconds with soap and water. If soap and water are not available, use hand sanitizer. Do not touch your eyes, nose, or mouth with unwashed hands. Wash hands after touching these areas. Do not share cups or eating utensils. Avoid close contact with people who are sick. Contact a doctor if: You have large, tender lumps in your neck. You have a rash. You cough up green, yellow-brown, or bloody spit. Get help right away if: You have a stiff neck. You drool or cannot swallow liquids. You cannot drink or take medicines without vomiting. You have very bad pain that does not go away with medicine. You have problems breathing, and it is not from a stuffy nose. You have new pain and swelling in your knees, ankles, wrists, or elbows. These symptoms may be an emergency. Get help right away. Call your local emergency services (911 in the [place] Do not wait to see if the symptoms will go away. Do not drive yourself to the  hospital. Summary Pharyngitis is a sore throat (pharynx). This is when there is redness, pain, and swelling in your throat. Most of the time, pharyngitis gets better on its own. Sometimes, you may need medicine. If you were prescribed an antibiotic medicine, take it as told by your doctor. Do not stop taking the antibiotic even if you start to feel  better. This information is not intended to replace advice given to you by your health care provider. Make sure you discuss any questions you have with your health care provider. Document Revised: 02/10/2021 Document Reviewed: 02/10/2021 Elsevier Patient Education  2024 [employer].

## 2024-09-27 LAB — EPSTEIN-BARR VIRUS (EBV) ANTIBODY PROFILE
EBV NA IgG: 485 U/mL — ABNORMAL HIGH (ref 0.0–17.9)
EBV VCA IgG: 56.7 U/mL — ABNORMAL HIGH (ref 0.0–17.9)
EBV VCA IgM: <36 U/mL (ref 0.0–35.9)

## 2024-09-27 LAB — CBC WITH DIFFERENTIAL/PLATELET
Basophils Absolute: 0.1 x10E3/uL (ref 0.0–0.2)
Basos: 2 %
EOS (ABSOLUTE): 0.3 x10E3/uL (ref 0.0–0.4)
Eos: 5 %
Hematocrit: 40.9 % (ref 34.0–46.6)
Hemoglobin: 13.4 g/dL (ref 11.1–15.9)
Immature Grans (Abs): 0 x10E3/uL (ref 0.0–0.1)
Immature Granulocytes: 0 %
Lymphocytes Absolute: 2.2 x10E3/uL (ref 0.7–3.1)
Lymphs: 39 %
MCH: 30.2 pg (ref 26.6–33.0)
MCHC: 32.8 g/dL (ref 31.5–35.7)
MCV: 92 fL (ref 79–97)
Monocytes Absolute: 0.4 x10E3/uL (ref 0.1–0.9)
Monocytes: 8 %
Neutrophils Absolute: 2.6 x10E3/uL (ref 1.4–7.0)
Neutrophils: 46 %
Platelets: 204 x10E3/uL (ref 150–450)
RBC: 4.43 x10E6/uL (ref 3.77–5.28)
RDW: 11.8 % (ref 11.7–15.4)
WBC: 5.7 x10E3/uL (ref 3.4–10.8)

## 2024-10-04 ENCOUNTER — Ambulatory Visit: Payer: Self-pay | Admitting: Family Medicine

## 2024-10-04 DIAGNOSIS — R051 Acute cough: Secondary | ICD-10-CM

## 2024-10-07 MED ORDER — GUAIFENESIN-CODEINE 100-10 MG/5ML PO SOLN: 5.0000 mL | Freq: Two times a day (BID) | ORAL | 0 refills | Status: DC | PRN

## 2024-10-07 NOTE — Telephone Encounter (Signed)
 Meds ordered this encounter  Medications   guaiFENesin -codeine  100-10 MG/5ML syrup    Sig: Take 5 mLs by mouth 2 (two) times daily as needed for cough.    Dispense:  118 mL    Refill:  0   See prior note to about running humidifier keeping throat moist with lozenges, also recommend nasal saline rinses to break up and irrigate the mucus.

## 2024-12-21 ENCOUNTER — Encounter: Payer: Self-pay | Admitting: Family Medicine

## 2024-12-21 ENCOUNTER — Other Ambulatory Visit: Payer: Self-pay | Admitting: Family Medicine

## 2024-12-21 NOTE — Progress Notes (Signed)
 error
